# Patient Record
Sex: Female | Born: 1985 | Race: White | Hispanic: No | Marital: Married | Smoking: Former smoker
Health system: Southern US, Community
[De-identification: ages and names within clinical notes are randomized; demographics above are authoritative.]

## PROBLEM LIST (undated history)

## (undated) DIAGNOSIS — M2391 Unspecified internal derangement of right knee: Secondary | ICD-10-CM

## (undated) DIAGNOSIS — R112 Nausea with vomiting, unspecified: Secondary | ICD-10-CM

## (undated) DIAGNOSIS — F32A Depression, unspecified: Secondary | ICD-10-CM

## (undated) DIAGNOSIS — Z8661 Personal history of infections of the central nervous system: Secondary | ICD-10-CM

## (undated) DIAGNOSIS — Z8619 Personal history of other infectious and parasitic diseases: Secondary | ICD-10-CM

## (undated) DIAGNOSIS — H933X9 Disorders of unspecified acoustic nerve: Secondary | ICD-10-CM

## (undated) DIAGNOSIS — H903 Sensorineural hearing loss, bilateral: Secondary | ICD-10-CM

## (undated) DIAGNOSIS — J302 Other seasonal allergic rhinitis: Secondary | ICD-10-CM

## (undated) DIAGNOSIS — Z9889 Other specified postprocedural states: Secondary | ICD-10-CM

## (undated) DIAGNOSIS — H905 Unspecified sensorineural hearing loss: Secondary | ICD-10-CM

## (undated) DIAGNOSIS — O139 Gestational [pregnancy-induced] hypertension without significant proteinuria, unspecified trimester: Secondary | ICD-10-CM

## (undated) DIAGNOSIS — F329 Major depressive disorder, single episode, unspecified: Secondary | ICD-10-CM

## (undated) DIAGNOSIS — T7840XA Allergy, unspecified, initial encounter: Secondary | ICD-10-CM

## (undated) DIAGNOSIS — Z9621 Cochlear implant status: Secondary | ICD-10-CM

## (undated) HISTORY — PX: COCHLEAR IMPLANT: SUR684

## (undated) HISTORY — DX: Major depressive disorder, single episode, unspecified: F32.9

## (undated) HISTORY — DX: Depression, unspecified: F32.A

## (undated) HISTORY — DX: Allergy, unspecified, initial encounter: T78.40XA

## (undated) HISTORY — PX: WISDOM TOOTH EXTRACTION: SHX21

## (undated) HISTORY — DX: Personal history of infections of the central nervous system: Z86.61

## (undated) HISTORY — DX: Gestational (pregnancy-induced) hypertension without significant proteinuria, unspecified trimester: O13.9

## (undated) HISTORY — PX: OTHER SURGICAL HISTORY: SHX169

---
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
## 2007-02-03 ENCOUNTER — Ambulatory Visit: Payer: Self-pay | Admitting: Gastroenterology

## 2007-02-04 ENCOUNTER — Ambulatory Visit: Payer: Self-pay | Admitting: Gastroenterology

## 2007-02-04 ENCOUNTER — Ambulatory Visit (HOSPITAL_COMMUNITY): Admission: RE | Admit: 2007-02-04 | Discharge: 2007-02-04 | Payer: Self-pay | Admitting: Gastroenterology

## 2007-02-04 ENCOUNTER — Encounter (INDEPENDENT_AMBULATORY_CARE_PROVIDER_SITE_OTHER): Payer: Self-pay | Admitting: Specialist

## 2007-02-17 ENCOUNTER — Encounter (HOSPITAL_COMMUNITY): Admission: RE | Admit: 2007-02-17 | Discharge: 2007-03-19 | Payer: Self-pay | Admitting: Gastroenterology

## 2011-04-13 NOTE — Op Note (Signed)
Patricia Morse            ACCOUNT NO.:  [id number]   MEDICAL RECORD NO.:  [id number]          PATIENT TYPE:  AMB   LOCATION:  SDS                          FACILITY:  [facility]   PHYSICIAN:  Carolan Shiver, M.D.    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  She underwent middle  ear explorations and sealing of her oval and round windows bilaterally  during early childhood.  This seemed to stop the progression of the loss.  She originally wore hearing aids but failed and as she became older, it  became more clear that she actually was suffering from auditory neuropathy.  CT scanning of her temporal bones documented well-pneumatized temporal bones  bilaterally, and she was recommended for a multichannel cochlear implant.  The parents were uncertain about this, took a long time to decide and  eventually decided to proceed with a left cochlear implant.  Vestibular  function was tested and was found to be symmetric, approximately 30 degrees  maximum slow wave velocity bilaterally.  EBRT documented dyssynchronous wave  form patterns consistent with auditory neuropathy.  She underwent extensive  counseling preoperatively and Pneumovax vaccine on November 14, 2005, in  preparation for the procedure.   Risks and complications of multichannel cochlear implantation were explained  to [facility] and her parents, questions were invited and answered, and  informed consent was signed and witnessed.   JUSTIFICATION FOR OUTPATIENT SETTING:  This patient's' age and need for  general endotracheal anesthesia.   JUSTIFICATION FOR OVERNIGHT STAY:  1.  Twenty-three hours of observation of facial function status post      cochlear implant via a facial recess tympanomastoidectomy.  2.  IV hydration and pain control.   PREOPERATIVE DIAGNOSIS:  Severe bilateral sensorineural hearing loss  secondary to auditory neuropathy.   POSTOPERATIVE DIAGNOSIS:  Severe bilateral sensorineural hearing loss  secondary to auditory neuropathy.   OPERATION:  Clarion high-resolution 90K multichannel cochlear implant, left  ear (1-J electrode).   SURGEON:  Carolan Shiver, M.D.   ANESTHESIA:  General endotracheal, [place], M.D.   COMPLICATIONS:  None.   SUMMARY OF PROCEDURE:  After the patient was taken to the operating room,  she was placed in the supine position and a time-out was performed.  She was  then masked to sleep by general anesthesia without difficulty.  An IV was  begun and she was orally intubated, eyelids were taped shut, and she was  properly positioned and monitored.  Elbows and ankles were padded with foam  rubber.  A Foley catheter was inserted.  A pillow was placed beneath her  knees.  PAS stockings were applied, and her heels were elevated off the bed.  Hair in the left temporoparietal scalp area was then clipped and her hair  was taped and a stockinette cap was applied.  Her left external ear and  hemiface were then prepped with alcohol.  An inverted J cochlear implant  incision was then marked and infiltrated with 9 mL of 1% Xylocaine with  1:100,000 epinephrine.  Using a __________, the place for the internal  receiver was marked.  Several wire needle electrodes were then inserted into  the left orbicularis oris and oculi muscles, suprasternal notch and left  shoulder, and connected to a NIMS facial nerve monitor preamplifier.  The  patient's left ear and hemiface were then prepped with Betadine and draped  in the standard fashion for a  multichannel cochlear implant.   The inverted J incision was then created to phase this using a 15 blade.  A  scalp flap was elevated.  An anteriorly-based Palva flap was then elevated  and the mastoid cortex and posterior edge of the bony canal wall were  exposed.  Self-retaining retractors were placed.  The left mastoid was then  opened using cutting burs using continuous suction irrigation.  A complete  mastoid procedure was then performed, the antrum was opened, and the incus  was identified in the fossa incudis, along with the dome of the lateral  semicircular canal.  Digastric periosteum was identified in the digastric  tip.   The facial recess was then opened with a 2 mm cutting bur and a 1 mm diamond  bur.  The recess was found to be well-pneumatized.  Upon entering the  recess, however, I found that the tympanic membrane was actually medial to  the facial recess and, in fact, was at the level of the facial nerve.  The  tympanic membrane was then elevated and the middle ear was entered without  difficulty.  Scar tissue and adhesions around the incus and stapes were  lysed with a 5910 Beaver blade.  The patient was found to have almost no  identifiable round window niche.  There was a small dimple in the area where  the round window should have been.  A 1.5 mm diamond bur was then used to  create a cochleostomy.  The cochlea was entered without difficulty.  I could  see around to the first turn.  The cochleostomy was opened to the size of a  normal sizer for a 1-J electrode.  The cochleostomy was then packed with a  quarter cottonoid.   Attention was then turned to the skull, where a bony well was drilled  posterior to the mastoid cavity.  The well was drilled 3 mm deep and then a  trough was drilled between the well and the posterior mastoid cavity.  The  mastoid cavity lip was then undercut with cutting burrs.  All the bone was then smoothed.  The area was then copiously  irrigated with bacitracin-  containing saline.   A dummy internal receiver was then placed and two sets of holes were then  drilled on either side of the bony well for later suturing.  The dummy  internal receiver was then removed.  All bleeding was then controlled with  unipolar cautery.  The area was copiously irrigated with bacitracin-  containing saline.   A 5-0 Ethilon suture was then placed through the two sets of holes.   A Clarion high-resolution 90K multichannel cochlear implant, serial number  [id number], was then opened under saline and inspected.  The ICS appeared to be  intact.  It was then placed in the bony well and sutured to the bone loosely  with a 3-0 Ethilon suture.  The insertion tube was then placed on the  insertion tool with the slide at 2 o'clock.  The cottonoids were then  removed from the middle ear.  The cochlea was gently suctioned.  The  insertion tube was then placed in the basilar turn of the cochlea to the  first turn, and the implant was gently inserted without resistance into the  cochlea.  A full-length insertion was accomplished on the first effort.  Small squares of fascia were then packed around the cochleostomy to seal the  cochleostomy.   The patient's left ear canal was then cleaned of cerumen and debris and four-  quadrant ear canal blocks were performed with 1 mL of 1% Xylocaine with  1:100,000 epinephrine.  An atticotomy flap was then elevated.  Temporalis  fascia graft which had been previously harvested, pressed and dried, was  then trimmed and placed medial to the perforation, which was caused by the  TM being medial to the facial recess.  The ear canal was then packed with  Gelfoam just soaked in saline.   Attention was then turned again to the facial recess.  The fascia graft was  in good position.  The facial recess was then packed with fascia and muscle,  and this was also used to support the fascia graft medially.  The facial  recess  was then further packed with Gelfoam discs soaked in saline.  Three  large rectangles of Gelfoam soaked in saline were then used to stabilize the  electrode array in the mastoid cavity.  The Palva flap was then sutured to  cover the electrode array.  It was sutured into place using interrupted 3-0  chromic sutures.   A separate stab incision was then made in the hair line and a #7 Al Pimple drain was placed and connected to grenade suction.  The postauricular  incision was then closed in two layers using interrupted, inverted 3-0  chromics for a subcutaneous layer, and skin was closed with skin staples and  interrupted 4-0 Ethilons.  Prior to placing the staples, a C-arm  intraoperative x-ray was taken and showed the electrode array to be in good  position within the cochlea.  Skin staples were then placed and the incision  was painted with bacitracin ointment.  The area was padded with Adaptic, Telfa and cotton and a standard adult Glasscock mastoid dressing was applied  loosely in the standard fashion.  The patient's Foley catheter was removed.  She was awakened, extubated and transferred to her hospital bed.  She  appeared to tolerate both the general endotracheal anesthesia and the  procedures well and left the operating room in stable condition.   TOTAL FLUIDS:  2 L.   ESTIMATED BLOOD LOSS:  30 mL.   TOTAL URINE OUTPUT:  300 mL.   Sponge, needle and cotton ball counts were correct at the termination of the  procedure.  No specimens were sent to pathology.  The patient received  Clindamycin 600 mg IV at the beginning of the procedure, Zofran 4 mg IV at  the beginning and end of the procedure, and Decadron 10 mg IV.   Patricia Morse will be admitted to [facility] for overnight observation of  facial function and recovery after the cochlear implant.  If stable  overnight, she will be discharged with her parents on the morning of  November 16, 2005, and they will be instructed  to return her to my office on  November 22, 2005, at 4:30 p.m.  Discharge medications will include  Zithromax 500 mg p.o. daily x10 days, Vicodin #30 one to two p.o. q.6h.  p.r.n. pain, Phenergan suppositories 25 mg #2, one p.r. q.6h. p.r.n. nausea,  Cipro HC drops three drops left ear t.i.d. x7 days, and Valium 5 mg #20 one  to two p.o. t.i.d. p.r.n. vertigo.  She is to follow a regular diet, keep  her head elevated, avoid aspirin or aspirin products, avoid water exposure  to the left ear for the next two weeks.  Her mother will be given  instructions on how to wash her hair beauty parlor-style and avoid getting  the cochlear implant incision wet.   In approximately three weeks if she heals well without infection, she will  be connected to the external hardware and cochlear implant mapping will be  performed in the office.   SUMMARY:  Uncomplicated implantation of a high-resolution 90K device with a  1-J electrode, serial number [id number].  Full-length insertion was  accomplished.  Her tympanic membrane was found to be medial to the facial  recess and attached to the level of the facial nerve.  This necessitated a  type 1 tympanoplasty with a fascia graft to repair the perforation.  This  was done without difficulty, facial nerve stimulated at 0.5 mA during the  procedure.  The chorda tympani nerve was sacrificed during the procedure.           ______________________________  Carolan Shiver, M.D.     EMK/MEDQ  D:  11/15/2005  T:  11/17/2005  Job:  [id number]

## 2011-04-13 NOTE — Discharge Summary (Signed)
Patricia Morse, Patricia Morse NO.:  [id number]   MEDICAL RECORD NO.:  [id number]          PATIENT TYPE:  INP   LOCATION:  6126                         FACILITY:  [facility]   PHYSICIAN:  Carolan Shiver, M.D.    DATE OF BIRTH:  [birth year]   DATE OF ADMISSION:  11/15/2005  DATE OF DISCHARGE:  11/16/2005                                 DISCHARGE SUMMARY   ADMISSION DIAGNOSIS:  Profound bilateral sensory neural hearing loss  secondary to auditory neuropathy.   POSTOPERATIVE DIAGNOSIS:  Profound bilateral sensory neural hearing loss  secondary to auditory neuropathy.   OPERATION:  Clarion High-Res 90K multi-channel cochlear implant, left ear  (1J electrode).   SURGEON:  Carolan Shiver, MD.   ANESTHESIA:  General endotracheal.   COMPLICATIONS:  None.   DISCHARGE STATUS:  Stable.   SUMMARY OF HOSPITALIZATION:  Patricia Morse is a 25 year old white female,  who has developed progressive sensory neural hearing during early childhood.  This was thought to be secondary to an early viral encephalitis.  She  eventually developed auditory neuropathy, failed hearing aids, and was  recommended for a multi-channel cochlear implants.  The parents waited until  now for the device to be implanted prior to her beginning college.  Risks  and complications of multi-channel cochlear implantation, left ear, were  explained to the parents, questions were invited and answered, and informed  consent was signed and witnessed.  The parents had been in consultation with  Dr. Marilynne Halsted, a noted audiologist, and one of the first people to  describe auditory neuropathy.  Parents were counseled that there were no  guarantees that the patient would do well with the cochlear implant and that  it would take significant brain training for 12 to 24 months prior to  knowing whether she would be successful with a cochlear implant.  No  guarantees were made.   On November 15, 2005, the patient was  taken to the [facility] Main OR, Room #3,  and underwent an implantation of a Clarion High-Res 90K multi-channel  cochlear implant with a 1J electrode into her left ear.  The patient was  found to have abnormal temporal bone anatomy.  Her facial recess was a  normal recess, but her tympanic membrane was inserting  more medial than  normal.  The tympanic membrane was inserted, and the annulus was at the  level of her facial nerve rather than lateral to the facial nerve.  This  resulted in the facial recess being opened into the ear canal, dissecting  her tympanic membrane from the facial nerve, and then performing the  cochlear implant.  Later, a type 1 tympanoplasty was performed via the  transcanal approach to seal the opening.   A Clarion High-Res 90K multi-channel cochlear implant, serial [id number], was  inserted into the patient's left cochlea through a 2-mm cochleostomy, the  cochleostomy was sealed with fascia.  A full length insertion was  accomplished.  Intraoperative C-arm x-ray documented the proper placement of  the electrode array within the cochlea.   The patient had an uncomplicated, afebrile, postoperative course.  She had  some expected unsteadiness in the evening after the implantation and with  some nausea, however, by the first postoperative day she was ambulating,  awake, alert, and stable.  By the evening of the first postoperative day,  she wanted to go home.  She was ambulating, eating.  She was complaining of  some numbness and change in taste from the chordae tympani nerve being  sacrificed on the left side.  She was reassured that this was within normal  limits for the procedure.  Her implant incision was stable and dry without  signs of infection.  She had slight oozing from her left ear canal, which  was a combination of drops and Gelfoam deteriorating.  She had no nystagmus,  facial function was intact, she had stable vital signs.  She was recommended  for discharge  in the evening of November 16, 2005, with her parents.  She  will be instructed to return to my office on November 22, 2005, for followup  and removal of staples and sutures.  She is to follow a regular diet, quiet  into her activity, keep her head elevated, and avoid aspirin or aspirin  products, she is to avoid water exposure, A.S., x3 weeks.   DISCHARGE MEDICATIONS:  1.  Zithromax 500 mg p.o. daily x10 days.  2.  Vicodin 1 p.o. q.6h p.r.n. pain, #30.  3.  Phenergan suppositories 25 mg one PR q.6h p.r.n. nausea.  4.  Cipro-HC drops, 2 drops, A.S., t.i.d. x10 days.  5.  Valium 5 mg 1 p.o. t.i.d. p.r.n. vertigo.   INSTRUCTIONS:  She is to follow quiet indoor activity, and her mother is to  call [phone] for any postoperative problems related to her ear procedure.  If the patient heals well, approximately three weeks later she will have her  external processor connected and first cochlear implant now created.   ADMISSION LABORATORY DATA:  Hemoglobin 12.8, hematocrit 37, white blood cell  count 7100, platelet count 251,000.  PT was 14.2, PTT 31, INR 1.1.  Sodium  was 138, potassium 3.6, chloride 103, CO2 26, glucose 88, BUN 13, creatinine  0.8.  Liver function tests were within normal limits.  Urinalysis was  unremarkable.  The patient had a skull film taken intraoperatively and was  read as electronic device and wires described by Dr. Winferd Humphrey.  The film  does show proper placement of the electrode array within the cochlea.  The  patient did receive Pneumovax prior to the operation as a precaution against  Streptococcal pneumoniae meningitis.   TIME OF HOSPITALIZATION:  The patient was in [facility] OR, Room #3, the  PACU, and [facility], Room 6126.           ______________________________  Carolan Shiver, M.D.     EMK/MEDQ  D:  11/16/2005  T:  11/19/2005  Job:  [id number]

## 2011-04-13 NOTE — Op Note (Signed)
NAMEZAILEE, VALLELY            ACCOUNT NO.:  [id number]   MEDICAL RECORD NO.:  [id number]          PATIENT TYPE:  AMB   LOCATION:  DAY                           FACILITY:  [facility]   PHYSICIAN:  Kassie Mends, M.D.      DATE OF BIRTH:  [birth year]   DATE OF PROCEDURE:  02/04/2007  DATE OF DISCHARGE:  02/04/2007                               OPERATIVE REPORT   PROCEDURE:  Esophagogastroduodenoscopy with cold forceps biopsies.   INDICATIONS FOR PROCEDURE:  Ms. Annalee Genta is a 25 year old female with  persistent nausea and intermittent diarrhea for approximately one year.   FINDINGS:  1. Patchy erythema in the antrum.  Biopsies obtained to rule out      Helicobacter pylori infection.  2. Normal duodenal bulb and second portion of the duodenum.  Biopsies      obtained to evaluate for celiac sprue.  3. Normal esophagus, without evidence of Barrett's.   RECOMMENDATIONS:  1. No aspirin or NSAIDs three days.  2. Will call Ms. Shoemaker with biopsy results. If no celiac sprue,      the a GET will be ordered.  3. Continue the Zantac.   MEDICATIONS:  1. Demerol 100 mg IV.  2. Versed 9 mg IV.   DESCRIPTION OF PROCEDURE TECHNIQUE:  A physical examination was  performed.  An informed consent was obtained from the patient after  explaining the benefits, risks and alternatives to the procedure.  The  patient was connected to the monitor and placed in the left lateral  position.  Continuous oxygen was provided by nasal cannula and IV  medicines administered through the indwelling cannula.  After  administration of sedation, the patient's esophagus was intubated.  The  scope was advanced under direct visualization to the second portion of  the duodenum.  The scope was subsequently removed slowly by carefully  examining the color, texture, anatomy and integrity of the mucosa on the  way out.  The patient was recovered in the endoscopy suite and  discharged home in satisfactory  condition.      Kassie Mends, M.D.  Electronically Signed     SM/MEDQ  D:  02/06/2007  T:  02/08/2007  Job:  [id number]

## 2011-04-13 NOTE — H&P (Signed)
[name], PREISS NO.:  [id number]   MEDICAL RECORD NO.:  [id number]          PATIENT TYPE:  INP   LOCATION:  6126                         FACILITY:  [facility]   PHYSICIAN:  Carolan Shiver, M.D.    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 She underwent middle ear exploration and  sealing of the oval and round window fistulas bilaterally during early  childhood.  This seemed to stop the progression of her hearing loss.  However, she continued to lose hearing over the years.  She originally wore  hearing aids but failed and as she became older, it became clear that she  was actually suffering from progressive auditory neuropathy, again thought  to be due to the early viral encephalitis.  CT scanning of her temporal  bones documented well-pneumatized temporal bones bilaterally.  She was  recommended for an [employer] HiRes 90K cochlear implant.  The  parents were uncertain about cochlear implantation for [facility] and they  delayed until now.  She is post lingual.  Vestibular function was tested and  she was found to be symmetric with approximately 30 degrees of maximum slow  wave velocity bilaterally.  Auditory brain stem response test documented  dyssynchronous  wave form patterns consistent with auditory neuropathy.  Audiometric testing showed discrimination scores in the low 40% in both  ears.  She underwent extensive counseling preoperatively and Pneumovax  vaccine on November 14, 2005 in preparation for the procedure.   The parents have been in contact with Dr. Marilynne Halsted, a Ph.D.  audiologist, in [place] which is an expert in auditory neuropathy and he  agreed that [facility] was a candidate for multi-channel cochlear implantation.   Risks and complications of multi-channel cochlear implantation were  explained to [facility] and to her parents in greater detail.  Questions were  invited and answered and informed consent was signed and witnessed.  No  guarantees were made.  The cochlear implantation would be able to restore  any useful clinical hearing for her.  She elected to have her left ear  implanted.   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   PAST SURGICAL HISTORY:  None reported.   ALLERGIES:  PENICILLIN.   SOCIAL HISTORY:  She is in high school and lives with her father.  She does  not use alcohol or tobacco products.   REVIEW OF SYSTEMS:  Positive for some decrease in her vision and feeling by  Dr. Ellison Carwin at one time that she might have Friedreich's ataxia.  However, this work-up  eventually was negative.   PHYSICAL EXAMINATION:  VITAL SIGNS:  Temperature 97.3, pulse 78 and regular,  respiratory rate 16, blood pressure 120/67.  Height 64-1/4 inches.  Weight 54.9 kg.  GENERAL:  She was a well-developed, thin white female in no acute distress.  She had no recognizable syndromes or patterns of malformation.  She was post  lingual and was a good speech reader.  HEENT:  Facial function was intact.  There was no nystagmus.  Pupils equal,  round and reactive to light and accommodation.  External ears and canals  were stable.  Tympanic membranes were clear and mobile bilaterally.  Nose  negative.  Oral cavity, lips, tongue, palate normal.  NECK:  Supple.  No cervical lymphadenopathy or thyromegaly.  CHEST:  Clear.  HEART:  Normal  sinus rhythm without murmurs, rubs or gallops.  BREASTS:  Not performed.  ABDOMEN:  Negative.  GENITALIA/RECTAL:  Not performed.  EXTREMITIES:  Negative.  NEUROLOGIC:  Positive for bilateral profound sensorineural hearing loss  secondary to auditory neuropathy. She also had some gait disturbance  previously noted by Dr. Billie Ruddy.   STUDIES:  Audiometric testing showed speech reception thresholds in the  moderate to severe range with discrimination scores below 40% in both ears.  Auditory brain stem response testing documented synchronous wave form  patterns consistent with auditory neuropathy.  CT scan of the temporal bones  showed well-pneumatized temporal bones bilaterally.  Vestibular testing also  showed symmetric caloric function in both ears of approximately 30 degrees  maximum slow wave velocity AU.   IMPRESSION:  1.  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  2.  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  3.  History of decreased vision and some gait disturbance.   RECOMMENDATIONS:  Ryeleigh was recommended for an  Education officer, museum 90K multi-channel cochlear implant, left ear, under general  endotracheal anesthesia.  Three hours [facility] main OR.  Again risks and  complications were explained to Benkelman and her parents.  Questions were  invited and answered.  Informed consent was signed and witnessed.  She did  receive preoperative Pneumovax vaccine for meningitis prophylaxis.  No  guarantees were made to [facility] or to her parents that she would respond to  electronic stimulation of her left cochlea and obtain any clinical hearing.           ______________________________  Carolan Shiver, M.D.     EMK/MEDQ  D:  12/31/2005  T:  12/31/2005  Job:  [id number]

## 2011-04-13 NOTE — H&P (Signed)
Patricia Morse         ACCOUNT NO.:  [id number]   MEDICAL RECORD NO.:  [id number]          PATIENT TYPE:  AMB   LOCATION:  DAY                           FACILITY:  [facility]   PHYSICIAN:  Kassie Mends, M.D.      DATE OF BIRTH:  03/11/1986   DATE OF ADMISSION:  DATE OF DISCHARGE:  LH                              HISTORY & PHYSICAL   REASON FOR VISIT:  Nausea.   HISTORY OF PRESENT ILLNESS:  [name] is a 25 year old female with a  significant past medical history of profound bilateral sensory neural  hearing loss, secondary to auditory neuropathy.  She had a multi-channel  cochlear implant placed in the left ear in December of 2006.  She  reports that postoperatively she had a significant amount of nausea and  vomiting for two weeks after her surgery.  She states she could not eat  anything because food would just make her nauseated.  She states the  nausea actually lasted until January and February of 2007.  She reports  getting back to normal.  She states that in June or July she began to  become nauseated again.  She thought that perhaps it was related to her  birth control.  She does not remember having any problems with a flu-  like or viral illness.  She has a few bowel movements per week.  Over  the last two to three weeks, she has been nauseated every day.  Her  nausea is relieved with Zantac 150 mg daily.  She saw her primary  physician which is an Urgent Care Facility, and an H. Pylori serology  was obtained.  She was placed on antibiotic therapy four times a day,  and after completing a 10-day course felt better.  Three days after her  antibiotics were stopped, her symptoms returned.  Her course of  antibiotic therapy was complicated by thrush and a yeast infection.  She  does not tolerate lactose products very well.  She did, however, take  yogurt because of the thrush and the yeast.  She tried Prilosec for her  symptoms, but that does not help.  She reports that  if she eats hot  dogs, sandwiches, and certain foods, her symptoms get a whole lot worse.  When she is in closed spaces and she develops this nausea, it causes her  to have panic attacks.  Her heart rate increases, and she  hyperventilates.  She hates being sick.  She does not throw up, but  she does have the urge to have a bowel movement.  Her Urgent Care  physician did check her thyroid, and it is fine.  She has lost weight  during this period of time, but she attributed that to her depression.  She has had a significant amount of stress over the last 6 to 9 months.  Her nausea does get worse when she is in a moving vehicle, especially if  she is not driving.  She states that the bus rides to [facility]  really make her nauseated, and if she is in a car and not driving, she  is  extremely nauseated.  She has intermittent constipation and diarrhea.  Some of her constipation she attributes to having to use a public  bathroom in the dorm.  Taking Zantac over the last two weeks has made  her symptoms better.  Her nausea is no longer keeping her up at  nighttime, and her stomach is no longer churning.  Pepto Bismol also  helps.  She also complains of burping and hiccups a few times a week.  She is unable to quantify exactly how many times a week she has  diarrhea.   PAST MEDICAL HISTORY:  Per the HPI.   PAST SURGICAL HISTORY:  Wisdom teeth extraction, otherwise per the HPI.   ALLERGIES:  PENICILLIN.   MEDICATIONS:  1. Zantac 75 mg as needed.  2. Pepto Bismol as needed.  3. Celexa daily.   FAMILY HISTORY:  Her grandfather had prostate cancer.  Her mother had  melanoma.  She denies any family history of breast, uterine or ovarian  cancer.   SOCIAL HISTORY:  She is not married, and she has no children.  She  recently broke up with her boyfriend who is in high school.  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  This semester is a little  more challenging than last  semester.  She does not smoke.  She rarely  drinks alcohol.   REVIEW OF SYSTEMS:  As per the HPI, otherwise all systems are negative.   PHYSICAL EXAMINATION:  VITAL SIGNS:  Weight 112 pounds, height 5 foot, 5  inches, BMI 18.6 (slightly underweight).  Temperature 98.1, blood  pressure 100/68, pulse 68.GENERAL:  She is in no apparent distress,  alert and oriented x4.  HEENT:  Atraumatic, normocephalic.  Pupils equal  and reactive to light.  Mouth:  No oral lesions.  Posterior pharynx  without erythema or exudate.  NECK:  Full range of motion, no lymphadenopathy.LUNGS:  Clear to  auscultation bilaterally.  CARDIOVASCULAR:  Regular rhythm, no murmur,  normal S1 and S2.ABDOMEN:  Bowel sounds are present, soft, nontender,  nondistended, no rebound or guarding, unable to appreciate  hepatosplenomegaly, abdominal bruits or pulsatile masses.  EXTREMITIES:  Without cyanosis, clubbing or edema.  NEURO:  She has no new focal  neurologic deficits.   ASSESSMENT:  [name] is a 25 year old female who has persistent  nausea since June of 2007.  Her nausea is relieved with Zantac and  exacerbated by food, as well as motion.  The differential diagnosis  includes gastritis caused by eosinophilic infiltration.  The nausea  associated with diarrhea may be due to celiac sprue.  The persistent  nausea may be caused by post-anesthesia gastroparesis.  She has a low  likelihood of her nausea due to non-gastrointestinal cause such as  middle ear process.   PLAN:  1. I recommend beginning with an EGD with biopsies of the stomach and      the duodenum on tomorrow at 12:30.  I will call her at [phone]      with the results of the biopsies.  2. If the biopsies are negative, then I would suggest that she follow      up with her neurologist to assess whether or not a middle ear      process could be causing her nausea, especially since it is     sometimes associated with motion.  3. She may follow up  with me as needed.  She should continue to use      the Zantac, since  it provides symptomatic relief.      Kassie Mends, M.D.  Electronically Signed     SM/MEDQ  D:  02/03/2007  T:  02/03/2007  Job:  [id number]

## 2011-11-15 ENCOUNTER — Ambulatory Visit (INDEPENDENT_AMBULATORY_CARE_PROVIDER_SITE_OTHER): Payer: 59

## 2011-11-15 DIAGNOSIS — M79609 Pain in unspecified limb: Secondary | ICD-10-CM

## 2012-01-23 ENCOUNTER — Ambulatory Visit (INDEPENDENT_AMBULATORY_CARE_PROVIDER_SITE_OTHER): Payer: 59 | Admitting: Family Medicine

## 2012-01-23 VITALS — BP 115/76 | HR 86 | Temp 97.7°F | Resp 16 | Ht 66.0 in | Wt 146.0 lb

## 2012-01-23 DIAGNOSIS — H6980 Other specified disorders of Eustachian tube, unspecified ear: Secondary | ICD-10-CM

## 2012-01-23 DIAGNOSIS — J0100 Acute maxillary sinusitis, unspecified: Secondary | ICD-10-CM

## 2012-01-23 DIAGNOSIS — J329 Chronic sinusitis, unspecified: Secondary | ICD-10-CM

## 2012-01-23 MED ORDER — FLUTICASONE PROPIONATE 50 MCG/ACT NA SUSP: 2.0000 | Freq: Every day | NASAL | Status: DC

## 2012-01-23 MED ORDER — AZITHROMYCIN 250 MG PO TABS: ORAL_TABLET | ORAL | Status: AC

## 2012-01-23 NOTE — Progress Notes (Signed)
  Subjective:    Patient ID: Patricia Morse, female    DOB: 09-17-1986, 26 y.o.   MRN: [id number]  Sinusitis This is a new problem. The current episode started in the past 7 days. The problem has been gradually worsening since onset. There has been no fever. The pain is mild. Associated symptoms include congestion, coughing, ear pain and sinus pressure. Past treatments include oral decongestants. The treatment provided no relief.  Cough Associated symptoms include ear pain.  Otalgia  Associated symptoms include coughing.    Using afrin, Dayquil cold and sinus, Nyquil, delsym Nanny for child who has been sick  PMH/ Cochlear implant (L) ear; progressive hearing loss after encephalitis as a child   Review of Systems  HENT: Positive for ear pain, congestion and sinus pressure.   Respiratory: Positive for cough.        Objective:   Physical Exam  Constitutional: She appears well-developed and well-nourished.  HENT:  Head: Normocephalic and atraumatic.  Right Ear: Tympanic membrane is retracted.  Nose: Mucosal edema and rhinorrhea present.  Mouth/Throat: Posterior oropharyngeal erythema present. Posterior oropharyngeal edema: and yellow pnd.  Neck: Neck supple. No thyromegaly present.  Cardiovascular: Normal rate, regular rhythm and normal heart sounds.   Pulmonary/Chest: Effort normal and breath sounds normal.  Neurological: She is alert.  Skin: Skin is warm.          Assessment & Plan:   1. Sinusitis  azithromycin (ZITHROMAX) 250 MG tablet  2. ETD (eustachian tube dysfunction)  fluticasone (FLONASE) 50 MCG/ACT nasal spray  3. Hearing loss     Anticipatory guidance. Follow up if no better or worsening of symptoms

## 2012-01-26 ENCOUNTER — Telehealth: Payer: Self-pay

## 2012-01-26 NOTE — Telephone Encounter (Signed)
PT STATES SHE HAD TAKEN ALL HER ANTIBOTICS  NO BETTER WOULD LIKE SOMETHING ELSE   [facility]  [phone]

## 2012-01-27 NOTE — Telephone Encounter (Signed)
Mucinex D and give it more time-antibiotics still in system.

## 2012-01-27 NOTE — Telephone Encounter (Signed)
Pt is having sinus pressure, HA, and congestion.  She was told to call back if no better.  She finished antibiotic and no better.  Can we recommend anything for her

## 2012-01-29 NOTE — Telephone Encounter (Signed)
LMOM WITH NOTES

## 2012-03-07 ENCOUNTER — Encounter (HOSPITAL_BASED_OUTPATIENT_CLINIC_OR_DEPARTMENT_OTHER): Payer: Self-pay | Admitting: *Deleted

## 2012-03-10 ENCOUNTER — Encounter (HOSPITAL_BASED_OUTPATIENT_CLINIC_OR_DEPARTMENT_OTHER): Payer: Self-pay | Admitting: *Deleted

## 2012-03-10 NOTE — Progress Notes (Signed)
NPO AFTER MN. ARRIVES AT 1015. NEEDS HG AND URINE PREG. WILL TAKE ALLEGRA AM OF SURG. W/ SIP OF WATER.

## 2012-03-13 ENCOUNTER — Ambulatory Visit (HOSPITAL_BASED_OUTPATIENT_CLINIC_OR_DEPARTMENT_OTHER): Payer: 59 | Admitting: Anesthesiology

## 2012-03-13 ENCOUNTER — Encounter (HOSPITAL_BASED_OUTPATIENT_CLINIC_OR_DEPARTMENT_OTHER): Payer: Self-pay | Admitting: Anesthesiology

## 2012-03-13 ENCOUNTER — Encounter (HOSPITAL_BASED_OUTPATIENT_CLINIC_OR_DEPARTMENT_OTHER)
Admission: RE | Disposition: A | Discharge status: Home / Self Care | Payer: Self-pay | Source: Ambulatory Visit | Attending: Orthopedic Surgery

## 2012-03-13 ENCOUNTER — Ambulatory Visit (HOSPITAL_BASED_OUTPATIENT_CLINIC_OR_DEPARTMENT_OTHER)
Admission: RE | Admit: 2012-03-13 | Discharge: 2012-03-13 | Disposition: A | Discharge status: Home / Self Care | Payer: 59 | Source: Ambulatory Visit | Attending: Orthopedic Surgery | Admitting: Orthopedic Surgery

## 2012-03-13 ENCOUNTER — Encounter (HOSPITAL_BASED_OUTPATIENT_CLINIC_OR_DEPARTMENT_OTHER): Payer: Self-pay | Admitting: *Deleted

## 2012-03-13 DIAGNOSIS — Z79899 Other long term (current) drug therapy: Secondary | ICD-10-CM | POA: Insufficient documentation

## 2012-03-13 DIAGNOSIS — H903 Sensorineural hearing loss, bilateral: Secondary | ICD-10-CM | POA: Insufficient documentation

## 2012-03-13 DIAGNOSIS — M2240 Chondromalacia patellae, unspecified knee: Secondary | ICD-10-CM | POA: Insufficient documentation

## 2012-03-13 DIAGNOSIS — M6750 Plica syndrome, unspecified knee: Secondary | ICD-10-CM | POA: Insufficient documentation

## 2012-03-13 HISTORY — DX: Unspecified internal derangement of right knee: M23.91

## 2012-03-13 HISTORY — DX: Personal history of other infectious and parasitic diseases: Z86.19

## 2012-03-13 HISTORY — DX: Cochlear implant status: Z96.21

## 2012-03-13 HISTORY — PX: CHONDROPLASTY: SHX5177

## 2012-03-13 HISTORY — DX: Sensorineural hearing loss, bilateral: H90.3

## 2012-03-13 HISTORY — DX: Unspecified sensorineural hearing loss: H90.5

## 2012-03-13 HISTORY — DX: Other specified postprocedural states: Z98.890

## 2012-03-13 HISTORY — PX: KNEE ARTHROSCOPY: SHX127

## 2012-03-13 HISTORY — DX: Disorders of unspecified acoustic nerve: H93.3X9

## 2012-03-13 HISTORY — DX: Nausea with vomiting, unspecified: R11.2

## 2012-03-13 HISTORY — DX: Other seasonal allergic rhinitis: J30.2

## 2012-03-13 HISTORY — DX: Personal history of infections of the central nervous system: Z86.61

## 2012-03-13 LAB — POCT HEMOGLOBIN-HEMACUE: Hemoglobin: 13.6 g/dL (ref 12.0–15.0)

## 2012-03-13 SURGERY — ARTHROSCOPY, KNEE
Anesthesia: Monitor Anesthesia Care | Site: Knee | Laterality: Right | Wound class: Clean

## 2012-03-13 MED ORDER — HYDROCODONE-ACETAMINOPHEN 5-325 MG PO TABS: 1.0000 | ORAL_TABLET | ORAL | Status: AC | PRN

## 2012-03-13 MED ORDER — MIDAZOLAM HCL 2 MG/2ML IJ SOLN
2.0000 mg | Freq: Once | INTRAMUSCULAR | Status: AC
  Administered 2012-03-13: 2 mg via INTRAVENOUS

## 2012-03-13 MED ORDER — ONDANSETRON HCL 4 MG/2ML IJ SOLN
INTRAMUSCULAR | Status: DC | PRN
  Administered 2012-03-13: 4 mg via INTRAVENOUS

## 2012-03-13 MED ORDER — FENTANYL CITRATE 0.05 MG/ML IJ SOLN
INTRAMUSCULAR | Status: DC | PRN
  Administered 2012-03-13 (×2): 25 ug via INTRAVENOUS
  Administered 2012-03-13: 50 ug via INTRAVENOUS

## 2012-03-13 MED ORDER — FENTANYL CITRATE 0.05 MG/ML IJ SOLN: 25.0000 ug | INTRAMUSCULAR | Status: DC | PRN

## 2012-03-13 MED ORDER — ASPIRIN EC 325 MG PO TBEC: 325.0000 mg | DELAYED_RELEASE_TABLET | Freq: Two times a day (BID) | ORAL | Status: AC

## 2012-03-13 MED ORDER — MIDAZOLAM HCL 5 MG/5ML IJ SOLN
INTRAMUSCULAR | Status: DC | PRN
  Administered 2012-03-13 (×2): 1 mg via INTRAVENOUS

## 2012-03-13 MED ORDER — PROPOFOL 10 MG/ML IV EMUL
INTRAVENOUS | Status: DC | PRN
  Administered 2012-03-13: 75 ug/kg/min via INTRAVENOUS

## 2012-03-13 MED ORDER — BUPIVACAINE-EPINEPHRINE 0.25% -1:200000 IJ SOLN
INTRAMUSCULAR | Status: DC | PRN
  Administered 2012-03-13: 20 mL

## 2012-03-13 MED ORDER — DEXAMETHASONE SODIUM PHOSPHATE 4 MG/ML IJ SOLN
INTRAMUSCULAR | Status: DC | PRN
  Administered 2012-03-13: 10 mg via INTRAVENOUS

## 2012-03-13 MED ORDER — FENTANYL CITRATE 0.05 MG/ML IJ SOLN
100.0000 ug | Freq: Once | INTRAMUSCULAR | Status: AC
  Administered 2012-03-13: 100 ug via INTRAVENOUS

## 2012-03-13 MED ORDER — CLINDAMYCIN PHOSPHATE 600 MG/50ML IV SOLN
600.0000 mg | INTRAVENOUS | Status: AC
  Administered 2012-03-13: 600 mg via INTRAVENOUS

## 2012-03-13 MED ORDER — CHLORHEXIDINE GLUCONATE 4 % EX LIQD: 60.0000 mL | Freq: Once | CUTANEOUS | Status: DC

## 2012-03-13 MED ORDER — HYDROCODONE-ACETAMINOPHEN 5-325 MG PO TABS: 1.0000 | ORAL_TABLET | ORAL | Status: DC | PRN

## 2012-03-13 MED ORDER — LIDOCAINE HCL (CARDIAC) 20 MG/ML IV SOLN
INTRAVENOUS | Status: DC | PRN
  Administered 2012-03-13: 30 mg via INTRAVENOUS

## 2012-03-13 MED ORDER — LACTATED RINGERS IV SOLN
INTRAVENOUS | Status: DC
  Administered 2012-03-13 (×3): via INTRAVENOUS

## 2012-03-13 MED ORDER — PROMETHAZINE HCL 25 MG/ML IJ SOLN: 6.2500 mg | INTRAMUSCULAR | Status: DC | PRN

## 2012-03-13 MED ORDER — SODIUM CHLORIDE 0.9 % IR SOLN
Status: DC | PRN
  Administered 2012-03-13: 4

## 2012-03-13 MED ORDER — BUPIVACAINE HCL (PF) 0.25 % IJ SOLN
INTRAMUSCULAR | Status: DC | PRN
  Administered 2012-03-13: 30 mg

## 2012-03-13 MED ORDER — METHOCARBAMOL 500 MG PO TABS: 500.0000 mg | ORAL_TABLET | Freq: Four times a day (QID) | ORAL | Status: AC | PRN

## 2012-03-13 MED ORDER — MELOXICAM 7.5 MG PO TABS: 7.5000 mg | ORAL_TABLET | Freq: Two times a day (BID) | ORAL | Status: AC

## 2012-03-13 SURGICAL SUPPLY — 29 items
BANDAGE ELASTIC 6 VELCRO ST LF (GAUZE/BANDAGES/DRESSINGS) ×2 IMPLANT
BLADE CUDA SHAVER 3.5 (BLADE) ×2 IMPLANT
CANISTER SUCT LVC 12 LTR MEDI- (MISCELLANEOUS) ×2 IMPLANT
CANISTER SUCTION 2500CC (MISCELLANEOUS) ×2 IMPLANT
CLOTH BEACON ORANGE TIMEOUT ST (SAFETY) ×2 IMPLANT
DRAPE ARTHROSCOPY W/POUCH 114 (DRAPES) ×2 IMPLANT
DRSG EMULSION OIL 3X3 NADH (GAUZE/BANDAGES/DRESSINGS) ×2 IMPLANT
DURAPREP 26ML APPLICATOR (WOUND CARE) ×2 IMPLANT
GLOVE BIO SURGEON STRL SZ7.5 (GLOVE) ×2 IMPLANT
GLOVE BIOGEL M 7.0 STRL (GLOVE) ×1 IMPLANT
GLOVE BIOGEL PI IND STRL 6.5 (GLOVE) IMPLANT
GLOVE BIOGEL PI INDICATOR 6.5 (GLOVE) ×1
GLOVE ECLIPSE 6.0 STRL STRAW (GLOVE) ×1 IMPLANT
GLOVE INDICATOR 7.0 STRL GRN (GLOVE) ×1 IMPLANT
GOWN PREVENTION PLUS LG XLONG (DISPOSABLE) ×4 IMPLANT
IV NS IRRIG 3000ML ARTHROMATIC (IV SOLUTION) ×4 IMPLANT
KNEE WRAP E Z 3 GEL PACK (MISCELLANEOUS) ×2 IMPLANT
PACK ARTHROSCOPY DSU (CUSTOM PROCEDURE TRAY) ×2 IMPLANT
PACK BASIN DAY SURGERY FS (CUSTOM PROCEDURE TRAY) ×2 IMPLANT
PADDING CAST ABS 6INX4YD NS (CAST SUPPLIES) ×1
PADDING CAST ABS COTTON 6X4 NS (CAST SUPPLIES) IMPLANT
SET ARTHROSCOPY TUBING (MISCELLANEOUS) ×2
SET ARTHROSCOPY TUBING LN (MISCELLANEOUS) ×1 IMPLANT
SPONGE GAUZE 4X4 12PLY (GAUZE/BANDAGES/DRESSINGS) ×2 IMPLANT
SUT ETHILON 4 0 PS 2 18 (SUTURE) ×2 IMPLANT
SYR CONTROL 10ML LL (SYRINGE) ×2 IMPLANT
TOWEL OR 17X24 6PK STRL BLUE (TOWEL DISPOSABLE) ×2 IMPLANT
WAND 90 DEG TURBOVAC W/CORD (SURGICAL WAND) ×1 IMPLANT
WATER STERILE IRR 500ML POUR (IV SOLUTION) ×2 IMPLANT

## 2012-03-13 NOTE — Discharge Instructions (Signed)
  Post Anesthesia Home Care Instructions  Activity: Get plenty of rest for the remainder of the day. A responsible adult should stay with you for 24 hours following the procedure.  For the next 24 hours, DO NOT: -Drive a car -Operate machinery -Drink alcoholic beverages -Take any medication unless instructed by your physician -Make any legal decisions or sign important papers.  Meals: Start with liquid foods such as gelatin or soup. Progress to regular foods as tolerated. Avoid greasy, spicy, heavy foods. If nausea and/or vomiting occur, drink only clear liquids until the nausea and/or vomiting subsides. Call your physician if vomiting continues.  Special Instructions/Symptoms: Your throat may feel dry or sore from the anesthesia or the breathing tube placed in your throat during surgery. If this causes discomfort, gargle with warm salt water. The discomfort should disappear within 24 hours.  Discharge Instructions After Orthopedic Procedures:  *You may feel tired and weak following your procedure. It is recommended that you limit physical activity for the next 24 hours and rest at home for the remainder of today and tomorrow. *No strenuous activity should be started without your doctor's permission.  Elevate the extremity that you had surgery on to a level above your heart. This should continue for 48 hours or as instructed by your doctor.  If you had hand, arm or shoulder surgery you should move your fingers frequently unless otherwise instructed by your doctor.  If you had foot, knee or leg surgery you should wiggle your toes frequently unless otherwise instructed by your doctor.  Follow your doctor's exact instructions for activity at home. Use your home equipment as instructed. (Crutches, hard shoes, slings etc.)  Limit your activity as instructed by your doctor.  Report to your doctor should any of the following occur: 1. Extreme swelling of your fingers or toes. 2. Inability  to wiggle your fingers or toes. 3. Coldness, pale or bluish color in your fingers or toes. 4. Loss of sensation, numbness or tingling of your fingers or toes. 5. Unusual smell or odor from under your dressing or cast. 6. Excessive bleeding or drainage from the surgical site. 7. Pain not relieved by medication your doctor has prescribed for you. 8. Cast or dressing too tight (do not get your dressing or cast wet or put anything under          your dressing or cast.)  *Do not change your dressing unless instructed by your doctor or discharge nurse. Then follow exact instructions.  *Follow labeled instructions for any medications that your doctor may have prescribed for you. *Should any questions or complications develop following your procedure, PLEASE CONTACT YOUR DOCTOR.      

## 2012-03-13 NOTE — Anesthesia Preprocedure Evaluation (Addendum)
Anesthesia Evaluation  Patient identified by MRN, date of birth, ID band Patient awake    Reviewed: Allergy & Precautions, H&P , NPO status , Patient's Chart, lab work & pertinent test results  History of Anesthesia Complications (+) PONV  Airway Mallampati: II TM Distance: >3 FB Neck ROM: Full    Dental No notable dental hx.    Pulmonary former smoker breath sounds clear to auscultation  Pulmonary exam normal       Cardiovascular negative cardio ROS  Rhythm:Regular Rate:Normal     Neuro/Psych H/O viral encephalitis with bilateral hearing loss. negative psych ROS   GI/Hepatic negative GI ROS, Neg liver ROS,   Endo/Other  negative endocrine ROS  Renal/GU negative Renal ROS  negative genitourinary   Musculoskeletal negative musculoskeletal ROS (+)   Abdominal   Peds negative pediatric ROS (+)  Hematology negative hematology ROS (+)   Anesthesia Other Findings   Reproductive/Obstetrics negative OB ROS                           Anesthesia Physical Anesthesia Plan  ASA: I  Anesthesia Plan: MAC   Post-op Pain Management:    Induction:   Airway Management Planned: Simple Face Mask  Additional Equipment:   Intra-op Plan:   Post-operative Plan:   Informed Consent:   Plan Discussed with:   Anesthesia Plan Comments: (Discussed r/b general versus MAC/knee block. She prefers MAC/knee block.)        Anesthesia Quick Evaluation

## 2012-03-13 NOTE — H&P (Signed)
CC- Patricia Morse is a 26 y.o. female who presents with right knee pain.    HPI- . We have spent a lot of time working this pain up.  She is unable to get a MRI due to cochlear implants.  We have tried and subsequently been unsuccessful at managing symptoms with therapy, medications, even cortisone injections all with recurrence and or persistence of her symptoms  Knee Pain: Patient presents for follow up on a knee problem involving the  right knee. Onset of the symptoms was several months ago. Inciting event: associated with running program. Current symptoms include giving out and pain located medially. Pain is aggravated by going up and down stairs, lateral movements, pivoting, running and walking.  Patient has had no prior knee problems. Evaluation to date: plain films: normal. Treatment to date: avoidance of offending activity, corticosteroid injection which was somewhat effective, prescription NSAIDS which are not very effective and PT which was not very effective.  Past Medical History  Diagnosis Date  . Bilateral sensorineural hearing loss     SECONDARY AUDITORY NEUROPATHY  . Auditory neuropathy BILATERAL  . 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 Internal derangement of right knee   . PONV (postoperative nausea and vomiting) POSTOP COCHLEAR  IMPLANT 2006  . Seasonal allergies     Past Surgical History  Procedure Date  . Cochlear implant 11-15-2005  (AGE 37)    LEFT EAR  (CLARION HIGH-RESOLUTION 90K MULTICHANNEL,  1-J ELECTRODE)  . Bilateral middle ear exploration/ sealing of oval and round window fistulas 1999 (AGE 17)    PERILYMPH FISTULAS  . Wisdom tooth extraction AGE 56    DENTAL OFFICE    Prior to Admission medications   Medication Sig Start Date End Date Taking? Authorizing Provider  acetaminophen (TYLENOL) 500 MG tablet Take 500 mg by mouth every 6 (six) hours as needed.    Yes Historical Provider, MD  escitalopram (LEXAPRO) 10 MG tablet Take 10 mg by mouth daily.   Yes Historical Provider, MD  fexofenadine (ALLEGRA) 180 MG tablet Take 180 mg by mouth every morning.   Yes Historical Provider, MD  Multiple Vitamin (MULTIVITAMIN) tablet Take 1 tablet by mouth daily.   Yes Historical Provider, MD  norethindrone-ethinyl estradiol (JUNEL FE,GILDESS FE,LOESTRIN FE) 1-20 MG-MCG tablet Take 1 tablet by mouth daily.   Yes Historical Provider, MD  Probiotic Product (PROBIOTIC FORMULA PO) Take by mouth daily.   Yes Historical Provider, MD    normal exam, no swelling, tenderness, instability; ligaments intact, FROM, normal contralateral knee exam, only significant findings were persistent medial tenderness  Physical Examination: General appearance - alert, well appearing, and in no distress Mental status - alert, oriented to person, place, and time, normal mood, behavior, speech, dress, motor activity, and thought processes Ears - bilateral TM's and external ear canals normal, history of bilateral cochlear implants Chest - clear to auscultation, no wheezes, rales or rhonchi, symmetric air entry Heart - normal rate, regular rhythm, normal S1, S2, no murmurs, rubs, clicks or gallops, normal rate and regular rhythm Musculoskeletal - abnormal exam of right with medial sided tenderness, no erthyrema, no large effusion Extremities - peripheral pulses normal, no pedal edema, no clubbing or cyanosis   Asessment/Plan--- Left knee medial meniscal tear versus hypertrophic plicae- -   Plan left knee arthroscopy for diagnostic and operative purposes and debridement. Procedure risks and  potential complications were discussed with patient including the fact that there may be no pathology found and she may have persistent symptoms, she elects to proceed. Goals are decreased pain and increased function with a high likelihood of achieving both

## 2012-03-13 NOTE — Progress Notes (Signed)
Assessed pt after arrival to PACU 2

## 2012-03-13 NOTE — Brief Op Note (Signed)
03/13/2012  10:42 AM  PATIENT:  Patricia Morse  26 y.o. female  PRE-OPERATIVE DIAGNOSIS:  RIGHT KNEE INTERNAL DERANGEMENT  POST-OPERATIVE DIAGNOSIS:  1. Right knee plicae, 2. 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  PROCEDURE:  Procedure(s) (LRB): ARTHROSCOPY KNEE (Right) KNEE ARTHROSCOPY WITH LATERAL RELEASE (Right) CHONDROPLASTY (Right) of the patella KNEE ARTHROSCOPY WITH EXCISION PLICA (Right)  SURGEON:  Surgeon(s) and Role:    * Shelda Pal, MD - Primary  PHYSICIAN ASSISTANT: No  ANESTHESIA:   local and IV sedation  EBL:  Total I/O In: 400 [I.V.:400] Out: -   BLOOD ADMINISTERED:none  DRAINS: none   LOCAL MEDICATIONS USED:  MARCAINE with epinepherine , 20 cc  SPECIMEN:  No Specimen  DISPOSITION OF SPECIMEN:  N/A  COUNTS:  YES  TOURNIQUET:  * No tourniquets in log *  DICTATION: .Other Dictation: Dictation Number [id number]  PLAN OF CARE: Discharge to home after PACU  PATIENT DISPOSITION:  PACU - hemodynamically stable.   Delay start of Pharmacological VTE agent (>24hrs) due to surgical blood loss or risk of bleeding: yes

## 2012-03-13 NOTE — Anesthesia Postprocedure Evaluation (Signed)
  Anesthesia Post-op Note  Patient: Patricia Morse  Procedure(s) Performed: Procedure(s) (LRB): ARTHROSCOPY KNEE (Right) KNEE ARTHROSCOPY WITH LATERAL RELEASE (Right) CHONDROPLASTY (Right) KNEE ARTHROSCOPY WITH EXCISION PLICA (Right)  Patient Location: PACU  Anesthesia Type: General  Level of Consciousness: awake and alert   Airway and Oxygen Therapy: Patient Spontanous Breathing  Post-op Pain: mild  Post-op Assessment: Post-op Vital signs reviewed, Patient's Cardiovascular Status Stable, Respiratory Function Stable, Patent Airway and No signs of Nausea or vomiting  Post-op Vital Signs: stable  Complications: No apparent anesthesia complications

## 2012-03-13 NOTE — Transfer of Care (Signed)
Immediate Anesthesia Transfer of Care Note  Patient: Patricia Morse  Procedure(s) Performed: Procedure(s) (LRB): ARTHROSCOPY KNEE (Right) KNEE ARTHROSCOPY WITH LATERAL RELEASE (Right) CHONDROPLASTY (Right) KNEE ARTHROSCOPY WITH EXCISION PLICA (Right)  Patient Location: Patient transported to PACU with oxygen via face mask at 4 Liters / Min  Anesthesia Type: MAC  Level of Consciousness: awake and alert   Airway & Oxygen Therapy: Patient Spontanous Breathing and Patient connected to face mask oxygen  Post-op Assessment: Report given to PACU RN and Post -op Vital signs reviewed and stable  Post vital signs: Reviewed and stable  Complications: No apparent anesthesia complications

## 2012-03-13 NOTE — Anesthesia Procedure Notes (Addendum)
Anesthesia Regional Block:    Pre-Anesthetic Checklist: ,, timeout performed, Correct Patient, Correct Site, Correct Laterality, Correct Procedure, Correct Position, site marked, Risks and benefits discussed, Surgical consent,  Pre-op evaluation,  At surgeon's request  Laterality: Right and Lower  Prep: chloraprep       Needles:  Injection technique: Single-shot      Additional Needles:  Narrative:  Start time: 03/13/2012 9:15 AM  Performed by: Personally  Anesthesiologist: denenny  Additional Notes: Knee intrarticular injection and trocar ports injected. Patient tolerated well.   Procedure Name: MAC Date/Time: 03/13/2012 10:00 AM Performed by: Fran Lowes Pre-anesthesia Checklist: Patient identified, Timeout performed, Emergency Drugs available, Suction available and Patient being monitored Patient Re-evaluated:Patient Re-evaluated prior to inductionOxygen Delivery Method: Simple face mask

## 2012-03-14 ENCOUNTER — Encounter (HOSPITAL_BASED_OUTPATIENT_CLINIC_OR_DEPARTMENT_OTHER): Payer: Self-pay | Admitting: Orthopedic Surgery

## 2012-03-14 NOTE — Op Note (Signed)
NAMETANISHIA, LEMASTER           ACCOUNT NO.:  [id number]  MEDICAL RECORD NO.:  [id number]  LOCATION:  PERIOP                       FACILITY:  [facility]  PHYSICIAN:  Madlyn Frankel. Charlann Boxer, M.D.  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  POSTOPERATIVE DIAGNOSES/FINDINGS: 1. Small medial plica noted. 2. 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  PROCEDURE: 1. Right knee diagnostic and operative arthroscopy with plaque     excision medially. 2. Lateral release. 3. Patellar apex chondroplasty debridement of unstable loose fragments     of cartilage.  SURGEON:  Madlyn Frankel. Charlann Boxer, M.D.  ASSISTANT:  Surgical team.  ANESTHESIA:  Local plus IV sedation.  FINDINGS:  As above.  SPECIMENS:  None.  COMPLICATIONS:  None.  DRAINS:  None.  INDICATIONS FOR PROCEDURE:  Tyresha is a 26 year old female, who I have been following for several months after aggravation of her right knee. She failed conservative measures of medications, cortisone injections, and physical therapy.  Given the fact that she was unable to perform a MRI to evaluate for internal derangement, she at this point wished to proceed with surgical intervention.  Risks of persistent discomfort postop based on intraoperative findings as well as risk of infection, DVT, were all reviewed.  Consent was obtained for benefit of pain relief.  PROCEDURE IN DETAIL:  The patient was brought to operative theater. Once adequate anesthesia, preoperative antibiotics, Ancef administered, she was positioned supine with the right leg in a leg holder.  Right lower extremity was then prepped and draped in sterile fashion.  A time- out was performed  identifying the patient, planned procedure, and extremity.  A standard inferomedial, superomedial, inferolateral portals were utilized.  Diagnostic evaluation of the knee revealed the above findings.  In the medial aspect of the knee with a 3.5 Cuda shaver, removed the synovium or plica along this medial side, opening up the medial aspect of the knee.  In the medial side of the knee, I also used a probe and examined the meniscus and found it was completely intact without evidence of any complicating features.  The ACL was noted to be intact.  There was some longitudinal splits in it, but it was otherwise intact and stable.  Laterally, the joint was intact as well.  Anteriorly, as I further performed synovectomy along the medial and anterior aspect of knee to further define the patellofemoral chondromalacia, particularly on the patellar apex and lateral facet with some stable flaps of cartilage noted.  The 3.5 Cuda shaver was utilized to debride this back to a stable level.  No evidence of associated trochlear damage.  In the extended position, the patella was noted to be laterally tilting. For that reason, we opened up an ArthroCare wand and performed the lateral release.  Care was taken to avoid any vascular structures.  Following the lateral release, I reexamined the knee to make certain that there was no other evidence of damage within  the knee and no chondral fragments.  Once I was satisfied, the knee was now free of any pathology.  Instrumentation was removed.  Portal sites reapproximated with 4-0 nylon.  The knee was then injected at the end of the case with 20 cc of 0.25% Marcaine with epinephrine.  Her knee was then cleaned and wrapped into a sterile bulky dressing. She was brought to the recovery room in stable condition, tolerating the procedure well.  We will see her back in the office in routine followup in about 10 days. She is to keep her wounds dry until sutures  are out.  We will get her into physical therapy to work on quadriceps, particularly vastus medialis strengthening, probably some inserts for shoe wear.     Madlyn Frankel Charlann Boxer, M.D.     MDO/MEDQ  D:  03/13/2012  T:  03/14/2012  Job:  [id number]

## 2012-03-30 ENCOUNTER — Ambulatory Visit (INDEPENDENT_AMBULATORY_CARE_PROVIDER_SITE_OTHER): Payer: 59 | Admitting: Emergency Medicine

## 2012-03-30 VITALS — BP 113/72 | HR 98 | Temp 98.0°F | Resp 16 | Ht 65.0 in | Wt 146.0 lb

## 2012-03-30 DIAGNOSIS — R232 Flushing: Secondary | ICD-10-CM

## 2012-03-30 DIAGNOSIS — M25569 Pain in unspecified knee: Secondary | ICD-10-CM

## 2012-03-30 DIAGNOSIS — F329 Major depressive disorder, single episode, unspecified: Secondary | ICD-10-CM

## 2012-03-30 DIAGNOSIS — F339 Major depressive disorder, recurrent, unspecified: Secondary | ICD-10-CM

## 2012-03-30 DIAGNOSIS — F32A Depression, unspecified: Secondary | ICD-10-CM

## 2012-03-30 MED ORDER — ESCITALOPRAM OXALATE 20 MG PO TABS: 20.0000 mg | ORAL_TABLET | Freq: Every day | ORAL | Status: DC

## 2012-03-30 NOTE — Progress Notes (Signed)
  Subjective:    Patient ID: Patricia Morse, female    DOB: [birth year], 26 y.o.   MRN: [id number]  HPI patient enters for followup of her depression she is doing very well. 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 She is going out. She has no sexual side effects related to the Lexapro. She recently had right knee surgery she still has some slippage of the kneecap but overall is doing well. She also had a question regarding flushing of the face after drinking alcohol I reassured her about this    Review of Systems     Objective:   Physical Exam  HENT:  Head: Normocephalic.  Neck: No tracheal deviation present. No thyromegaly present.  Cardiovascular: Normal rate and regular rhythm.   Psychiatric: She has a normal mood and affect. Her behavior is normal. Judgment and thought content normal.          Assessment & Plan:    Patient would like to try an increased dose of Lexapro in that she feels like recently her depression has somewhat worsened. She is currently living at home and feels that if she was able to move out of the house she would improve.

## 2012-08-01 ENCOUNTER — Ambulatory Visit (INDEPENDENT_AMBULATORY_CARE_PROVIDER_SITE_OTHER): Payer: 59 | Admitting: Physician Assistant

## 2012-08-01 VITALS — BP 110/64 | HR 81 | Temp 97.9°F | Resp 16 | Ht 65.0 in | Wt 149.0 lb

## 2012-08-01 DIAGNOSIS — B359 Dermatophytosis, unspecified: Secondary | ICD-10-CM

## 2012-08-01 MED ORDER — KETOCONAZOLE 2 % EX CREA: TOPICAL_CREAM | Freq: Every day | CUTANEOUS | Status: DC

## 2012-08-01 NOTE — Progress Notes (Signed)
  Subjective:    Patient ID: Patricia Morse, female    DOB: 15-Apr-1986, 26 y.o.   MRN: [id number]  HPI Rash R volar wrist X 1 week.  No new soaps/detergents. She has been using an athlete's foot cream on it which has helped some.  She has 2 cats that are indoor/outdoor. +pruritic  Review of Systems  All other systems reviewed and are negative.       Objective:   Physical Exam  Nursing note and vitals reviewed. Constitutional: She is oriented to person, place, and time. She appears well-developed and well-nourished.  HENT:  Head: Normocephalic and atraumatic.  Cardiovascular: Normal rate, regular rhythm and normal heart sounds.   Pulmonary/Chest: Effort normal and breath sounds normal.  Neurological: She is alert and oriented to person, place, and time.  Skin:       2X3cm well-circumscribed erythematous lesion with scale, distal R forearm, volar surface        Assessment & Plan:  Ringworm- see Rx.

## 2012-08-20 ENCOUNTER — Telehealth: Payer: Self-pay

## 2012-08-20 NOTE — Telephone Encounter (Signed)
ERROR-- PT LOST SERVICE

## 2012-08-23 ENCOUNTER — Ambulatory Visit (INDEPENDENT_AMBULATORY_CARE_PROVIDER_SITE_OTHER): Payer: 59 | Admitting: Family Medicine

## 2012-08-23 VITALS — BP 126/88 | HR 74 | Temp 98.3°F | Resp 16 | Ht 65.0 in | Wt 148.6 lb

## 2012-08-23 DIAGNOSIS — L259 Unspecified contact dermatitis, unspecified cause: Secondary | ICD-10-CM

## 2012-08-23 DIAGNOSIS — L309 Dermatitis, unspecified: Secondary | ICD-10-CM

## 2012-08-23 MED ORDER — CLOBETASOL PROPIONATE 0.05 % EX OINT: TOPICAL_OINTMENT | Freq: Two times a day (BID) | CUTANEOUS | Status: DC

## 2012-08-23 NOTE — Patient Instructions (Signed)
Use cream twice daily. Return if not improving.

## 2012-08-23 NOTE — Progress Notes (Signed)
S: Small spot of dry skin on right wrist that itches.  Past 4-5 weeks.  Was a little scaly at first.  Never an active border.  Tried antifungal cream without help.  Scratches it and has some little excoriations on it.  No jewlery.    O; Eczematoid rash right writs  A:  Eczema  P; Clobetasol.  If not better 2-3 weeks suggest a punch biopsy.

## 2012-10-09 ENCOUNTER — Telehealth: Payer: Self-pay

## 2012-10-09 NOTE — Telephone Encounter (Signed)
Patient says she is gaining weight on lexapro and it is not helping her depression. She would like to know if there is anything else she can and if she should stop taking the lexapro.  Best [phone]

## 2012-10-09 NOTE — Telephone Encounter (Signed)
Left message for patient to advise of need for office visit. Kathrin Folden

## 2012-10-09 NOTE — Telephone Encounter (Signed)
I think it would be best if she came in for an office visit to discuss this - her last OV to talk about her depression was in May.

## 2012-10-10 ENCOUNTER — Ambulatory Visit (INDEPENDENT_AMBULATORY_CARE_PROVIDER_SITE_OTHER): Payer: 59 | Admitting: Emergency Medicine

## 2012-10-10 VITALS — BP 126/76 | HR 91 | Temp 98.2°F | Resp 16 | Ht 65.0 in | Wt 151.0 lb

## 2012-10-10 DIAGNOSIS — F329 Major depressive disorder, single episode, unspecified: Secondary | ICD-10-CM

## 2012-10-10 DIAGNOSIS — F32A Depression, unspecified: Secondary | ICD-10-CM | POA: Insufficient documentation

## 2012-10-10 DIAGNOSIS — R635 Abnormal weight gain: Secondary | ICD-10-CM

## 2012-10-10 DIAGNOSIS — E669 Obesity, unspecified: Secondary | ICD-10-CM

## 2012-10-10 DIAGNOSIS — H9190 Unspecified hearing loss, unspecified ear: Secondary | ICD-10-CM | POA: Insufficient documentation

## 2012-10-10 MED ORDER — DULOXETINE HCL 30 MG PO CPEP: 30.0000 mg | ORAL_CAPSULE | Freq: Every day | ORAL | Status: DC

## 2012-10-10 NOTE — Progress Notes (Signed)
  Subjective:    Patient ID: Patricia Morse, female    DOB: 1986/01/18, 26 y.o.   MRN: [id number]  HPI patient enters for consideration of changing her antidepressant. She has been on Lexapro for years. 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 She was on Effexor for a while but developed a headache and so this was stopped. She has minimal sexual drive while on Lexapro but her main complaint is of her weight gain .    Review of Systems     Objective:   Physical Exam  Patient's weight is 151. Thyroid is not enlarged her chest is clear heart regular rate no murmurs.      Assessment & Plan:  Patient experiencing 1 and weight gain on Lexapro. We'll taper her off of Lexapro over the next 3 weeks. He a week she is to start Cymbalta 3o one a day. She is to see me in 6 weeks after starting the Cymbalta.Marland Kitchen

## 2012-10-10 NOTE — Patient Instructions (Addendum)
Please take your Lexapro 20 mg one half tablet a day for one week. The second week take one half tablet every other day. The third week take one half tablet every third day. After 3 weeks start Cymbalta one a day see me after you've been on Cymbalta for 6 week

## 2012-11-20 ENCOUNTER — Encounter: Payer: 59 | Attending: Emergency Medicine | Admitting: *Deleted

## 2012-11-20 ENCOUNTER — Encounter: Payer: Self-pay | Admitting: *Deleted

## 2012-11-20 DIAGNOSIS — Z713 Dietary counseling and surveillance: Secondary | ICD-10-CM | POA: Insufficient documentation

## 2012-11-20 DIAGNOSIS — E669 Obesity, unspecified: Secondary | ICD-10-CM | POA: Insufficient documentation

## 2012-11-20 NOTE — Patient Instructions (Signed)
Plan: Aim for 3 Carb Choices (45 grams) per meal +/- 1 either way Aim for 0-1 carb per snack if hungry Consider reading food labels for total carbohydrate of foods Consider using APPS on your phone for nutrition and exercise resources Continue with cardio at the gym 3-5 days a week or dancing at home if you wish.

## 2012-11-20 NOTE — Progress Notes (Signed)
  Medical Nutrition Therapy:  Appt start time: 1145 end time:  1245.  Assessment:  Primary concerns today: patient here for assistance with weight management. 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 History of depression. Hard of hearing,  Lives with Mom for now, eat a lot of take out foods. They usually eat their own foods, they do eat together. Has been to gym historically, 150 minutes of cardio per week with ecliptical and bike, bicycle outside, walking at the [employer]. Had knee surgery in April, then started going to gym after that. Gets frustrated with slow results and stops.  MEDICATIONS: see list   DIETARY INTAKE:  Usual eating pattern includes 3 meals and 0-3 snacks per day.  Everyday foods include good variety of all food groups.  Avoided foods include none stated.    24-hr recall:  B ( AM): Nutrigrain bar OR whole grain cereal with skim milk OR low calorie muffin from [employer], Star[employer]'s latte  Snk ( AM): none or [place] yogurt L ( PM): fast food OR sandwich without fries sometimes OR sub sandwich OR frozen dinner OR soup with diet soda (Coke Zero) Snk ( PM): same as AM or mixed nuts D ( PM): take out - [place] or [place], pasta like spaghetti, sweet tea, Coke Zero or wine Snk ( PM): none Beverages: sweet tea, Coke Zero, wine, water  Usual physical activity: gym with cardio 30-45 minutes 3-4 times a week.  Estimated energy needs: 1500 calories 170 g carbohydrates 112 g protein 42 g fat  Progress Towards Goal(s):  In progress.   Nutritional Diagnosis:  NI-1.5 Excessive energy intake As related to activity level.  As evidenced by BMI of 25.6    Intervention:  Nutrition counseling for weight loss using Carb Counting initiated. Discussed Carb Counting and reading food labels, and benefits of continuing with increased activity. Demonstrated value of Calorie Brooke [employer]are APP to provide more nutrition information from restaurants and workout APPS  that are also available  Plan: Aim for 3 Carb Choices (45 grams) per meal +/- 1 either way Aim for 0-1 carb per snack if hungry Consider reading food labels for total carbohydrate of foods Consider using APPS on your phone for nutrition and exercise resources Continue with cardio at the gym 3-5 days a week or dancing at home if you wish.  Handouts given during visit include: Carb Counting and Food Label handouts Meal Plan Card  Monitoring/Evaluation:  Dietary intake, exercise, reading food labels, and body weight prn. She will be changing her insurance after first of the year, needs to check on coverage before making the next appointment.

## 2012-12-15 ENCOUNTER — Ambulatory Visit (INDEPENDENT_AMBULATORY_CARE_PROVIDER_SITE_OTHER): Payer: 59 | Admitting: Family Medicine

## 2012-12-15 ENCOUNTER — Telehealth: Payer: Self-pay

## 2012-12-15 VITALS — BP 119/75 | HR 87 | Temp 98.3°F | Resp 18 | Ht 65.0 in | Wt 152.0 lb

## 2012-12-15 DIAGNOSIS — Z9229 Personal history of other drug therapy: Secondary | ICD-10-CM

## 2012-12-15 DIAGNOSIS — Z0389 Encounter for observation for other suspected diseases and conditions ruled out: Secondary | ICD-10-CM

## 2012-12-15 NOTE — Progress Notes (Signed)
.   Tuberculosis Risk Questionnaire  1. Were you born outside the [place] in one of the following parts of the world:    [place], [place], [place], [place] or [place]?  No  2. Have you traveled outside the [place] and lived for more than one month in one of the following parts of the world:  [place], [place], [place], [place] or [place]?  No  3. Do you have a compromised immune system such as from any of the following conditions:  HIV/AIDS, organ or bone marrow transplantation, diabetes, immunosuppressive   medicines (e.g. Prednisone, Remicaide), leukemia, lymphoma, cancer of the   head or neck, gastrectomy or jejunal bypass, end-stage renal disease (on   dialysis), or silicosis?  No    4. Have you ever done one of the following:    Used crack cocaine, injected illegal drugs, worked or resided in jail or prison,   worked or resided at a homeless shelter, or worked as a Research scientist (physical sciences) in   direct contact with patients?  Yes--worked as Agricultural consultant at [employer]  5. Have you ever been exposed to anyone with infectious tuberculosis?  No   Tuberculosis Symptom Questionnaire  Do you currently have any of the following symptoms?  1. Unexplained cough lasting more than 3 weeks? No  Unexplained fever lasting more than 3 weeks. No   3. Night Sweats (sweating that leaves the bedclothes and sheets wet)   No  4. Shortness of Breath No  5. Chest Pain No  6. Unintentional weight loss  No  7. Unexplained fatigue (very tired for no reason) No

## 2012-12-15 NOTE — Telephone Encounter (Signed)
It is OK for her to have a note to this effect

## 2012-12-15 NOTE — Telephone Encounter (Signed)
PT WAS SEEN TODAY, BUT SHE NEEDS A NOTE FROM DR. DAUB FOR [employer]CHO[employer] STATING THAT SHE CAN NOT GET A FLU SHOT DUE TO THE FACT THAT SHE HAD ENCEPHALITIS AS A CHILD.  SHE NEEDS THIS BY Wednesday EVENING.  PLEASE CALL WHEN READY FOR PICK UP [phone]

## 2012-12-15 NOTE — Telephone Encounter (Signed)
Can we provide a note to this effect? I can write it if you approve. Amy

## 2012-12-15 NOTE — Telephone Encounter (Signed)
Is okay for her to have a note to this effect

## 2012-12-15 NOTE — Telephone Encounter (Signed)
Printed, will have you sign and then call her.

## 2012-12-19 NOTE — Telephone Encounter (Signed)
Note at front desk for pick up patient advised.

## 2013-03-03 ENCOUNTER — Ambulatory Visit (INDEPENDENT_AMBULATORY_CARE_PROVIDER_SITE_OTHER): Payer: 59 | Admitting: Internal Medicine

## 2013-03-03 ENCOUNTER — Ambulatory Visit: Payer: 59

## 2013-03-03 VITALS — BP 122/76 | HR 94 | Temp 98.8°F | Resp 16 | Ht 65.0 in | Wt 150.0 lb

## 2013-03-03 DIAGNOSIS — R519 Headache, unspecified: Secondary | ICD-10-CM

## 2013-03-03 DIAGNOSIS — R51 Headache: Secondary | ICD-10-CM

## 2013-03-03 LAB — POCT CBC
Hemoglobin: 14.5 g/dL (ref 12.2–16.2)
MCH, POC: 28.1 pg (ref 27–31.2)
MCV: 85.2 fL (ref 80–97)
RBC: 5.16 M/uL (ref 4.04–5.48)
WBC: 6.7 10*3/uL (ref 4.6–10.2)

## 2013-03-03 MED ORDER — PREDNISONE 50 MG PO TABS: 50.0000 mg | ORAL_TABLET | Freq: Every day | ORAL | Status: DC

## 2013-03-03 NOTE — Patient Instructions (Signed)
Take the steroids as directed.  Tylenol or Advil for pain relief.  Continue Zyrtec for allergy relief.  Plenty of fluids (water is best!)  Come back in Friday afternoon so we can recheck you and assure that you're improving.  If any symptoms acutely worsen before then or if you develop new symptoms please come back in sooner.

## 2013-03-03 NOTE — Progress Notes (Signed)
Subjective:    Patient ID: Patricia Morse, female    DOB: 1986/10/10, 27 y.o.   MRN: [id number]  HPI   [name] is a very pleasant 27 yr old female here with concern for sinus infection.  States she has an extreme headache that woke her from sleep at 5am this morning.  Took some otc migraine medicine that barely touched the pain.  The pain is localized above the right eye.  Feels lots of pressure behind right eye.  Lots of pressure when she sneezes.  Pain worse when she bends over or suddenly moves the head.  Endorses some ear pressure, but no other URI symptoms.  Has had sinus infections before, but always preceded by cold symptoms.  She denies nausea, vomiting, photophobia, or phonophobia.  Denies migraine hx but states she sometimes gets bad headaches.  Does endorse that she was "kind of seeing double" last evening before bed, attributed this to fatigue.  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      Review of Systems  Constitutional: Negative for fever and chills.  HENT: Positive for ear pain (right). Negative for congestion, sore throat and rhinorrhea.   Eyes: Positive for visual disturbance (doube vision last night). Negative for photophobia.  Respiratory: Negative.   Cardiovascular: Negative.   Gastrointestinal: Negative.   Musculoskeletal: Negative.   Skin: Negative.   Neurological: Positive for dizziness and headaches. Negative for syncope.       Objective:   Physical Exam  Vitals reviewed. Constitutional: She is oriented to person, place, and time. She appears well-developed and well-nourished. No distress.  HENT:  Head: Normocephalic and atraumatic.  Right Ear: Tympanic membrane and ear canal normal.  Nose: Right sinus exhibits no maxillary sinus tenderness and no frontal sinus tenderness. Left sinus exhibits no maxillary sinus  tenderness and no frontal sinus tenderness.  Left ear with cochlear implant  Eyes: Conjunctivae and EOM are normal. Pupils are equal, round, and reactive to light.  Neck: Neck supple.  Cardiovascular: Normal rate, regular rhythm and normal heart sounds.  Exam reveals no gallop and no friction rub.   No murmur heard. Pulmonary/Chest: Effort normal and breath sounds normal. She has no wheezes. She has no rales.  Lymphadenopathy:    She has no cervical adenopathy.  Neurological: She is alert and oriented to person, place, and time. No cranial nerve deficit.  Skin: Skin is warm and dry.  Psychiatric: She has a normal mood and affect. Her behavior is normal.     Filed Vitals:   03/03/13 1339  BP: 122/76  Pulse: 94  Temp: 98.8 F (37.1 C)  Resp: 16     [facility] reading (PRIMARY) by  Dr. Merla Riches - no air fluid level      Assessment & Plan:  Frontal headache - Plan: POCT CBC, DG Sinuses Complete, predniSONE (DELTASONE) 50 MG tablet   [name] is a very pleasant 27 yr old female here with right sided frontal headache.  Pt describes significant pressure sensation, but denies other URI symptoms.  CBC is normal.  Sinus plain films do not reveal an air fluid level.  There are no focal neurologic deficits on exam, which is reassuring.  Discussed with pt that I think this may represent a sinusitis.  I think it's reasonable to try an antibiotic and steroid to reduce inflammation.  Pt would  like to hold off on the antibiotic as she often develops yeast infections with their use.  She would like to try steroids alone first.  Will do 3 days of prednisone and see her back on 03/06/13 to reassess.  Encouraged pt to continue daily Zyrtec to help with any allergic component.  Push fluids.  Tylenol/Advil for pain relief.  If any symptoms are acutely worsening or if new symptoms developed, pt will RTC or proceed to the ED.    I have reviewed and agree with documentation. Robert P. Merla Riches, M.D.

## 2013-06-08 ENCOUNTER — Ambulatory Visit (INDEPENDENT_AMBULATORY_CARE_PROVIDER_SITE_OTHER): Payer: 59 | Admitting: Emergency Medicine

## 2013-06-08 VITALS — BP 110/72 | HR 86 | Temp 98.0°F | Resp 17 | Ht 65.5 in | Wt 162.0 lb

## 2013-06-08 DIAGNOSIS — R635 Abnormal weight gain: Secondary | ICD-10-CM

## 2013-06-08 DIAGNOSIS — F329 Major depressive disorder, single episode, unspecified: Secondary | ICD-10-CM

## 2013-06-08 DIAGNOSIS — E669 Obesity, unspecified: Secondary | ICD-10-CM | POA: Insufficient documentation

## 2013-06-08 DIAGNOSIS — F32A Depression, unspecified: Secondary | ICD-10-CM

## 2013-06-08 LAB — POCT CBC
Granulocyte percent: 56.7 %G (ref 37–80)
MCV: 86.5 fL (ref 80–97)
MID (cbc): 0.5 (ref 0–0.9)
POC Granulocyte: 3.7 (ref 2–6.9)
POC LYMPH PERCENT: 36.4 %L (ref 10–50)
Platelet Count, POC: 271 10*3/uL (ref 142–424)
RDW, POC: 15 %

## 2013-06-08 LAB — GLUCOSE, POCT (MANUAL RESULT ENTRY): POC Glucose: 70 mg/dl (ref 70–99)

## 2013-06-08 MED ORDER — DULOXETINE HCL 20 MG PO CPEP: ORAL_CAPSULE | ORAL | Status: DC

## 2013-06-08 MED ORDER — DULOXETINE HCL 30 MG PO CPEP: ORAL_CAPSULE | ORAL | Status: DC

## 2013-06-08 NOTE — Patient Instructions (Addendum)
Your taper schedule for your cymbalta  is as follows  Week one take  Mon. 30 mg Tues 30 mg Wed 20mg  Thurs 30mg  Fri 30mg  sat 20 mg Sunday 30 mg total week dose 190 Week Two  Mon. 30mg  Tues 20 mg Wed 30 mg Thurs 20mg  Friday 30 mg Friday 20 mg Saturday 30 mg total week dose 180 Week Three Mon 30mg  Tues 20mg  Wed 30mg  Thurs 20 mg Friday 20 mgSat 30mg  sun 20mg  total week dose 170 Week Four Mon 20mg  Tues 30mg  Wed 20 mg thurs 20mg  Friday 20mg  sat 30 mg Sunday 20 total week dose 160 Week Five Mon 20 mg Tues 20 mg Wed 20 mg Thurs 20 mg Friday 20 mg Sat 30 mg Sun 20 mg total weekly dose 150 Week Six Mon 20mg  Tues 20 mg Wed 20 mg Thurs 20 mg Friday 20 mg Sat 20 mg Sunday 20 mg weekly dose 140mg  Week Seven Monday 20 mg Tues 20mg  Wed 20 mg Thurs 20mg  Fri 20mg  Sat none Sunday 20mg  Weekly dose 120 mg Week Eight Mon 20mg  tues 20 mg Wed none Thurs 20mg  Friday None Sat 20mg  Sunday 20mg  weekly dose 100 mg Week Nine Mon 20 mg Tues none Wed 20mg  Thurs none Friday 20mg  Sat none Sun 20mg  weekly dose 80 mg Week Ten Monday none Tues 20mg  Wed none Thurs 20mg  Friday none Saturday none Sun 20mg  total weekly dose 60mg  Week Eleven Mon none Tues 20mg  Wed none Thurs none Friday 20mg  Sat none Sunday none total weekly dose 40 mg Week Twelve take 20mg  midweek, then discontinue your cymbalta

## 2013-06-08 NOTE — Progress Notes (Signed)
  Subjective:    Patient ID: Patricia Morse, female    DOB: 11-Jun-1986, 27 y.o.   MRN: [id number]  HPI Patient is here because she want to get off of Cymbalta she has gain 13 lbs since November 2013  She havent been able to have an organism since she been on medicine she's in a health new relationship since April and is happy haven't had any depression since she's been with him When she dont take the cymbalta she gets nausous and dizzy In [employer]     Review of Systems     Objective:   Physical Exam patient is alert and cooperative but extremely overweight. Thyroid not enlarged to palpation Results for orders placed in visit on 06/08/13  POCT CBC      Result Value Range   WBC 6.6  4.6 - 10.2 K/uL   Lymph, poc 2.4  0.6 - 3.4   POC LYMPH PERCENT 36.4  10 - 50 %L   MID (cbc) 0.5  0 - 0.9   POC MID % 6.9  0 - 12 %M   POC Granulocyte 3.7  2 - 6.9   Granulocyte percent 56.7  37 - 80 %G   RBC 4.95  4.04 - 5.48 M/uL   Hemoglobin 13.7  12.2 - 16.2 g/dL   HCT, POC 78.2  95.6 - 47.9 %   MCV 86.5  80 - 97 fL   MCH, POC 27.7  27 - 31.2 pg   MCHC 32.0  31.8 - 35.4 g/dL   RDW, POC 21.3     Platelet Count, POC 271  142 - 424 K/uL   MPV 11.0  0 - 99.8 fL  GLUCOSE, POCT (MANUAL RESULT ENTRY)      Result Value Range   POC Glucose 70  70 - 99 mg/dl         Assessment & Plan:  It is certainly possible her weight gain is secondary to the Cymbalta . She was advised to taper her Cymbalta slowly over the next 12 weeks. I did check her thyroid also appear

## 2013-06-09 LAB — T4, FREE: Free T4: 1.02 ng/dL (ref 0.80–1.80)

## 2013-06-09 LAB — TSH: TSH: 1.539 u[IU]/mL (ref 0.350–4.500)

## 2013-07-23 ENCOUNTER — Telehealth: Payer: Self-pay

## 2013-07-23 DIAGNOSIS — R110 Nausea: Secondary | ICD-10-CM

## 2013-07-23 MED ORDER — ONDANSETRON HCL 4 MG PO TABS: 4.0000 mg | ORAL_TABLET | Freq: Three times a day (TID) | ORAL | Status: DC | PRN

## 2013-07-23 NOTE — Telephone Encounter (Signed)
Spoke with pt advised Rx sent to pharmacy. Advised to RTC if she is not better.

## 2013-07-23 NOTE — Telephone Encounter (Signed)
I have sent her in some Zofran but I am concerned that this is not what is causing her nausea.

## 2013-07-23 NOTE — Telephone Encounter (Signed)
Spoke with pt, she is tapering off her Cymbalta and she is very nauseated. Can we call in something for her? Please advise.

## 2013-08-13 ENCOUNTER — Telehealth: Payer: Self-pay | Admitting: Radiology

## 2013-08-13 NOTE — Telephone Encounter (Signed)
Patient states she is feeling depressed. She is trying to wean off the Cymbalta. She has been taking 80 mg this week, she took Evern Bio and will take today. She states she is not feeling like herself. The Cymbalta did help her depression, and it didn't have the side effects the other antidepressants caused. She did c/o decreased libido with this medication. Please advise.

## 2013-08-13 NOTE — Telephone Encounter (Signed)
To Dr Cleta Alberts

## 2013-08-13 NOTE — Telephone Encounter (Signed)
Patricia Morse and ask her to come in to see me either tomorrow or Wednesday next week and we can discuss what she wants to do regarding her depression symptoms.

## 2013-08-14 ENCOUNTER — Ambulatory Visit (INDEPENDENT_AMBULATORY_CARE_PROVIDER_SITE_OTHER): Payer: 59 | Admitting: Emergency Medicine

## 2013-08-14 VITALS — BP 110/70 | HR 98 | Temp 99.3°F | Resp 18 | Ht 66.0 in | Wt 162.0 lb

## 2013-08-14 DIAGNOSIS — F411 Generalized anxiety disorder: Secondary | ICD-10-CM

## 2013-08-14 DIAGNOSIS — F329 Major depressive disorder, single episode, unspecified: Secondary | ICD-10-CM

## 2013-08-14 DIAGNOSIS — F32A Depression, unspecified: Secondary | ICD-10-CM

## 2013-08-14 MED ORDER — DULOXETINE HCL 20 MG PO CPEP: 20.0000 mg | ORAL_CAPSULE | Freq: Every day | ORAL | Status: DC

## 2013-08-14 NOTE — Telephone Encounter (Signed)
Thanks. Called to advise. Patient could not hear me.

## 2013-08-14 NOTE — Progress Notes (Signed)
  Subjective:    Patient ID: Patricia Morse, female    DOB: 1986/03/10, 27 y.o.   MRN: [id number]  HPI    Review of Systems     Objective:   Physical Exam        Assessment & Plan:

## 2013-08-14 NOTE — Progress Notes (Signed)
  Subjective:    Patient ID: Patricia Morse, female    DOB: 1986/07/20, 27 y.o.   MRN: [id number]  HPI 27 YO female patient returns to the clinic to follow up with Dr. Cleta Alberts. She has been weening herself off her Cymbalta. She has a 12 week plan, she is in week 10. She states that she is starting to feel depressed. She denies any increase in stress in her life. She is satisfied with her relationship but has noticed an increase in arguments recently. She feels more sad, unmotivated, restless on the days she is scheduled to skip her dose of Cymbalta. She does not feel like herself. She has been crying a lot.   Experiencing withdrawal symptoms on the days she skips Cymbalta- Nausea and headaches   She states she has been dealing with this since high school. She is starting to feel like that again. She does not want to see a councilor or psychiatrist.   She denies any suicidal thoughts or attempts.   Review of Systems  Gastrointestinal: Positive for nausea.  Neurological: Positive for headaches.  Psychiatric/Behavioral: Negative for suicidal ideas, hallucinations, behavioral problems, confusion, self-injury and agitation. The patient is not nervous/anxious and is not hyperactive.        Objective:   Physical Exam patient is alert conversant. She denies any suicidal thoughts.        Assessment & Plan:  I think the safest thing would be to continue her on Cymbalta 20 and take it every day. She is not a candidate to take Wellbutrin because of the seizure side effect. If she continues to be unable to achieve orgasm and wants to try a different antidepressant I will refer her to psychiatry for some other drug considerations.

## 2013-08-18 NOTE — Telephone Encounter (Signed)
Called again. She did come in to be seen.

## 2013-10-01 ENCOUNTER — Other Ambulatory Visit: Payer: Self-pay

## 2013-11-04 ENCOUNTER — Ambulatory Visit (INDEPENDENT_AMBULATORY_CARE_PROVIDER_SITE_OTHER): Payer: 59 | Admitting: Family Medicine

## 2013-11-04 VITALS — BP 126/76 | HR 110 | Temp 98.6°F | Resp 17 | Ht 65.0 in | Wt 153.0 lb

## 2013-11-04 DIAGNOSIS — H6500 Acute serous otitis media, unspecified ear: Secondary | ICD-10-CM

## 2013-11-04 DIAGNOSIS — H9319 Tinnitus, unspecified ear: Secondary | ICD-10-CM

## 2013-11-04 DIAGNOSIS — H9311 Tinnitus, right ear: Secondary | ICD-10-CM

## 2013-11-04 LAB — BASIC METABOLIC PANEL
BUN: 7 mg/dL (ref 6–23)
Chloride: 101 mEq/L (ref 96–112)
Creat: 0.78 mg/dL (ref 0.50–1.10)
Glucose, Bld: 76 mg/dL (ref 70–99)

## 2013-11-04 MED ORDER — PSEUDOEPHEDRINE HCL ER 120 MG PO TB12: 120.0000 mg | ORAL_TABLET | Freq: Two times a day (BID) | ORAL | Status: DC

## 2013-11-04 MED ORDER — FLUTICASONE PROPIONATE 50 MCG/ACT NA SUSP: 2.0000 | Freq: Every day | NASAL | Status: DC

## 2013-11-04 MED ORDER — DIAZEPAM 5 MG PO TABS: 5.0000 mg | ORAL_TABLET | Freq: Every evening | ORAL | Status: DC | PRN

## 2013-11-04 NOTE — Patient Instructions (Addendum)
Tinnitus Sounds you hear in your ears and coming from within the ear is called tinnitus. This can be a symptom of many ear disorders. It is often associated with hearing loss.  Tinnitus can be seen with:  Infections.  Ear blockages such as wax buildup.  Meniere's disease.  Ear damage.  Inherited.  Occupational causes. While irritating, it is not usually a threat to health. When the cause of the tinnitus is wax, infection in the middle ear, or foreign body it is easily treated. Hearing loss will usually be reversible.  TREATMENT  When treating the underlying cause does not get rid of tinnitus, it may be necessary to get rid of the unwanted sound by covering it up with more pleasant background noises. This may include music, the radio etc. There are tinnitus maskers which can be worn which produce background noise to cover up the tinnitus. Avoid all medications which tend to make tinnitus worse such as alcohol, caffeine, aspirin, and nicotine. There are many soothing background tapes such as rain, ocean, thunderstorms, etc. These soothing sounds help with sleeping or resting. Keep all follow-up appointments and referrals. This is important to identify the cause of the problem. It also helps avoid complications, impaired hearing, disability, or chronic pain. Document Released: 11/12/2005 Document Revised: 02/04/2012 Document Reviewed: 06/30/2008 [facility] Patient Information 2014 [place], [place]. Serous Otitis Media  Serous otitis media is fluid in the middle ear space. This space contains the bones for hearing and air. Air in the middle ear space helps to transmit sound.  The air gets there through the eustachian tube. This tube goes from the back of the nose (nasopharynx) to the middle ear space. It keeps the pressure in the middle ear the same as the outside world. It also helps to drain fluid from the middle ear space. CAUSES  Serous otitis media occurs when the eustachian tube gets  blocked. Blockage can come from:  Ear infections.  Colds and other upper respiratory infections.  Allergies.  Irritants such as cigarette smoke.  Sudden changes in air pressure (such as descending in an airplane).  Enlarged adenoids.  A mass in the nasopharynx. During colds and upper respiratory infections, the middle ear space can become temporarily filled with fluid. This can happen after an ear infection also. Once the infection clears, the fluid will generally drain out of the ear through the eustachian tube. If it does not, then serous otitis media occurs. SIGNS AND SYMPTOMS   Hearing loss.  A feeling of fullness in the ear, without pain.  Young children may not show any symptoms but may show slight behavioral changes, such as agitation, ear pulling, or crying. DIAGNOSIS  Serous otitis media is diagnosed by an ear exam. Tests may be done to check on the movement of the eardrum. Hearing exams may also be done. TREATMENT  The fluid most often goes away without treatment. If allergy is the cause, allergy treatment may be helpful. Fluid that persists for several months may require minor surgery. A small tube is placed in the eardrum to:  Drain the fluid.  Restore the air in the middle ear space. In certain situations, antibiotics are used to avoid surgery. Surgery may be done to remove enlarged adenoids (if this is the cause). HOME CARE INSTRUCTIONS   Keep children away from tobacco smoke.  Be sure to keep any follow-up appointments. SEEK MEDICAL CARE IF:   Your hearing is not better in 3 months.  Your hearing is worse.  You have  ear pain.  You have drainage from the ear.  You have dizziness.  You have serous otitis media only in one ear or have any bleeding from your nose (epistaxis).  You notice a lump on your neck. MAKE SURE YOU:  Understand these instructions.   Will watch your condition.   Will get help right away if you are not doing well or get  worse.  Document Released: 02/02/2004 Document Revised: 07/15/2013 Document Reviewed: 06/09/2013 [facility] Patient Information 2014 [place], [place].

## 2013-11-04 NOTE — Progress Notes (Signed)
Subjective:    Patient ID: Patricia Morse, female    DOB: [birth year], 27 y.o.   MRN: [id number] Chief Complaint  Patient presents with  . Tinnitus  . Dizziness   This chart was scribed for Norberto Sorenson, MD by Clydene Laming, ED Scribe. This patient was seen in room 5 and the patient's care was started at . HPI   HPI Comments: Patricia Morse is a 27 y.o. female who presents to the [facility] complaining of right ear tinnitus onset four days ago with associated with mild imbalance - slightly increased from baseline. 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 Dorma Russell.  Pt states her right ear suddenly began a constant ringing when she woke up on Saturday. Pt denies any problems w/ her left ear. Has had problems w/ tinnitus in her left ear prior but never in her right. Pt did recently restart taking qhs trazodone for sleep which she has taken before but began this several days before the tinnitus started. Pt states her sleep is being interrupted by the tinnitus, that it is very difficult to fall asleep, and that it is waking her up frequently. She does feel that she is more "off-balance." No pain associated. Pt denies any cold or URI symptoms such as congestion. Pt does have some seasonal allergies and takes zyrtec regularly. Pt states she is stressed during final exams and is also going through a breakup. She is also on a low-carb diet to lose weight and has had keystones in her urine.  She has not had any other med or supplement changes other than the trazodone.  Tried one of her mother's xanax which did help her fall asleep but the tinnitus still woke her up about 3-4 a.m. Pt has called Dr. Dorma Russell' office twice and left messages about her sxs but has not yet heard back.  Patient Active Problem List   Diagnosis Date Noted  . Obesity, unspecified 06/08/2013  . Depression 10/10/2012  . Hearing loss 10/10/2012    Past Medical History  Diagnosis Date  . Bilateral sensorineural hearing loss     SECONDARY AUDITORY NEUROPATHY  . Auditory neuropathy BILATERAL  . 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 Internal derangement of right knee   . PONV (postoperative nausea and vomiting) POSTOP COCHLEAR  IMPLANT 2006  . Seasonal allergies   . Depression    Past Surgical History  Procedure Laterality Date  . Cochlear implant  11-15-2005  (AGE 42)    LEFT EAR  (CLARION HIGH-RESOLUTION 90K MULTICHANNEL,  1-J ELECTRODE)  . 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 Wisdom tooth extraction  AGE 31    DENTAL OFFICE  . Knee arthroscopy  03/13/2012    Procedure: ARTHROSCOPY KNEE;  Surgeon: Shelda Pal, MD;  Location: [facility];  Service: Orthopedics;  Laterality: Right;  RIGHT KNEE DIAGNOSTIC AND OPERATIVE SCOPE  . Chondroplasty  03/13/2012    Procedure: CHONDROPLASTY;  Surgeon: Shelda Pal, MD;  Location: [facility];  Service: Orthopedics;  Laterality: Right;   Allergies  Allergen Reactions  . Amoxicillin Hives  . Tdap [Diphth-Acell Pertussis-Tetanus]   . Hydrocodeine [Dihydrocodeine] Rash  .  Penicillins Rash   Prior to Admission medications   Medication Sig Start Date End Date Taking? Authorizing Provider  benzoyl peroxide 10 % gel Apply topically daily.   Yes Historical Provider, MD  BIOTIN FORTE PO Take 1 tablet by mouth daily.   Yes Historical Provider, MD  cetirizine (ZYRTEC) 10 MG tablet Take 10 mg by mouth daily.   Yes Historical Provider, MD  DULoxetine (CYMBALTA) 20 MG capsule Take 1 capsule (20 mg total) by mouth daily. 08/14/13  Yes Collene Gobble, MD  Multiple Vitamin (MULTIVITAMIN) tablet Take 1 tablet by mouth daily.   Yes Historical Provider, MD  norethindrone-ethinyl estradiol (JUNEL  FE,GILDESS FE,LOESTRIN FE) 1-20 MG-MCG tablet Take 1 tablet by mouth daily.   Yes Historical Provider, MD  ondansetron (ZOFRAN) 4 MG tablet Take 1 tablet (4 mg total) by mouth every 8 (eight) hours as needed for nausea. 07/23/13  Yes Morrell Riddle, PA-C  Probiotic Product (PROBIOTIC FORMULA PO) Take by mouth daily.   Yes Historical Provider, MD  Pyridoxine HCl (VITAMIN B-6 PO) Take by mouth.   Yes Historical Provider, MD  tazarotene (TAZORAC) 0.05 % cream Apply topically at bedtime.   Yes Historical Provider, MD   History   Social History  . Marital Status: Single    Spouse Name: N/A    Number of Children: N/A  . Years of Education: N/A   Occupational History  . Claims Professional for [employer]    Social History Main Topics  . Smoking status: Former Smoker    Types: Cigarettes    Quit date: 03/10/2008  . Smokeless tobacco: Never Used     Comment: SOCIAL SMOKER IN COLLEGE FOR 102YRS -- NO SMOKING SINCE  . Alcohol Use: Yes     Comment: OCCASIONAL   . Drug Use: No  . Sexual Activity: Yes    Birth Control/ Protection: Pill   Other Topics Concern  . Not on file   Social History Narrative  . No narrative on file     Review of Systems  Constitutional: Negative for fever and chills.  HENT: Positive for hearing loss and tinnitus. Negative for congestion, ear discharge, ear pain, postnasal drip, rhinorrhea, sinus pressure, sneezing, sore throat, trouble swallowing and voice change.   Respiratory: Negative for cough.   Genitourinary: Negative for dysuria, urgency, decreased urine volume and difficulty urinating.  Neurological: Positive for light-headedness. Negative for dizziness, syncope, weakness and headaches.  Psychiatric/Behavioral: Positive for sleep disturbance and dysphoric mood.      BP 126/76  Pulse 110  Temp(Src) 98.6 F (37 C) (Oral)  Resp 17  Ht 5\' 5"  (1.651 m)  Wt 153 lb (69.4 kg)  BMI 25.46 kg/m2  SpO2 98% Objective:   Physical Exam  Nursing note and vitals  reviewed. Constitutional: She is oriented to person, place, and time. She appears well-developed and well-nourished. No distress.  HENT:  Head: Normocephalic and atraumatic.  Right Ear: External ear and ear canal normal. Tympanic membrane is retracted. A middle ear effusion is present.  Left Ear: External ear and ear canal normal. Tympanic membrane is erythematous. Decreased hearing is noted.  Nose: Mucosal edema present. No rhinorrhea.  Mouth/Throat: Uvula is midline, oropharynx is clear and moist and mucous membranes are normal. No oropharyngeal exudate.  Tonsils 2+  Eyes: Conjunctivae and EOM are normal. Pupils are equal, round, and reactive to light. Right eye exhibits no discharge. Left eye exhibits no discharge. No scleral icterus.  Neck: Normal range of motion. Neck supple.  Cardiovascular:  Normal rate, regular rhythm, normal heart sounds and intact distal pulses.   Pulmonary/Chest: Effort normal and breath sounds normal.  Musculoskeletal: Normal range of motion.  Lymphadenopathy:    She has no cervical adenopathy.  Neurological: She is alert and oriented to person, place, and time. She has normal reflexes. She displays a negative Romberg sign. Coordination and gait normal.  Mild ataxia on heel-to-toe gait Neg pronator drift  Skin: Skin is warm and dry. She is not diaphoretic. No erythema.  Psychiatric: She has a normal mood and affect. Her behavior is normal.      Assessment & Plan:  Tinnitus of right ear - Plan: Basic metabolic panel, TSH - pt will cont to contact Dr. Dorma Russell' office for f/u, esp if sxs cont - may be due to some ETD - certainly has an effusion - so try sudafed and nasal steroid.  Check labs.  Will try some temporary valium to help sleep at night.    Acute serous otitis media, right  Meds ordered this encounter  Medications  . fluticasone (FLONASE) 50 MCG/ACT nasal spray    Sig: Place 2 sprays into both nostrils at bedtime.    Dispense:  16 g    Refill:  2  .  pseudoephedrine (SUDAFED 12 HOUR) 120 MG 12 hr tablet    Sig: Take 1 tablet (120 mg total) by mouth 2 (two) times daily.    Dispense:  30 tablet    Refill:  1  . diazepam (VALIUM) 5 MG tablet    Sig: Take 1 tablet (5 mg total) by mouth at bedtime as needed and may repeat dose one time if needed for sedation.    Dispense:  30 tablet    Refill:  0    I personally performed the services described in this documentation, which was scribed in my presence. The recorded information has been reviewed and considered, and addended by me as needed.  Norberto Sorenson, MD MPH

## 2014-01-20 ENCOUNTER — Ambulatory Visit (INDEPENDENT_AMBULATORY_CARE_PROVIDER_SITE_OTHER): Payer: 59

## 2014-01-20 ENCOUNTER — Telehealth: Payer: Self-pay

## 2014-01-20 NOTE — Telephone Encounter (Signed)
Patient left not seen today and wanted to be seen to get a referral to an audiologist Dr. Jac CanavanKrauss. She says Dr. Cleta Albertsaub is familiar with her on going issues. Can someone put in order/ referral for this patient? Thank you!  Best: [phone]

## 2014-01-21 ENCOUNTER — Other Ambulatory Visit: Payer: Self-pay | Admitting: Emergency Medicine

## 2014-01-21 DIAGNOSIS — H9190 Unspecified hearing loss, unspecified ear: Secondary | ICD-10-CM

## 2014-01-21 NOTE — Telephone Encounter (Signed)
Call patient and tell her I put referral in for patient to see Dr.Kraus.

## 2014-01-24 NOTE — Telephone Encounter (Signed)
lmom that we put in referral

## 2014-01-27 ENCOUNTER — Ambulatory Visit (INDEPENDENT_AMBULATORY_CARE_PROVIDER_SITE_OTHER): Payer: 59 | Admitting: Emergency Medicine

## 2014-01-27 VITALS — BP 122/78 | HR 117 | Temp 98.4°F | Resp 17 | Ht 66.0 in | Wt 138.0 lb

## 2014-01-27 DIAGNOSIS — F32A Depression, unspecified: Secondary | ICD-10-CM

## 2014-01-27 DIAGNOSIS — F329 Major depressive disorder, single episode, unspecified: Secondary | ICD-10-CM

## 2014-01-27 DIAGNOSIS — F3289 Other specified depressive episodes: Secondary | ICD-10-CM

## 2014-01-27 MED ORDER — DULOXETINE HCL 30 MG PO CPEP
30.0000 mg | ORAL_CAPSULE | Freq: Every day | ORAL | Status: DC
Start: 2014-01-27 — End: 2014-08-14

## 2014-01-27 NOTE — Progress Notes (Signed)
   Subjective:    Patient ID: Patricia Morse, female    DOB: 06-28-1986, 28 y.o.   MRN: [id number]  HPI Pt presents today with medication refill. She took cymbalta 20mg  and had cymbalta 30mg  left over. She states that 30mg  works much better for her. She said insurance is charging too much for cymbalta and wants to know if she can apply for the Tier 3 drugs. She has tried lexapro, zoloft, efexxor, and colexa. She cannot take welbutrin. Pt likes cymbalta the best. She still takes BCP's     Review of Systems     Objective:   Physical Exam patient is alert and cooperative. She has difficulty with hearing and has a cochlear implant on the left. Chest was clear heart regular rate no murmurs she does have a pigmented lesion right breast and another pigmented lesion left flank.        Assessment & Plan:  Patient requesting a letter on her behalf to be on Cymbalta 30 mg. She has failed treatment with Lexapro Zoloft Effexor and Celexa.   I advised her to see her dermatologist for skin check. Because. of her previous history of meningitis she is not a candidate to take Wellbutrin.. Ahead and order Cymbalta 30. Referral made to Dr. Edd FabianPlosky for evaluation of her depression. Will ask Patricia MccreedyBarbara to write a letter in her behalf to see if we can get Cymbalta covered .

## 2014-01-29 ENCOUNTER — Telehealth: Payer: Self-pay

## 2014-01-29 NOTE — Telephone Encounter (Signed)
Dr Cleta Albertsaub asked that I do a Tier exception to lower cost of cymbalta 30 mg for pt. She has prev tried/failed Lexapro, zoloft, effexor and celexa. Pt is not a candidate for wellbutrin d/t h/o meningitis. Called [facility] and completed tier exception questions over phone. Decision gone for review, 7-15 business days. Will receive a fax w/decision.

## 2014-02-04 NOTE — Telephone Encounter (Signed)
Received denial of tier reduction for cymbalta d/t Tier reduction override is not available per pt's plan benefit. D/W pt and advised to go ahead and see the psychiatrist Dr Cleta Albertsaub referred her to in order to see if there is any other medications that can be tried. Pt agreed. Dr Cleta Albertsaub, Lorain ChildesFYI.

## 2014-02-17 ENCOUNTER — Encounter: Payer: Self-pay | Admitting: Emergency Medicine

## 2014-02-17 ENCOUNTER — Other Ambulatory Visit: Payer: Self-pay | Admitting: Emergency Medicine

## 2014-02-17 MED ORDER — VALACYCLOVIR HCL 1 G PO TABS: ORAL_TABLET | ORAL | Status: DC

## 2014-03-31 ENCOUNTER — Encounter: Payer: Self-pay | Admitting: Emergency Medicine

## 2014-04-26 ENCOUNTER — Other Ambulatory Visit: Payer: Self-pay | Admitting: Emergency Medicine

## 2014-05-04 ENCOUNTER — Ambulatory Visit (INDEPENDENT_AMBULATORY_CARE_PROVIDER_SITE_OTHER): Payer: 59 | Admitting: Family Medicine

## 2014-05-04 VITALS — BP 125/70 | HR 108 | Temp 98.2°F | Resp 18 | Ht 65.0 in | Wt 148.4 lb

## 2014-05-04 DIAGNOSIS — Z0000 Encounter for general adult medical examination without abnormal findings: Secondary | ICD-10-CM

## 2014-05-04 NOTE — Progress Notes (Signed)
° °  Subjective:  This chart was scribed for Elvina Sidle, MD  by Ashley Jacobs, [facility] Scribe. The patient was seen in room and the patient's care was started at 11:38 AM.  Patient ID: Patricia Morse, female    DOB: [birth year], 28 y.o.   MRN: [id number]   Chief Complaint  Patient presents with   Annual Exam    SCHOOL PHYSICAL   HPI HPI Comments: Patricia Morse is a 28 y.o. female who arrives to the [facility] for a school physical. Pt complains of sore throat and post nasal drips that presented this morning after waking up. She has a hx of allergies. Denies any other complications. Pt had a left knee arthroscopy. Her mother has a hx of arthritis of her knees. She has a TB test.    She has a left cochlear implant in 2006 after gradually losing her hearing as a complication of encephalitis as a child.   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 Her mother is a Engineer, civil (consulting) and works at the [employer]	[place].    Review of Systems  HENT: Positive for sore throat.            Physical Exam  Nursing note and vitals reviewed. Constitutional: She is oriented to person, place, and time. She appears well-developed and well-nourished.  HENT:  Head: Normocephalic.  Eyes: Pupils are equal, round, and reactive to light.  Neck: Normal range of motion.  Cardiovascular: Normal rate, regular rhythm and normal heart sounds.  Exam reveals no gallop and no friction rub.   No murmur heard. Pulmonary/Chest: Effort normal. No respiratory distress. She has no wheezes. She has no rales.  Abdominal: There is no tenderness. There is no guarding.  Musculoskeletal: Normal range of motion.  Neurological: She is alert and oriented to person, place, and time.  Skin: Skin is warm and dry. She is not diaphoretic.  Psychiatric: She has a normal mood and affect. Her behavior is normal.   Filed Vitals:   05/04/14 1049  BP: 125/70  Pulse: 108  Temp: 98.2 F  (36.8 C)  Resp: 18   COORDINATION OF CARE:  11:42 AM Discussed course of care with pt. Normal physical exam reported.  Pt understands and agrees.   Normal physical with exception of the decreased hearing bilaterally and cochlear implant on the left.  Speech is excellent  Forms completed for nursing school      Assessment & Plan:   Annual physical exam  Signed, Elvina Sidle, MD

## 2014-08-02 ENCOUNTER — Ambulatory Visit (INDEPENDENT_AMBULATORY_CARE_PROVIDER_SITE_OTHER): Payer: 59 | Admitting: Emergency Medicine

## 2014-08-02 VITALS — BP 120/80 | HR 97 | Temp 98.6°F | Resp 16 | Ht 65.0 in | Wt 149.0 lb

## 2014-08-02 DIAGNOSIS — J018 Other acute sinusitis: Secondary | ICD-10-CM

## 2014-08-02 MED ORDER — PSEUDOEPHEDRINE-GUAIFENESIN ER 60-600 MG PO TB12: 1.0000 | ORAL_TABLET | Freq: Two times a day (BID) | ORAL | Status: DC

## 2014-08-02 MED ORDER — LEVOFLOXACIN 500 MG PO TABS: 500.0000 mg | ORAL_TABLET | Freq: Every day | ORAL | Status: AC

## 2014-08-02 NOTE — Patient Instructions (Signed)

## 2014-08-02 NOTE — Progress Notes (Signed)
[facility] [address] [place] [address] [phone]  Date:  08/02/2014   Name:  Patricia Morse   DOB:  17-Aug-1986   MRN:  [id number]  PCP:  Lucilla Edin, MD    Chief Complaint: Cough, Nasal Congestion and Jaw Pain   History of Present Illness:  Patricia Morse is a 28 y.o. very pleasant female patient who presents with the following:  Ill for nearly two weeks with nasal congestion and mucopurulent drainage and post nasal drip.  Has pain in right ear and jaw.  Pain in maxillary region. No fever or chills. Has mostly non productive cough.  No wheezing or shortness of breath. Has increased difficulty hearing.   No improvement with over the counter medications or other home remedies.  Denies other complaint or health concern today.   Patient Active Problem List   Diagnosis Date Noted  . Obesity, unspecified 06/08/2013  . Depression 10/10/2012  . Hearing loss 10/10/2012    Past Medical History  Diagnosis Date  . Bilateral sensorineural hearing loss     SECONDARY AUDITORY NEUROPATHY  . Auditory neuropathy BILATERAL  . 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 Internal derangement of right knee   . PONV (postoperative nausea and vomiting) POSTOP COCHLEAR  IMPLANT 2006  . Seasonal allergies   . Depression   . Allergy     Past Surgical History  Procedure Laterality Date  . Cochlear implant  11-15-2005  (AGE 53)    LEFT EAR  (CLARION HIGH-RESOLUTION 90K MULTICHANNEL,  1-J ELECTRODE)  . 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 Wisdom tooth extraction  AGE 44    DENTAL OFFICE  . Knee arthroscopy  03/13/2012    Procedure: ARTHROSCOPY KNEE;  Surgeon: Shelda Pal, MD;  Location: [facility];  Service: Orthopedics;  Laterality: Right;  RIGHT KNEE DIAGNOSTIC AND  OPERATIVE SCOPE  . Chondroplasty  03/13/2012    Procedure: CHONDROPLASTY;  Surgeon: Shelda Pal, MD;  Location: [facility];  Service: Orthopedics;  Laterality: Right;    History  Substance Use Topics  . Smoking status: Former Smoker    Types: Cigarettes    Quit date: 03/10/2008  . Smokeless tobacco: Never Used     Comment: SOCIAL SMOKER IN COLLEGE FOR 86YRS -- NO SMOKING SINCE  . Alcohol Use: Yes     Comment: OCCASIONAL     Family History  Problem Relation Age of Onset  . Depression Mother   . Hypertension Mother   . Mental illness Mother     Allergies  Allergen Reactions  . Amoxicillin Hives  . Tdap [Diphth-Acell Pertussis-Tetanus]   . Hydrocodeine [Dihydrocodeine] Rash  . Penicillins Rash    Medication list has been reviewed and updated.  Current Outpatient Prescriptions on File Prior to Visit  Medication Sig Dispense Refill  . desogestrel-ethinyl estradiol (APRI,EMOQUETTE,SOLIA) 0.15-30 MG-MCG tablet Take 1 tablet by mouth daily.      . DULoxetine (CYMBALTA) 30 MG capsule Take 1 capsule (30 mg total) by mouth daily.  30 capsule  11  . Multiple Vitamin (MULTIVITAMIN) tablet Take 1 tablet by mouth daily.      . Probiotic Product (PROBIOTIC FORMULA PO) Take by mouth daily.      . valACYclovir (  VALTREX) 1000 MG tablet Take 2 tablets twice a day for 2 doses for  Cold sores  30 tablet  3  . benzoyl peroxide 10 % gel Apply topically daily.      . cetirizine (ZYRTEC) 10 MG tablet Take 10 mg by mouth daily.      . norethindrone-ethinyl estradiol (JUNEL FE,GILDESS FE,LOESTRIN FE) 1-20 MG-MCG tablet Take 1 tablet by mouth daily.      . Pyridoxine HCl (VITAMIN B-6 PO) Take by mouth.      . tazarotene (TAZORAC) 0.05 % cream Apply topically at bedtime.       No current facility-administered medications on file prior to visit.    Review of Systems:  As per HPI, otherwise negative.    Physical Examination: Filed Vitals:   08/02/14 1329  BP: 120/80  Pulse:  97  Temp: 98.6 F (37 C)  Resp: 16   Filed Vitals:   08/02/14 1329  Height:  (1.651 m)  Weight: 149 lb (67.586 kg)   Body mass index is 24.79 kg/(m^2). Ideal Body Weight: Weight in (lb) to have BMI = 25: 149.9  GEN: WDWN, NAD, Non-toxic, A & O x 3 HEENT: Atraumatic, Normocephalic. Neck supple. No masses, No LAD. Ears and Nose: No external deformity.  Purulent nasal and post nasal drip CV: RRR, No M/G/R. No JVD. No thrill. No extra heart sounds. PULM: CTA B, no wheezes, crackles, rhonchi. No retractions. No resp. distress. No accessory muscle use. ABD: S, NT, ND, +BS. No rebound. No HSM. EXTR: No c/c/e NEURO Normal gait.  PSYCH: Normally interactive. Conversant. Not depressed or anxious appearing.  Calm demeanor.    Assessment and Plan: Sinusitis augmentin mucinex   Signed,  Phillips Odor, MD

## 2014-08-12 ENCOUNTER — Encounter: Payer: Self-pay | Admitting: Emergency Medicine

## 2014-08-14 ENCOUNTER — Other Ambulatory Visit: Payer: Self-pay | Admitting: Emergency Medicine

## 2014-08-14 MED ORDER — DULOXETINE HCL 20 MG PO CPEP: 40.0000 mg | ORAL_CAPSULE | Freq: Every day | ORAL | Status: DC

## 2014-09-06 ENCOUNTER — Encounter: Payer: Self-pay | Admitting: Emergency Medicine

## 2014-09-07 ENCOUNTER — Other Ambulatory Visit: Payer: Self-pay | Admitting: Emergency Medicine

## 2014-09-07 DIAGNOSIS — H9190 Unspecified hearing loss, unspecified ear: Secondary | ICD-10-CM

## 2014-09-10 ENCOUNTER — Other Ambulatory Visit: Payer: Self-pay

## 2014-09-15 ENCOUNTER — Encounter: Payer: Self-pay | Admitting: Emergency Medicine

## 2014-09-16 ENCOUNTER — Telehealth: Payer: Self-pay

## 2014-09-16 NOTE — Telephone Encounter (Signed)
Form put in Dr. Ellis Parentsaub's box.

## 2014-09-16 NOTE — Telephone Encounter (Signed)
Pt came in to drop off form for Dr Cleta Albertsaub to sign for exempting her from the flu shot. She states she needs it done as soon as possible for school. She wants to pick the form up when it is completed.  She can be reached @ [phone]. Thank you

## 2014-09-17 NOTE — Telephone Encounter (Signed)
Dr. Cleta Albertsaub signed, up front for p/u, copy sent to scan, [facility] letting her know

## 2014-11-16 ENCOUNTER — Ambulatory Visit (INDEPENDENT_AMBULATORY_CARE_PROVIDER_SITE_OTHER): Payer: 59 | Admitting: Physician Assistant

## 2014-11-16 VITALS — BP 100/68 | HR 107 | Temp 98.9°F | Resp 18 | Ht 65.5 in | Wt 151.0 lb

## 2014-11-16 DIAGNOSIS — L850 Acquired ichthyosis: Secondary | ICD-10-CM

## 2014-11-16 DIAGNOSIS — L853 Xerosis cutis: Secondary | ICD-10-CM

## 2014-11-16 MED ORDER — METRONIDAZOLE 0.75 % EX CREA: TOPICAL_CREAM | Freq: Two times a day (BID) | CUTANEOUS | Status: DC

## 2014-11-16 NOTE — Progress Notes (Signed)
   Subjective:    Patient ID: Patricia Morse, female    DOB: 04/02/1986, 28 y.o.   MRN: [id number]  PCP: Lucilla EdinAUB, STEVE A, MD  Chief Complaint  Patient presents with  . Rash    x1 month; around nose   Patient Active Problem List   Diagnosis Date Noted  . Obesity, unspecified 06/08/2013  . Depression 10/10/2012  . Hearing loss 10/10/2012   Prior to Admission medications   Medication Sig Start Date End Date Taking? Authorizing Provider  benzoyl peroxide 10 % gel Apply topically daily.   Yes Historical Provider, MD  cetirizine (ZYRTEC) 10 MG tablet Take 10 mg by mouth daily.   Yes Historical Provider, MD  desogestrel-ethinyl estradiol (APRI,EMOQUETTE,SOLIA) 0.15-30 MG-MCG tablet Take 1 tablet by mouth daily.   Yes Historical Provider, MD  DULoxetine (CYMBALTA) 20 MG capsule Take 2 capsules (40 mg total) by mouth daily. Patient taking differently: Take 30 mg by mouth daily.  08/14/14  Yes Collene GobbleSteven A Daub, MD  valACYclovir (VALTREX) 1000 MG tablet Take 2 tablets twice a day for 2 doses for  Cold sores 02/17/14  Yes Collene GobbleSteven A Daub, MD  metroNIDAZOLE (METROCREAM) 0.75 % cream Apply topically 2 (two) times daily. 11/16/14   Raelyn Ensignodd Joley Utecht, PA   Medications, allergies, past medical history, surgical history, family history, social history and problem list reviewed and updated.  HPI  [age] yof with pmh well controlled acne presents with irritation and redness around nostrils for the past month.   She was sick around Thanksgiving and noticed the irritation around then, it has not resolved. She has tried bactroban topical along with different creams and moisturizers, and dandruff shampoo, all with no relief. She thinks bactroban may have made it worse. Denies uri sx at this time.   HR mildly elevated in clinic today. Pt states she had coffee prior to appt. She runs high looking back through prior appts.   Review of Systems No fever, chills.     Objective:   Physical Exam  Constitutional: She is  oriented to person, place, and time. She appears well-developed and well-nourished.  Non-toxic appearance. She does not have a sickly appearance. She does not appear ill. No distress.  BP 100/68 mmHg  Pulse 107  Temp(Src) 98.9 F (37.2 C) (Oral)  Resp 18  Ht 5' 5.5" (1.664 m)  Wt 151 lb (68.493 kg)  BMI 24.74 kg/m2  SpO2 99%  LMP 11/05/2014   Neurological: She is alert and oriented to person, place, and time.  Skin: There is erythema.  Erythema around both nostril edges. Slight dry skin over erythema on left side. No purulence. No crusting.        irritatice skin condition, metronidaxoe 1-2 x for few wks, if not better than contac tus and derm      Assessment & Plan:   [age] yof with pmh well controlled acne presents with irritation and redness around nostrils for the past month.   Dry skin dermatitis - Plan: metroNIDAZOLE (METROCREAM) 0.75 % cream --Irritated/dry skin under nostrils --No improvement with abx topical or moisturizers --metronidazole topical bid --If no improvement in 2-3 weeks, contact office, derm referral  Donnajean Lopesodd M. Patti Shorb, PA-C Physician Assistant-Certified [facility]  11/16/2014 11:41 AM

## 2014-11-16 NOTE — Patient Instructions (Signed)
Please try to apply the metronidazole cream twice daily to the irritated area for the new few weeks.  It should slowly start to feel better.  If you're not improving in 1-2 weeks, please let us know and we will have you see a dermatologist.

## 2014-11-24 ENCOUNTER — Encounter (HOSPITAL_BASED_OUTPATIENT_CLINIC_OR_DEPARTMENT_OTHER): Admission: RE | Payer: Self-pay | Source: Ambulatory Visit

## 2014-11-24 ENCOUNTER — Ambulatory Visit (HOSPITAL_BASED_OUTPATIENT_CLINIC_OR_DEPARTMENT_OTHER): Admission: RE | Admit: 2014-11-24 | Payer: 59 | Source: Ambulatory Visit | Admitting: Otolaryngology

## 2014-11-24 SURGERY — INSERTION, IMPLANT, COCHLEAR
Anesthesia: General | Site: Ear | Laterality: Right

## 2014-11-25 ENCOUNTER — Encounter: Payer: Self-pay | Admitting: Emergency Medicine

## 2014-11-26 ENCOUNTER — Other Ambulatory Visit: Payer: Self-pay | Admitting: Emergency Medicine

## 2014-11-26 DIAGNOSIS — R21 Rash and other nonspecific skin eruption: Secondary | ICD-10-CM

## 2015-01-18 ENCOUNTER — Ambulatory Visit (INDEPENDENT_AMBULATORY_CARE_PROVIDER_SITE_OTHER): Payer: 59 | Admitting: Physician Assistant

## 2015-01-18 VITALS — BP 122/72 | HR 113 | Temp 99.1°F | Resp 18 | Ht 65.0 in | Wt 153.0 lb

## 2015-01-18 DIAGNOSIS — M25512 Pain in left shoulder: Secondary | ICD-10-CM

## 2015-01-18 MED ORDER — MELOXICAM 15 MG PO TABS: 15.0000 mg | ORAL_TABLET | Freq: Every day | ORAL | Status: DC

## 2015-01-18 MED ORDER — CYCLOBENZAPRINE HCL 10 MG PO TABS: 10.0000 mg | ORAL_TABLET | Freq: Three times a day (TID) | ORAL | Status: DC | PRN

## 2015-01-18 NOTE — Patient Instructions (Signed)
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
## 2015-01-24 ENCOUNTER — Encounter: Payer: Self-pay | Admitting: Emergency Medicine

## 2015-01-29 ENCOUNTER — Other Ambulatory Visit: Payer: Self-pay | Admitting: Emergency Medicine

## 2015-01-30 ENCOUNTER — Telehealth: Payer: Self-pay | Admitting: Physician Assistant

## 2015-01-30 NOTE — Telephone Encounter (Signed)
Due to interaction of duloxetine and flexeril, called to confirm that patient was no longer taking flexeril and she states that she discontinued use after day 2 and she feels much better now.

## 2015-02-08 ENCOUNTER — Encounter: Payer: Self-pay | Admitting: Emergency Medicine

## 2015-02-11 ENCOUNTER — Ambulatory Visit (INDEPENDENT_AMBULATORY_CARE_PROVIDER_SITE_OTHER): Payer: 59

## 2015-02-11 ENCOUNTER — Ambulatory Visit (INDEPENDENT_AMBULATORY_CARE_PROVIDER_SITE_OTHER): Payer: 59 | Admitting: Emergency Medicine

## 2015-02-11 VITALS — BP 110/72 | HR 86 | Temp 97.9°F | Resp 16 | Ht 65.0 in | Wt 156.4 lb

## 2015-02-11 DIAGNOSIS — Z3200 Encounter for pregnancy test, result unknown: Secondary | ICD-10-CM | POA: Diagnosis not present

## 2015-02-11 DIAGNOSIS — M25562 Pain in left knee: Secondary | ICD-10-CM

## 2015-02-11 LAB — POCT URINE PREGNANCY: PREG TEST UR: NEGATIVE

## 2015-02-11 NOTE — Progress Notes (Signed)
   Subjective:    Patient ID: Patricia Morse, female    DOB: August 01, 1986, 29 y.o.   MRN: [id number] This chart was scribed for Lesle ChrisSteven Tamora Huneke, MD by Jolene Provostobert Halas, Medical Scribe. This patient was seen in Room 4 and the patient's care was started at 5:37 PM.  Chief Complaint  Patient presents with  . Knee Pain    left knee x 2 weeks    HPI HPI Comments: Patricia Morse is a 29 y.o. female with a past hx of arthroscopy on the right knee [facility] of left knee pain for the last two weeks, that is worse with running or climbing stairs. Pt states her knee sometimes pops. Pt states sometimes when she is standing her knee occasionally gives out.    Review of Systems  Constitutional: Negative for fever and chills.  Musculoskeletal: Positive for arthralgias. Negative for joint swelling and gait problem.  Neurological: Negative for weakness and numbness.       Objective:   Physical Exam  Constitutional: She is oriented to person, place, and time. She appears well-developed and well-nourished. No distress.  HENT:  Head: Normocephalic and atraumatic.  Eyes: Conjunctivae are normal. Pupils are equal, round, and reactive to light.  Neck: Normal range of motion. Neck supple.  Cardiovascular: Normal rate.   Pulmonary/Chest: Effort normal. No respiratory distress.  Musculoskeletal: Normal range of motion.  Tender over medial joint space on left. No laxity or instability in the joint.  Neurological: She is alert and oriented to person, place, and time. Coordination normal.  Skin: Skin is warm and dry. She is not diaphoretic.  Psychiatric: She has a normal mood and affect. Her behavior is normal.  Nursing note and vitals reviewed. [facility] reading (PRIMARY) by  Dr. Cleta Albertsaub x-rays appear normal      Assessment & Plan:  Patient has a symptomatic plica of her knee. She will be on nonweightbearing exercises. Referral made to Dr. Charlann Boxerlin for his evaluation.I personally performed the  services described in this documentation, which was scribed in my presence. The recorded information has been reviewed and is accurate.I personally performed the services described in this documentation, which was scribed in my presence. The recorded information has been reviewed and is accurate.

## 2015-02-11 NOTE — Patient Instructions (Signed)

## 2015-03-02 ENCOUNTER — Other Ambulatory Visit: Payer: Self-pay | Admitting: Physician Assistant

## 2015-03-15 ENCOUNTER — Encounter: Payer: Self-pay | Admitting: Emergency Medicine

## 2015-03-21 ENCOUNTER — Encounter: Payer: Self-pay | Admitting: Emergency Medicine

## 2015-03-21 ENCOUNTER — Ambulatory Visit (INDEPENDENT_AMBULATORY_CARE_PROVIDER_SITE_OTHER): Payer: 59

## 2015-03-21 ENCOUNTER — Other Ambulatory Visit: Payer: Self-pay | Admitting: Emergency Medicine

## 2015-03-22 ENCOUNTER — Other Ambulatory Visit: Payer: Self-pay | Admitting: Emergency Medicine

## 2015-03-22 MED ORDER — DULOXETINE HCL 30 MG PO CPEP: ORAL_CAPSULE | ORAL | Status: DC

## 2015-04-07 ENCOUNTER — Other Ambulatory Visit: Payer: Self-pay | Admitting: Emergency Medicine

## 2015-07-11 ENCOUNTER — Ambulatory Visit (INDEPENDENT_AMBULATORY_CARE_PROVIDER_SITE_OTHER): Payer: 59 | Admitting: Emergency Medicine

## 2015-07-11 VITALS — BP 104/70 | HR 81 | Temp 98.4°F | Resp 18 | Ht 65.0 in | Wt 158.0 lb

## 2015-07-11 DIAGNOSIS — F32A Depression, unspecified: Secondary | ICD-10-CM

## 2015-07-11 DIAGNOSIS — F329 Major depressive disorder, single episode, unspecified: Secondary | ICD-10-CM | POA: Diagnosis not present

## 2015-07-11 MED ORDER — DULOXETINE HCL 30 MG PO CPEP: ORAL_CAPSULE | ORAL | Status: DC

## 2015-07-11 NOTE — Patient Instructions (Signed)

## 2015-07-11 NOTE — Progress Notes (Signed)
Patient ID: Patricia Morse, female   DOB: [birth year], 29 y.o.   MRN: [id number]    This chart was scribed for Earl Lites, MD by [facility], medical scribe at [facility].The patient was seen in exam room 08 and the patient's care was started at 12:22 PM.  Chief Complaint:  Chief Complaint  Patient presents with  . Follow-up    Antidepressant   HPI: Patricia Morse is a 29 y.o. female who reports to [facility] today for a follow up for antidepressants. She is currently taking Cymbalta and feels most of her depression is situational. Life is a shaky at the moment because she is currently unemployed, and has been looking for a job for the past three months. She has a degree in psychology. Living in a one bedroom apartment that her father is currently paying for. Recently started exercising at her apartment gym. Not currently in a relationship.   Past Medical History  Diagnosis Date  . Bilateral sensorineural hearing loss     SECONDARY AUDITORY NEUROPATHY  . Auditory neuropathy BILATERAL  . 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 Internal derangement of right knee   . PONV (postoperative nausea and vomiting) POSTOP COCHLEAR  IMPLANT 2006  . Seasonal allergies   . Depression   . Allergy    Past Surgical History  Procedure Laterality Date  . Cochlear implant  11-15-2005  (AGE 49)    LEFT EAR  (CLARION HIGH-RESOLUTION 90K MULTICHANNEL,  1-J ELECTRODE)  . 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 Wisdom tooth extraction  AGE 15    DENTAL OFFICE  . Knee arthroscopy  03/13/2012    Procedure: ARTHROSCOPY KNEE;  Surgeon: Shelda Pal, MD;  Location[facility];  Service: Orthopedics;  Laterality: Right;  RIGHT KNEE DIAGNOSTIC AND OPERATIVE SCOPE  . Chondroplasty  03/13/2012    Procedure:  CHONDROPLASTY;  Surgeon: Shelda Pal, MD;  Location: [facility];  Service: Orthopedics;  Laterality: Right;   Social History   Social History  . Marital Status: Single    Spouse Name: N/A  . Number of Children: N/A  . Years of Education: N/A   Occupational History  . Claims Professional for [employer]    Social History Main Topics  . Smoking status: Former Smoker    Types: Cigarettes    Quit date: 03/10/2008  . Smokeless tobacco: Never Used     Comment: SOCIAL SMOKER IN COLLEGE FOR 55YRS -- NO SMOKING SINCE  . Alcohol Use: Yes     Comment: OCCASIONAL   . Drug Use: No  . Sexual Activity: Yes    Birth Control/ Protection: Pill   Other Topics Concern  . None   Social History Narrative   Family History  Problem Relation Age of Onset  . Depression Mother   . Hypertension Mother   . Mental illness Mother    Allergies  Allergen Reactions  . Amoxicillin Hives  . Tdap [Diphth-Acell Pertussis-Tetanus]   . Hydrocodeine [Dihydrocodeine] Rash  . Penicillins Rash   Prior to Admission medications   Medication Sig Start Date End Date Taking? Authorizing Provider  benzoyl peroxide 10 % gel Apply topically daily.   Yes Historical Provider, MD  cetirizine (ZYRTEC) 10 MG tablet Take 10  mg by mouth daily.   Yes Historical Provider, MD  desogestrel-ethinyl estradiol (APRI,EMOQUETTE,SOLIA) 0.15-30 MG-MCG tablet Take 1 tablet by mouth daily.   Yes Historical Provider, MD  DULoxetine (CYMBALTA) 30 MG capsule Take 1 tablet daily 03/22/15  Yes Collene Gobble, MD  valACYclovir (VALTREX) 1000 MG tablet TAKE 2 TABLETS TWICE A DAY FOR 2 DOSES FOR COLD SORES 04/09/15  Yes Collene Gobble, MD  cyclobenzaprine (FLEXERIL) 10 MG tablet Take 1 tablet (10 mg total) by mouth 3 (three) times daily as needed for muscle spasms. Patient not taking: Reported on 02/11/2015 01/18/15   Tishira R Brewington, PA-C  meloxicam (MOBIC) 15 MG tablet Take 1 tablet (15 mg total) by mouth daily. Patient not  taking: Reported on 02/11/2015 01/18/15   Tishira R Brewington, PA-C  metroNIDAZOLE (METROCREAM) 0.75 % cream Apply topically 2 (two) times daily. Patient not taking: Reported on 01/18/2015 11/16/14   Raelyn Ensign, PA   ROS: The patient denies fevers, chills, night sweats, unintentional weight loss, chest pain, palpitations, wheezing, dyspnea on exertion, nausea, vomiting, abdominal pain, dysuria, hematuria, melena, numbness, weakness, or tingling.   All other systems have been reviewed and were otherwise negative with the exception of those mentioned in the HPI and as above.    PHYSICAL EXAM: Filed Vitals:   07/11/15 1043  BP: 104/70  Pulse: 81  Temp: 98.4 F (36.9 C)  Resp: 18   Body mass index is 26.29 kg/(m^2).  General: Alert, no acute distress HEENT:  Normocephalic, atraumatic, oropharynx patent. Cochlear implant on the left. Difficulty hearing Eye: [facility], [facility] Cardiovascular:  Regular rate and rhythm, no rubs murmurs or gallops.  No Carotid bruits, radial pulse intact. No pedal edema.  Respiratory: Clear to auscultation bilaterally.  No wheezes, rales, or rhonchi.  No cyanosis, no use of accessory musculature Abdominal: No organomegaly, abdomen is soft and non-tender, positive bowel sounds.  No masses. Musculoskeletal: Gait intact. No edema, tenderness Skin: No rashes. Neurologic: Facial musculature symmetric. Psychiatric: Patient acts appropriately throughout our interaction. Lymphatic: No cervical or submandibular lymphadenopathy  LABS: Results for orders placed or performed in visit on 02/11/15  POCT urine pregnancy  Result Value Ref Range   Preg Test, Ur Negative    EKG/XRAY:   Primary read interpreted by Dr. Cleta Alberts at [facility].  ASSESSMENT/PLAN: Patient agrees her primary issue with depression is situational. She feels that if she were able to get a job things would be better and I agree with her. I do feel like counseling would be helpful. She has seen Mrs. Gwenlyn Fudge in  the past for counseling. I encouraged her to consider this again. Limiting factor is money again. We will continue the Cymbalta for now.  Gross sideeffects, risk and benefits, and alternatives of medications d/w patient. Patient is aware that all medications have potential sideeffects and we are unable to predict every sideeffect or drug-drug interaction that may occur.   Lesle Chris MD 07/11/2015 12:02 PM

## 2015-08-05 ENCOUNTER — Ambulatory Visit (INDEPENDENT_AMBULATORY_CARE_PROVIDER_SITE_OTHER): Payer: 59 | Admitting: Emergency Medicine

## 2015-08-05 VITALS — BP 118/64 | HR 98 | Temp 98.6°F | Resp 18 | Ht 66.75 in | Wt 157.6 lb

## 2015-08-05 DIAGNOSIS — H18822 Corneal disorder due to contact lens, left eye: Secondary | ICD-10-CM

## 2015-08-05 NOTE — Patient Instructions (Signed)
please call your doctor so you can be seen on Monday. Use Lacri-Lube an over-the-counter preparation at night to keep your lubricated. She  Corneal Abrasion The cornea is the clear covering at the front and center of the eye. When looking at the colored portion of the eye (iris), you are looking through the cornea. This very thin tissue is made up of many layers. The surface layer is a single layer of cells (corneal epithelium) and is one of the most sensitive tissues in the body. If a scratch or injury causes the corneal epithelium to come off, it is called a corneal abrasion. If the injury extends to the tissues below the epithelium, the condition is called a corneal ulcer. CAUSES   Scratches.  Trauma.  Foreign body in the eye. Some people have recurrences of abrasions in the area of the original injury even after it has healed (recurrent erosion syndrome). Recurrent erosion syndrome generally improves and goes away with time. SYMPTOMS   Eye pain.  Difficulty or inability to keep the injured eye open.  The eye becomes very sensitive to light.  Recurrent erosions tend to happen suddenly, first thing in the morning, usually after waking up and opening the eye. DIAGNOSIS  Your health care provider can diagnose a corneal abrasion during an eye exam. Dye is usually placed in the eye using a drop or a small paper strip moistened by your tears. When the eye is examined with a special light, the abrasion shows up clearly because of the dye. TREATMENT   Small abrasions may be treated with antibiotic drops or ointment alone.  A pressure patch may be put over the eye. If this is done, follow your doctor's instructions for when to remove the patch. Do not drive or use machines while the eye patch is on. Judging distances is hard to do with a patch on. If the abrasion becomes infected and spreads to the deeper tissues of the cornea, a corneal ulcer can result. This is serious because it can cause  corneal scarring. Corneal scars interfere with light passing through the cornea and cause a loss of vision in the involved eye. HOME CARE INSTRUCTIONS  Use medicine or ointment as directed. Only take over-the-counter or prescription medicines for pain, discomfort, or fever as directed by your health care provider.  Do not drive or operate machinery if your eye is patched. Your ability to judge distances is impaired.  If your health care provider has given you a follow-up appointment, it is very important to keep that appointment. Not keeping the appointment could result in a severe eye infection or permanent loss of vision. If there is any problem keeping the appointment, let your health care provider know. SEEK MEDICAL CARE IF:   You have pain, light sensitivity, and a scratchy feeling in one eye or both eyes.  Your pressure patch keeps loosening up, and you can blink your eye under the patch after treatment.  Any kind of discharge develops from the eye after treatment or if the lids stick together in the morning.  You have the same symptoms in the morning as you did with the original abrasion days, weeks, or months after the abrasion healed. MAKE SURE YOU:   Understand these instructions.  Will watch your condition.  Will get help right away if you are not doing well or get worse. Document Released: 11/09/2000 Document Revised: 11/17/2013 Document Reviewed: 07/20/2013 [facility] Patient Information 2015 [place], [place]. This information is not intended to replace advice  given to you by your health care provider. Make sure you discuss any questions you have with your health care provider.  

## 2015-08-05 NOTE — Progress Notes (Signed)
Patient ID: Patricia Morse, female   DOB: 07/16/1986, 29 y.o.   MRN: [id number]    This chart was scribed for Collene Gobble, MD by Charline Bills, ED Scribe. The patient was seen in room 9. Patient's care was started at 12:46 PM.  Chief Complaint:  Chief Complaint  Patient presents with  . Eye Problem    Left eye infection, started yesterday around 430p. Sunlight hurts her eyes.    HPI: Patricia Morse is a 29 y.o. female who reports to [facility] today complaining of sudden onset of constant left eye pain since yesterday afternoon. Pt suspects that she may have scratched her eye when removing her contacts. She also wears prescription glasses. Pt reports that pain is exacerbated with palpation. She reports associated photophobia, redness, surrounding swelling, clear rhinorrhea. Pt denies fever or right eye pain. She has an upcoming appointment with her eye doctor on 08/15/15. She reports cloudy cornea last fall during annually eye exam that has resolved.   Past Medical History  Diagnosis Date  . Bilateral sensorineural hearing loss     SECONDARY AUDITORY NEUROPATHY  . Auditory neuropathy BILATERAL  . 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 Internal derangement of right knee   . PONV (postoperative nausea and vomiting) POSTOP COCHLEAR  IMPLANT 2006  . Seasonal allergies   . Depression   . Allergy    Past Surgical History  Procedure Laterality Date  . Cochlear implant  11-15-2005  (AGE 59)    LEFT EAR  (CLARION HIGH-RESOLUTION 90K MULTICHANNEL,  1-J ELECTRODE)  . 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 Wisdom tooth extraction  AGE 68    DENTAL OFFICE  . Knee arthroscopy  03/13/2012    Procedure: ARTHROSCOPY KNEE;  Surgeon: Shelda Pal, MD;  Location: [facility];  Service: Orthopedics;  Laterality: Right;   RIGHT KNEE DIAGNOSTIC AND OPERATIVE SCOPE  . Chondroplasty  03/13/2012    Procedure: CHONDROPLASTY;  Surgeon: Shelda Pal, MD;  Location: [facility];  Service: Orthopedics;  Laterality: Right;   Social History   Social History  . Marital Status: Single    Spouse Name: N/A  . Number of Children: N/A  . Years of Education: N/A   Occupational History  . Claims Professional for [employer]    Social History Main Topics  . Smoking status: Former Smoker    Types: Cigarettes    Quit date: 03/10/2008  . Smokeless tobacco: Never Used     Comment: SOCIAL SMOKER IN COLLEGE FOR 59YRS -- NO SMOKING SINCE  . Alcohol Use: Yes     Comment: OCCASIONAL   . Drug Use: No  . Sexual Activity: Yes    Birth Control/ Protection: Pill   Other Topics Concern  . None   Social History Narrative   Family History  Problem Relation Age of Onset  . Depression Mother   . Hypertension Mother   . Mental illness Mother    Allergies  Allergen Reactions  . Amoxicillin Hives  . Tdap [Diphth-Acell Pertussis-Tetanus]   . Hydrocodeine [Dihydrocodeine] Rash  . Penicillins Rash   Prior to Admission medications   Medication Sig Start Date End Date Taking? Authorizing Provider  cetirizine (ZYRTEC) 10 MG tablet Take 10 mg by mouth daily.   Yes  Historical Provider, MD  desogestrel-ethinyl estradiol (APRI,EMOQUETTE,SOLIA) 0.15-30 MG-MCG tablet Take 1 tablet by mouth daily.   Yes Historical Provider, MD  DULoxetine (CYMBALTA) 30 MG capsule Take 1 tablet daily 07/11/15  Yes Collene Gobble, MD  benzoyl peroxide 10 % gel Apply topically daily.    Historical Provider, MD  cyclobenzaprine (FLEXERIL) 10 MG tablet Take 1 tablet (10 mg total) by mouth 3 (three) times daily as needed for muscle spasms. Patient not taking: Reported on 02/11/2015 01/18/15   Tishira R Brewington, PA-C  meloxicam (MOBIC) 15 MG tablet Take 1 tablet (15 mg total) by mouth daily. Patient not taking: Reported on 02/11/2015 01/18/15    Tishira R Brewington, PA-C  metroNIDAZOLE (METROCREAM) 0.75 % cream Apply topically 2 (two) times daily. Patient not taking: Reported on 01/18/2015 11/16/14   Raelyn Ensign, PA  valACYclovir (VALTREX) 1000 MG tablet TAKE 2 TABLETS TWICE A DAY FOR 2 DOSES FOR COLD SORES Patient not taking: Reported on 08/05/2015 04/09/15   Collene Gobble, MD     ROS: The patient denies fevers, chills, night sweats, unintentional weight loss, chest pain, palpitations, wheezing, dyspnea on exertion, nausea, vomiting, abdominal pain, dysuria, hematuria, melena, numbness, weakness, or tingling.  + eye pain, + photophobia, + eye redness, + facial swelling (L eyelid), + rhinorrhea   All other systems have been reviewed and were otherwise negative with the exception of those mentioned in the HPI and as above.    PHYSICAL EXAM: Filed Vitals:   08/05/15 1227  BP: 118/64  Pulse: 98  Temp: 98.6 F (37 C)  Resp: 18   Body mass index is 24.88 kg/(m^2).   General: Alert, no acute distress HEENT:  Normocephalic, atraumatic, oropharynx patent. Eye: EOMI, PEERLDC, mild redness of the conjunctiva of the L eye. Disc margin sharp. Peripheral fields are normal. fluorescein staining reveals minimal uptake. No definite corneal abrasions.  Cardiovascular:  Regular rate and rhythm, no rubs murmurs or gallops.  No Carotid bruits, radial pulse intact. No pedal edema.  Respiratory: Clear to auscultation bilaterally.  No wheezes, rales, or rhonchi.  No cyanosis, no use of accessory musculature Abdominal: No organomegaly, abdomen is soft and non-tender, positive bowel sounds. No masses. Musculoskeletal: Gait intact. No edema, tenderness Skin: No rashes. Neurologic: Facial musculature symmetric. Psychiatric: Patient acts appropriately throughout our interaction. Lymphatic: No cervical or submandibular lymphadenopathy Genitourinary/Anorectal: No acute findings   LABS: Results for orders placed or performed in visit on 02/11/15    POCT urine pregnancy  Result Value Ref Range   Preg Test, Ur Negative     EKG/XRAY:   Primary read interpreted by Dr. Cleta Alberts at [facility].   ASSESSMENT/PLAN: I suspect she has a low-grade corneal abrasion related to her contact lenses. She will use Visine keep her contacts out and have Lacri-Lube at night.I personally performed the services described in this documentation, which was scribed in my presence. The recorded information has been reviewed and is accurate.  Gross sideeffects, risk and benefits, and alternatives of medications d/w patient. Patient is aware that all medications have potential sideeffects and we are unable to predict every sideeffect or drug-drug interaction that may occur.  Lesle Chris MD 08/05/2015 12:43 PM

## 2015-08-18 ENCOUNTER — Encounter: Payer: Self-pay | Admitting: Emergency Medicine

## 2015-08-22 ENCOUNTER — Other Ambulatory Visit: Payer: Self-pay | Admitting: Emergency Medicine

## 2015-08-30 ENCOUNTER — Encounter: Payer: Self-pay | Admitting: Emergency Medicine

## 2015-10-18 ENCOUNTER — Encounter: Payer: Self-pay | Admitting: Emergency Medicine

## 2015-10-19 ENCOUNTER — Telehealth: Payer: Self-pay

## 2015-10-19 NOTE — Telephone Encounter (Signed)
Pt states she is on 30 MG OF CYMBALTA and have some 20 MG at home, not sure how to take it, didn't know if she should take the 30 MG and have trouble getting off of it Please call [phone]

## 2015-10-21 NOTE — Telephone Encounter (Signed)
Left message for pt to call back  °

## 2015-10-22 NOTE — Telephone Encounter (Signed)
Left message for pt to call back  °

## 2015-11-02 ENCOUNTER — Telehealth: Payer: Self-pay | Admitting: Emergency Medicine

## 2015-11-02 NOTE — Telephone Encounter (Signed)
Call patient and let her know if she is still having trouble with her knee I would be happy to check her. I work Saturday.

## 2015-11-02 NOTE — Telephone Encounter (Signed)
Pt called to say her knee is ok and it wasn't that bad after all, so she won't be coming in to see him on Saturday But she said thanks

## 2015-11-22 ENCOUNTER — Other Ambulatory Visit: Payer: Self-pay

## 2015-11-22 MED ORDER — VALACYCLOVIR HCL 1 G PO TABS: ORAL_TABLET | ORAL | Status: DC

## 2015-12-15 ENCOUNTER — Ambulatory Visit (INDEPENDENT_AMBULATORY_CARE_PROVIDER_SITE_OTHER): Payer: BLUE CROSS/BLUE SHIELD | Admitting: Physician Assistant

## 2015-12-15 VITALS — BP 120/80 | HR 94 | Temp 98.3°F | Resp 20 | Ht 65.35 in | Wt 161.8 lb

## 2015-12-15 DIAGNOSIS — B9789 Other viral agents as the cause of diseases classified elsewhere: Principal | ICD-10-CM

## 2015-12-15 DIAGNOSIS — J069 Acute upper respiratory infection, unspecified: Secondary | ICD-10-CM | POA: Diagnosis not present

## 2015-12-15 MED ORDER — HYDROCODONE-HOMATROPINE 5-1.5 MG/5ML PO SYRP: 2.5000 mL | ORAL_SOLUTION | Freq: Every day | ORAL | Status: AC

## 2015-12-15 NOTE — Progress Notes (Signed)
  Medical screening examination/treatment/procedure(s) were performed by non-physician practitioner and as supervising physician I was immediately available for consultation/collaboration.     

## 2015-12-15 NOTE — Progress Notes (Signed)
12/15/2015 2:35 PM   DOB: 16-Sep-1986 / MRN: [id number]  SUBJECTIVE:  Patricia Morse is a 30 y.o. female presenting for for the evaluation of congestion that started 7  days ago.  Associated symptoms include cough, sore throat and headache today and she denies fever. Treatments tried thus far include OTC preparations with fair  relief. She reports sick contacts.  She is allergic to amoxicillin; tdap; hydrocodeine; and penicillins.   She  has a past medical history of Bilateral sensorineural hearing loss; Auditory neuropathy (BILATERAL); Cochlear implant in place (11-15-2005); History of viral encephalitis (AS BABY); Internal derangement of right knee; PONV (postoperative nausea and vomiting) (POSTOP COCHLEAR  IMPLANT 2006); Seasonal allergies; Depression; and Allergy.    She  reports that she quit smoking about 7 years ago. Her smoking use included Cigarettes. She has never used smokeless tobacco. She reports that she drinks alcohol. She reports that she does not use illicit drugs. She  reports that she currently engages in sexual activity. She reports using the following method of birth control/protection: Pill. The patient  has past surgical history that includes Cochlear implant (11-15-2005  (AGE 73)); BILATERAL MIDDLE EAR EXPLORATION/ SEALING OF OVAL AND ROUND WINDOW FISTULAS (1999 (AGE 20)); Wisdom tooth extraction (AGE 23); Knee arthroscopy (03/13/2012); and Chondroplasty (03/13/2012).  Her family history includes Depression in her mother; Hypertension in her mother; Mental illness in her mother.  Review of Systems  Constitutional: Positive for malaise/fatigue. Negative for fever, chills and diaphoresis.  HENT: Positive for congestion and sore throat.   Respiratory: Positive for cough. Negative for hemoptysis, shortness of breath and wheezing.   Cardiovascular: Negative for chest pain.  Gastrointestinal: Negative for nausea.  Skin: Negative for rash.  Neurological: Negative for dizziness  and weakness.  Endo/Heme/Allergies: Negative for polydipsia.    Problem list and medications reviewed and updated by myself where necessary, and exist elsewhere in the encounter.   OBJECTIVE:  BP 120/80 mmHg  Pulse 94  Temp(Src) 98.3 F (36.8 C) (Oral)  Resp 20  Ht 5' 5.35" (1.66 m)  Wt 161 lb 12.8 oz (73.392 kg)  BMI 26.63 kg/m2  SpO2 99%  LMP 11/29/2015  Physical Exam  Constitutional: She is oriented to person, place, and time.  HENT:  Right Ear: External ear normal.  Left Ear: External ear normal.  Nose: Mucosal edema present. Right sinus exhibits no maxillary sinus tenderness and no frontal sinus tenderness. Left sinus exhibits no maxillary sinus tenderness and no frontal sinus tenderness.  Mouth/Throat: Oropharynx is clear and moist. No oropharyngeal exudate.  Eyes: Conjunctivae are normal. Pupils are equal, round, and reactive to light.  Cardiovascular: Regular rhythm and normal heart sounds.   Pulmonary/Chest: Effort normal and breath sounds normal. She has no rales.  Neurological: She is alert and oriented to person, place, and time.  Skin: Skin is warm and dry. No rash noted. She is not diaphoretic. No erythema.  Psychiatric: Her behavior is normal.    No results found for this or any previous visit (from the past 48 hour(s)).  ASSESSMENT AND PLAN:  Quinn was seen today for sinus problem, sore throat, cough and headache.  Diagnoses and all orders for this visit:  Viral URI with cough: No alarm symptoms.  Normal exam and reassuring vitals.  Will treat symptomatically.  RTC as needed.   -     HYDROcodone-homatropine (HYCODAN) 5-1.5 MG/5ML syrup; Take 2.5-5 mLs by mouth at bedtime.  Other orders -     Cancel: cetirizine-pseudoephedrine (ZYRTEC-D ALLERGY &  CONGESTION) 5-120 MG tablet; Take 1 tablet by mouth 2 (two) times daily.    The patient was advised to call or return to clinic if she does not see an improvement in symptoms or to seek the care of the  closest emergency department if she worsens with the above plan.   Deliah Boston, MHS, PA-C [facility] 12/15/2015 2:35 PM

## 2015-12-15 NOTE — Patient Instructions (Signed)
-   You have a viral upper respiratory infection, (the common cold) and it is not uncommon for symptoms to last 10 days to 2 weeks, regardless of which medications you take. Unfortunately antibiotics will not help your symptoms as they target bacteria, and you are likely suffering from a virus. Additionally, 1 in 8 people who take antibiotics suffer mild to severe side effects.    - You would benefit from high dose ibuprofen. TAKE 600-800 mg of ibuprofen every 8 hours as needed to control low grade fever, fatigue, and pain.    - I also recommend that you take Zyrtec-D 5-120 every morning or Claritin-D 10-240 for the next five to 10 days. This medication will help you with nasal congestion, post nasal drip and sneezing. You will have to purchase this directly from the pharmacist   Viral URI Medications: Please consult the pharmacist if you have questions. 600-800 mg ibuprofen every 8 hours as needed for the next five to ten days 5-120 Zyrtec-D every morning for five to 10 days OR Claritin D 10-240 every morning for five days.  Please visit the CDC's website https://www.cdc.gov/getsmart/ to learn about appropriate and inappropriate uses of antibiotics.    

## 2015-12-15 NOTE — Addendum Note (Signed)
Addended by: Carmelina Dane on: 12/15/2015 02:54 PM   Modules accepted: Kipp Brood

## 2016-02-19 ENCOUNTER — Encounter: Payer: Self-pay | Admitting: Emergency Medicine

## 2016-02-21 ENCOUNTER — Other Ambulatory Visit: Payer: Self-pay | Admitting: Emergency Medicine

## 2016-02-21 DIAGNOSIS — B009 Herpesviral infection, unspecified: Secondary | ICD-10-CM

## 2016-02-21 MED ORDER — VALACYCLOVIR HCL 500 MG PO TABS: 500.0000 mg | ORAL_TABLET | Freq: Every day | ORAL | Status: DC

## 2016-03-06 ENCOUNTER — Ambulatory Visit (INDEPENDENT_AMBULATORY_CARE_PROVIDER_SITE_OTHER): Payer: BLUE CROSS/BLUE SHIELD | Admitting: Physician Assistant

## 2016-03-06 VITALS — BP 122/82 | HR 111 | Temp 99.0°F | Resp 16 | Ht 65.0 in | Wt 163.2 lb

## 2016-03-06 DIAGNOSIS — J101 Influenza due to other identified influenza virus with other respiratory manifestations: Secondary | ICD-10-CM | POA: Diagnosis not present

## 2016-03-06 DIAGNOSIS — R509 Fever, unspecified: Secondary | ICD-10-CM | POA: Diagnosis not present

## 2016-03-06 LAB — POCT CBC
GRANULOCYTE PERCENT: 86.2 % — AB (ref 37–80)
HCT, POC: 38 % (ref 37.7–47.9)
HEMOGLOBIN: 13.6 g/dL (ref 12.2–16.2)
Lymph, poc: 0.9 (ref 0.6–3.4)
MCH: 30.7 pg (ref 27–31.2)
MCHC: 35.8 g/dL — AB (ref 31.8–35.4)
MCV: 85.8 fL (ref 80–97)
MID (cbc): 0.6 (ref 0–0.9)
MPV: 8.3 fL (ref 0–99.8)
PLATELET COUNT, POC: 187 10*3/uL (ref 142–424)
POC Granulocyte: 9.1 — AB (ref 2–6.9)
POC LYMPH PERCENT: 8.2 %L — AB (ref 10–50)
POC MID %: 5.6 %M (ref 0–12)
RBC: 4.43 M/uL (ref 4.04–5.48)
RDW, POC: 13 %
WBC: 10.6 10*3/uL — AB (ref 4.6–10.2)

## 2016-03-06 LAB — POCT INFLUENZA A/B
INFLUENZA B, POC: POSITIVE — AB
Influenza A, POC: NEGATIVE

## 2016-03-06 MED ORDER — OSELTAMIVIR PHOSPHATE 75 MG PO CAPS
75.0000 mg | ORAL_CAPSULE | Freq: Two times a day (BID) | ORAL | Status: DC
Start: 2016-03-06 — End: 2016-04-25

## 2016-03-06 NOTE — Progress Notes (Signed)
[facility] [address] [place] [address] [phone]  Date:  03/06/2016   Name:  Patricia Morse   DOB:  March 08, 1986   MRN:  [id number]  PCP:  Lucilla Edin, MD    Chief Complaint: Fever; Generalized Body Aches; Anorexia; Headache; and Nasal Congestion   History of Present Illness:  This is a 30 y.o. female with PMH obesity, depression herpes labialis who is presenting with 2 days of nasal congestion, headache, decreased appetite, body aches, fever. She has been checking temp at home and up to 100.5. Was 100.5 a few hours ago. Took tylenol about an hour ago and now temp 99 here in office. Denies sob or wheezing.  Aggravating/alleviating factors: taking nyquil and dayquil and helping. States when it wears off she feels terrible. History of asthma: no History of env allergies: yes, thought her symptoms were due to allergies at first. 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  Review of Systems:  Review of Systems See HPI  Patient Active Problem List   Diagnosis Date Noted  . Obesity, unspecified 06/08/2013  . Depression 10/10/2012  . Hearing loss 10/10/2012    Prior to Admission medications   Medication Sig Start Date End Date Taking? Authorizing Provider  benzoyl peroxide 10 % gel Apply topically daily.   Yes Historical Provider, MD  cetirizine (ZYRTEC) 10 MG tablet Take 10 mg by mouth daily.   Yes Historical Provider, MD  desogestrel-ethinyl estradiol (APRI,EMOQUETTE,SOLIA) 0.15-30 MG-MCG tablet Take 1 tablet by mouth daily.   Yes Historical Provider, MD  DULoxetine (CYMBALTA) 30 MG capsule TAKE 1 CAPSULE BY MOUTH DAILY 08/23/15  Yes Collene Gobble, MD  valACYclovir (VALTREX) 1000 MG tablet TAKE 2 TABLETS TWICE A DAY FOR 2 DOSES FOR COLD SORES 11/22/15  Yes Collene Gobble, MD  valACYclovir (VALTREX) 500 MG tablet Take 1 tablet (500 mg total) by mouth daily. 02/21/16  Yes Collene Gobble, MD    Allergies  Allergen Reactions  .  Amoxicillin Hives  . Tdap [Diphth-Acell Pertussis-Tetanus]   . Hydrocodeine [Dihydrocodeine] Rash  . Penicillins Rash    Past Surgical History  Procedure Laterality Date  . Cochlear implant  11-15-2005  (AGE 16)    LEFT EAR  (CLARION HIGH-RESOLUTION 90K MULTICHANNEL,  1-J ELECTRODE)  . 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 Wisdom tooth extraction  AGE 55    DENTAL OFFICE  . Knee arthroscopy  03/13/2012    Procedure: ARTHROSCOPY KNEE;  Surgeon: Shelda Pal, MD;  Location: [facility];  Service: Orthopedics;  Laterality: Right;  RIGHT KNEE DIAGNOSTIC AND OPERATIVE SCOPE  . Chondroplasty  03/13/2012    Procedure: CHONDROPLASTY;  Surgeon: Shelda Pal, MD;  Location: [facility];  Service: Orthopedics;  Laterality: Right;    Social History  Substance Use Topics  . Smoking status: Former Smoker    Types: Cigarettes    Quit date: 03/10/2008  . Smokeless tobacco: Never Used     Comment: SOCIAL SMOKER IN COLLEGE FOR 90YRS -- NO SMOKING SINCE  . Alcohol Use: Yes     Comment: OCCASIONAL     Family History  Problem Relation Age of Onset  . Depression Mother   . Hypertension Mother   . Mental illness Mother     Medication list has been reviewed and updated.  Physical Examination:  Physical Exam  Constitutional: She is oriented to person, place, and time. She appears well-developed and well-nourished. No distress.  HENT:  Head: Normocephalic and atraumatic.  Right Ear: Tympanic membrane, external ear and ear canal normal.  Left Ear: Tympanic membrane, external ear and ear canal normal.  Nose: Nose normal.  Mouth/Throat: Uvula is midline, oropharynx is clear and moist and mucous membranes are normal.  Hearing difficulty Hearing aid present in left ear.  Eyes: Conjunctivae and lids are normal. Right eye exhibits no discharge. Left eye exhibits no discharge. No scleral  icterus.  Cardiovascular: Normal rate, regular rhythm, normal heart sounds and normal pulses.   No murmur heard. Pulmonary/Chest: Effort normal and breath sounds normal. No respiratory distress. She has no wheezes. She has no rhonchi. She has no rales.  Musculoskeletal: Normal range of motion.  Lymphadenopathy:       Head (right side): No submental, no submandibular and no tonsillar adenopathy present.       Head (left side): No submental, no submandibular and no tonsillar adenopathy present.    She has no cervical adenopathy.  Neurological: She is alert and oriented to person, place, and time.  Skin: Skin is warm, dry and intact. No lesion and no rash noted.  Psychiatric: She has a normal mood and affect. Her speech is normal and behavior is normal. Thought content normal.   BP 122/82 mmHg  Pulse 111  Temp(Src) 99 F (37.2 C) (Oral)  Resp 16  Ht 5\' 5"  (1.651 m)  Wt 163 lb 3.2 oz (74.027 kg)  BMI 27.16 kg/m2  SpO2 98%  Results for orders placed or performed in visit on 03/06/16  POCT Influenza A/B  Result Value Ref Range   Influenza A, POC Negative Negative   Influenza B, POC Positive (A) Negative  POCT CBC  Result Value Ref Range   WBC 10.6 (A) 4.6 - 10.2 K/uL   Lymph, poc 0.9 0.6 - 3.4   POC LYMPH PERCENT 8.2 (A) 10 - 50 %L   MID (cbc) 0.6 0 - 0.9   POC MID % 5.6 0 - 12 %M   POC Granulocyte 9.1 (A) 2 - 6.9   Granulocyte percent 86.2 (A) 37 - 80 %G   RBC 4.43 4.04 - 5.48 M/uL   Hemoglobin 13.6 12.2 - 16.2 g/dL   HCT, POC 16.138.0 09.637.7 - 47.9 %   MCV 85.8 80 - 97 fL   MCH, POC 30.7 27 - 31.2 pg   MCHC 35.8 (A) 31.8 - 35.4 g/dL   RDW, POC 04.513.0 %   Platelet Count, POC 187 142 - 424 K/uL   MPV 8.3 0 - 99.8 fL    Assessment and Plan:  1. Fever, unspecified 2. Influenza B Influenza B+. Treat with tamiflu bid x 5 days. Counseled on supportive care. Return in 1 week if symptoms do not improve or at any time if symptoms worsen.  - POCT Influenza A/B - POCT CBC -  oseltamivir (TAMIFLU) 75 MG capsule; Take 1 capsule (75 mg total) by mouth 2 (two) times daily.  Dispense: 10 capsule; Refill: 0   Roswell MinersNicole V. Dyke BrackettBush, PA-C, MHS [facility]  03/06/2016

## 2016-03-06 NOTE — Patient Instructions (Addendum)
Drink plenty of water (64 oz/day) and get plenty of rest. If you have been prescribed a cough syrup, do not drive or operate heavy machinery while using this medication. Take tamiflu twice a day for 5 days if you decide to take. Stay out of work for the week. Ibuprofen/tylenol for body aches and fever If your symptoms are not improving in 1 week, return to clinic.     IF you received an x-ray today, you will receive an invoice from [facility]. Please contact [facility] at [phone] with questions or concerns regarding your invoice.   IF you received labwork today, you will receive an invoice from [facility]. Please contact [facility] at [phone] with questions or concerns regarding your invoice.   Our billing staff will not be able to assist you with questions regarding bills from these companies.  You will be contacted with the lab results as soon as they are available. The fastest way to get your results is to activate your My Chart account. Instructions are located on the last page of this paperwork. If you have not heard from us regarding the results in 2 weeks, please contact this office.

## 2016-03-07 ENCOUNTER — Encounter: Payer: Self-pay | Admitting: Emergency Medicine

## 2016-04-25 ENCOUNTER — Ambulatory Visit (INDEPENDENT_AMBULATORY_CARE_PROVIDER_SITE_OTHER): Payer: BLUE CROSS/BLUE SHIELD | Admitting: Physician Assistant

## 2016-04-25 VITALS — BP 110/72 | HR 82 | Temp 97.9°F | Resp 18 | Ht 65.0 in | Wt 165.0 lb

## 2016-04-25 DIAGNOSIS — R42 Dizziness and giddiness: Secondary | ICD-10-CM | POA: Diagnosis not present

## 2016-04-25 DIAGNOSIS — F329 Major depressive disorder, single episode, unspecified: Secondary | ICD-10-CM

## 2016-04-25 DIAGNOSIS — H539 Unspecified visual disturbance: Secondary | ICD-10-CM | POA: Insufficient documentation

## 2016-04-25 DIAGNOSIS — Z13220 Encounter for screening for lipoid disorders: Secondary | ICD-10-CM | POA: Diagnosis not present

## 2016-04-25 DIAGNOSIS — H9190 Unspecified hearing loss, unspecified ear: Secondary | ICD-10-CM

## 2016-04-25 DIAGNOSIS — Z13228 Encounter for screening for other metabolic disorders: Secondary | ICD-10-CM | POA: Diagnosis not present

## 2016-04-25 DIAGNOSIS — Z0000 Encounter for general adult medical examination without abnormal findings: Secondary | ICD-10-CM | POA: Diagnosis not present

## 2016-04-25 DIAGNOSIS — F32A Depression, unspecified: Secondary | ICD-10-CM

## 2016-04-25 LAB — COMPREHENSIVE METABOLIC PANEL
ALBUMIN: 3.7 g/dL (ref 3.6–5.1)
ALK PHOS: 54 U/L (ref 33–115)
ALT: 10 U/L (ref 6–29)
AST: 15 U/L (ref 10–30)
BILIRUBIN TOTAL: 0.4 mg/dL (ref 0.2–1.2)
BUN: 11 mg/dL (ref 7–25)
CO2: 20 mmol/L (ref 20–31)
Calcium: 8.5 mg/dL — ABNORMAL LOW (ref 8.6–10.2)
Chloride: 105 mmol/L (ref 98–110)
Creat: 0.71 mg/dL (ref 0.50–1.10)
GLUCOSE: 84 mg/dL (ref 65–99)
POTASSIUM: 4.1 mmol/L (ref 3.5–5.3)
Sodium: 137 mmol/L (ref 135–146)
TOTAL PROTEIN: 6.7 g/dL (ref 6.1–8.1)

## 2016-04-25 LAB — LIPID PANEL
CHOL/HDL RATIO: 2.7 ratio (ref <=5.0)
CHOLESTEROL: 222 mg/dL — AB (ref 125–200)
HDL: 81 mg/dL (ref >=46)
LDL Cholesterol: 113 mg/dL (ref <130)
TRIGLYCERIDES: 141 mg/dL (ref <150)
VLDL: 28 mg/dL (ref <30)

## 2016-04-25 MED ORDER — DULOXETINE HCL 60 MG PO CPEP: 60.0000 mg | ORAL_CAPSULE | Freq: Every day | ORAL | Status: DC

## 2016-04-25 MED ORDER — DULOXETINE HCL 40 MG PO CPEP: 40.0000 mg | ORAL_CAPSULE | Freq: Every day | ORAL | Status: DC

## 2016-04-25 NOTE — Progress Notes (Signed)
Patient ID: Patricia Morse, female    DOB: Jan 23, 1986, 30 y.o.   MRN: [id number]  PCP: Lucilla Edin, MD  Chief Complaint  Patient presents with  . Annual Exam    balance issues    Subjective:   HPI: Patient is a 30 year old female who presents today for annual exam with complaints of "balance issues."   Admits that the balance problem is chronic, previously evaluated by several specialists, that has progressively worsened over time, and is constant with standing and ambulation and occasional dizzy spells while sitting and lying. States "I have difficultly standing straight, it feels like I am drunk all the time. 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   Patient admits to increase in anxiety attacks in last year because of increaed stress related to school, feels that current medical regimen does not adequately control her symptoms.   Patient states she does not require pelvic exam or STI testing at this visit, it is completed through her OB/GYN.  Denies illness in interval between visits, pain, change in bowel or bladder habits.   Cervical Cancer Screening: uncertain date, but done with GYN Breast Cancer Screening: CBE with GYN. Not yet a candidate for screening mammography Colorectal Cancer Screening: Not yet a candidate Bone Density Testing: Not yet a candidate HIV Screening: per patient, with GYN STI Screening: per patient, with GYN Seasonal Influenza Vaccination: uncertain. Recommend annually in the fall. Td/Tdap Vaccination: reportedly current. Will check [facility]. Pneumococcal Vaccination: Not yet a candidate Zoster Vaccination: Not yet a candidate    Patient Active Problem List   Diagnosis Date Noted  . Obesity, unspecified 06/08/2013  . Depression 10/10/2012  . Hearing  loss 10/10/2012    Past Medical History  Diagnosis Date  . Bilateral sensorineural hearing loss     SECONDARY AUDITORY NEUROPATHY  . Auditory neuropathy BILATERAL  . 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 Internal derangement of right knee   . PONV (postoperative nausea and vomiting) POSTOP COCHLEAR  IMPLANT 2006  . Seasonal allergies   . Depression   . Allergy      Prior to Admission medications   Medication Sig Start Date End Date Taking? Authorizing Provider  cetirizine (ZYRTEC) 10 MG tablet Take 10 mg by mouth daily.   Yes Historical Provider, MD  desogestrel-ethinyl estradiol (APRI,EMOQUETTE,SOLIA) 0.15-30 MG-MCG tablet Take 1 tablet by mouth daily.   Yes Historical Provider, MD  DULoxetine (CYMBALTA) 30 MG capsule TAKE 1 CAPSULE BY MOUTH DAILY 08/23/15  Yes Collene Gobble, MD  valACYclovir (VALTREX) 500 MG tablet Take 1 tablet (500 mg total) by mouth daily. 02/21/16  Yes Collene Gobble, MD  benzoyl peroxide 10 % gel Apply topically daily. Reported on 04/25/2016    Historical Provider, MD  oseltamivir (TAMIFLU) 75 MG capsule Take 1 capsule (75 mg total) by mouth 2 (two) times daily. Patient not taking: Reported on 04/25/2016 03/06/16   Dorna Leitz, PA-C  valACYclovir (VALTREX) 1000 MG tablet TAKE 2 TABLETS TWICE A DAY FOR 2 DOSES FOR COLD SORES Patient not taking: Reported on 04/25/2016 11/22/15   Collene Gobble, MD    Allergies  Allergen Reactions  . Amoxicillin Hives  . Tdap [  Diphth-Acell Pertussis-Tetanus]   . Hydrocodeine [Dihydrocodeine] Rash  . Penicillins Rash    Past Surgical History  Procedure Laterality Date  . Cochlear implant  11-15-2005  (AGE 24)    LEFT EAR  (CLARION HIGH-RESOLUTION 90K MULTICHANNEL,  1-J ELECTRODE)  . 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 Wisdom tooth extraction  AGE 24     DENTAL OFFICE  . Knee arthroscopy  03/13/2012    Procedure: ARTHROSCOPY KNEE;  Surgeon: Shelda PalMatthew D Olin, MD;  [facility] [place];  Service: Orthopedics;  Laterality: Right;  RIGHT KNEE DIAGNOSTIC AND OPERATIVE SCOPE  . Chondroplasty  03/13/2012    Procedure: CHONDROPLASTY;  Surgeon: Shelda PalMatthew D Olin, MD;  [facility] [place];  Service: Orthopedics;  Laterality: Right;    Family History  Problem Relation Age of Onset  . Depression Mother   . Hypertension Mother   . Mental illness Mother     Social History   Social History  . Marital Status: Single    Spouse Name: N/A  . Number of Children: N/A  . Years of Education: N/A   Occupational History  . Claims Professional for [employer]    Social History Main Topics  . Smoking status: Former Smoker    Types: Cigarettes    Quit date: 03/10/2008  . Smokeless tobacco: Never Used     Comment: SOCIAL SMOKER IN COLLEGE FOR 36YRS -- NO SMOKING SINCE  . Alcohol Use: Yes     Comment: OCCASIONAL   . Drug Use: No  . Sexual Activity: Yes    Birth Control/ Protection: Pill   Other Topics Concern  . None   Social History Narrative       Review of Systems Constitutional: Negative.  HENT: Positive for hearing loss.  Eyes: Positive for visual disturbance.  Respiratory: Negative.  Cardiovascular: Negative.  Neurological: Positive for dizziness and light-headedness.  Psychiatric/Behavioral: Positive for decreased concentration. Negative for hallucinations, behavioral problems, confusion and self-injury. The patient is nervous/anxious.     Objective:  Physical Exam  Constitutional: She is oriented to person, place, and time. Vital signs are normal. She appears well-developed and well-nourished. She is active and cooperative. No distress.  BP 110/72 mmHg  Pulse 82  Temp(Src) 97.9 F (36.6 C) (Oral)  Resp 18  Ht 5\' 5"  (1.651 m)  Wt 165 lb (74.844 kg)  BMI 27.46 kg/m2  SpO2 100%  LMP 04/03/2016     HENT:  Head: Normocephalic and atraumatic.  Right Ear: Decreased hearing is noted.  Left Ear: Decreased hearing is noted.  Nose: Nose normal.  Mouth/Throat: Uvula is midline, oropharynx is clear and moist and mucous membranes are normal. No oral lesions. Normal dentition. No dental abscesses or uvula swelling. No oropharyngeal exudate.  Eyes: Conjunctivae, EOM and lids are normal. Pupils are equal, round, and reactive to light. Right eye exhibits no discharge. Left eye exhibits no discharge. No scleral icterus.  Neck: Trachea normal, normal range of motion and full passive range of motion without pain. Neck supple. No spinous process tenderness and no muscular tenderness present. No thyroid mass and no thyromegaly present.  Cardiovascular: Normal rate, regular rhythm, normal heart sounds, intact distal pulses and normal pulses.   Pulmonary/Chest: Effort normal and breath sounds normal.  Musculoskeletal: She exhibits no edema or tenderness.       Cervical back: Normal.       Thoracic back: Normal.  Lumbar back: Normal.  Lymphadenopathy:       Head (right side): No tonsillar, no preauricular, no posterior auricular and no occipital adenopathy present.       Head (left side): No tonsillar, no preauricular, no posterior auricular and no occipital adenopathy present.    She has no cervical adenopathy.       Right: No supraclavicular adenopathy present.       Left: No supraclavicular adenopathy present.  Neurological: She is alert and oriented to person, place, and time. She has normal strength and normal reflexes. No cranial nerve deficit. She exhibits normal muscle tone. Coordination and gait normal.  Skin: Skin is warm, dry and intact. No rash noted. She is not diaphoretic. No cyanosis or erythema. Nails show no clubbing.  Psychiatric: Her speech is normal and behavior is normal. Judgment and thought content normal. Her mood appears anxious. Her affect is not angry, not blunt, not labile  and not inappropriate. She exhibits a depressed mood.           Assessment & Plan:  1. Annual physical exam Age appropriate anticipatory guidance provided.  2. Screening for hyperlipidemia - Lipid panel  3. Screening for metabolic disorder - Comprehensive metabolic panel  4. Dizziness 5. Hearing loss, unspecified laterality Advise her to follow-up with Dr. Haroldine Laws.   6. Visual disturbance Previously evaluated by eye specialist. Recommend another evaluation, possibly with an alternate specialist.  7. Depression Increase current medication to 40 mg daily. Can then increase to 60 mg, if needed. - DULoxetine 40 MG CPEP; Take 40 mg by mouth daily.  Dispense: 30 capsule; Refill: 12   Fernande Bras, PA-C Physician Assistant-Certified [facility]

## 2016-04-25 NOTE — Progress Notes (Signed)
Subjective:    Patient ID: Patricia Morse, female    DOB: 03-Aug-1986, 30 y.o.   MRN: [id number]  Chief Complaint  Patient presents with  . Annual Exam    balance issues   HPI   Patient is a 30 year old female who presents today for annual exam with complaints of "balance issues." Admits it is a chronic issue, that has progressively worsened over time, and is constant with standing and ambulation and occasional dizzy spells while sitting and lying.  States "I have difficultly standing straight, it feels like I am drunk all the time.  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    Patient admits to increase in anxiety attacks in last year because of increaed stress related to school, feels that current medical regimen does not adequately control her symptoms.    Patient states she does not require pelvic exam or STI testing at this visit, it is completed through her OB/GYN.  Denies illness in interval between visits, pain, change in bowel or bladder habits.   Review of Systems  Constitutional: Negative.   HENT: Positive for hearing loss.   Eyes: Positive for visual disturbance.  Respiratory: Negative.   Cardiovascular: Negative.   Neurological: Positive for dizziness and light-headedness.  Psychiatric/Behavioral: Positive for decreased concentration. Negative for hallucinations, behavioral problems, confusion and self-injury. The patient is nervous/anxious.     Patient Active Problem List   Diagnosis Date Noted  . Obesity, unspecified 06/08/2013  . Depression 10/10/2012  . Hearing loss 10/10/2012    Current Outpatient Prescriptions on File Prior to Visit  Medication Sig Dispense Refill  . cetirizine (ZYRTEC) 10 MG tablet Take 10 mg by mouth daily.    Marland Kitchen desogestrel-ethinyl estradiol  (APRI,EMOQUETTE,SOLIA) 0.15-30 MG-MCG tablet Take 1 tablet by mouth daily.    . DULoxetine (CYMBALTA) 30 MG capsule TAKE 1 CAPSULE BY MOUTH DAILY 30 capsule 10  . valACYclovir (VALTREX) 500 MG tablet Take 1 tablet (500 mg total) by mouth daily. 30 tablet 11  . benzoyl peroxide 10 % gel Apply topically daily. Reported on 04/25/2016    . oseltamivir (TAMIFLU) 75 MG capsule Take 1 capsule (75 mg total) by mouth 2 (two) times daily. (Patient not taking: Reported on 04/25/2016) 10 capsule 0  . valACYclovir (VALTREX) 1000 MG tablet TAKE 2 TABLETS TWICE A DAY FOR 2 DOSES FOR COLD SORES (Patient not taking: Reported on 04/25/2016) 30 tablet 7   No current facility-administered medications on file prior to visit.    Allergies  Allergen Reactions  . Amoxicillin Hives  . Tdap [Diphth-Acell Pertussis-Tetanus]   . Hydrocodeine [Dihydrocodeine] Rash  . Penicillins Rash    Social History   Social History  . Marital Status: Single    Spouse Name: N/A  . Number of Children: N/A  . Years of Education: N/A   Occupational History  . Claims Professional for [employer]    Social History Main Topics  . Smoking status: Former Smoker    Types: Cigarettes    Quit date: 03/10/2008  . Smokeless tobacco: Never Used     Comment: SOCIAL SMOKER IN COLLEGE FOR 66YRS -- NO SMOKING SINCE  . Alcohol Use: Yes     Comment: OCCASIONAL   . Drug Use: No  . Sexual Activity: Yes    Birth Control/ Protection: Pill   Other Topics Concern  .  Not on file   Social History Narrative    Family History  Problem Relation Age of Onset  . Depression Mother   . Hypertension Mother   . Mental illness Mother       Objective:   Physical Exam  Constitutional: She is oriented to person, place, and time. She appears well-developed and well-nourished.  HENT:  Head: Normocephalic.  Right Ear: Decreased hearing is noted.  Left Ear: Decreased hearing is noted.  Mouth/Throat: Oropharynx is clear and moist.  Eyes:  Conjunctivae and EOM are normal. Pupils are equal, round, and reactive to light.  Neck: Normal range of motion. Neck supple. No tracheal deviation present. No thyromegaly present.  Cardiovascular: Normal rate, regular rhythm and intact distal pulses.  Exam reveals friction rub. Exam reveals no gallop.   No murmur heard. Pulmonary/Chest: Effort normal and breath sounds normal.  Musculoskeletal: Normal range of motion. She exhibits no edema or tenderness.  Neurological: She is alert and oriented to person, place, and time. She displays abnormal reflex.  Skin: Skin is warm and dry.  Psychiatric: Her mood appears anxious. She exhibits a depressed mood.   Pelvic and breast exam not performed, completed by GYN, Debbora DusBeth Lane       Assessment & Plan:  1. Annual physical exam -Patient received education on keeping her healthy, including important screening test, immunizations, vitamins, and everyday activities such as diet and exercise and seat belt use.  -Pelvic exam, STI, and HIV testing deferred to GYN. -Patient informed to check the last date of Td/Tdap, if it has been 10 years, a booster is recommended.    2. Screening for hyperlipidemia - Lipid panel drawn, results pending.  3. Screening for metabolic disorder - Comprehensive metabolic panel drawn, results pending.  4. Dizziness -Chronic, ongoing condition.  Follow up with Dr. Jac CanavanKrauss to re-evaluate and determine if other management applicable.  5. 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  6. Visual disturbance - Previously evaluated by optomologist, patient informed if she would like a 2nd opinion, this office will provide information for those recommended.  7. Depression -Discussed management options with patient, and she decided to increase the dosage of her  current medication for better symptom control. - Increased dosage duloxetine from 30mg  to 40mg  daily. - DULoxetine 40 MG CPEP; Take 40 mg by mouth daily.  Dispense: 30 capsule; Refill: 12  Sharita Bienaime P. Ambyr Qadri, PA-S

## 2016-04-25 NOTE — Patient Instructions (Addendum)
IF you received an x-ray today, you will receive an invoice from [facility]. Please contact [facility] at [phone] with questions or concerns regarding your invoice.   IF you received labwork today, you will receive an invoice from [facility]. Please contact [facility] at [phone] with questions or concerns regarding your invoice.   Our billing staff will not be able to assist you with questions regarding bills from these companies.  You will be contacted with the lab results as soon as they are available. The fastest way to get your results is to activate your My Chart account. Instructions are located on the last page of this paperwork. If you have not heard from us regarding the results in 2 weeks, please contact this office.    Please check the date of your last Td/Tdap. If it has been 10 years, you need a booster (Td, if you already had the Tdap).  Please contact Dr. Jac CanavanKrauss to schedule a follow up on the dizziness.  Keeping You Healthy  Get These Tests 1. Blood Pressure- Have your blood pressure checked once a year by your health care provider.  Normal blood pressure is 120/80. 2. Weight- Have your body mass index (BMI) calculated to screen for obesity.  BMI is measure of body fat based on height and weight.  You can also calculate your own BMI at https://www.west-esparza.com/www.nhlbisupport.com/bmi/. 3. Cholesterol- Have your cholesterol checked every 5 years starting at age 28 then yearly starting at age 28. 4. Chlamydia, HIV, and other sexually transmitted diseases- Get screened every year until age 28, then within three months of each new sexual provider. 5. Pap Test - Every 1-5 years; discuss with your health care provider. 6. Mammogram- Every 1-2 years starting at age 28--50  Take these medicines  Calcium with Vitamin D-Your body needs 1200 mg of Calcium each day and [phone] IU of Vitamin D daily.  Your body can only absorb 500 mg of Calcium at a  time so Calcium must be taken in 2 or 3 divided doses throughout the day.  Multivitamin with folic acid- Once daily if it is possible for you to become pregnant.  Get these Immunizations  Gardasil-Series of three doses; prevents HPV related illness such as genital warts and cervical cancer.  Menactra-Single dose; prevents meningitis.  Tetanus shot- Every 10 years.  Flu shot-Every year.  Take these steps 1. Do not smoke-Your healthcare provider can help you quit.  For tips on how to quit go to www.smokefree.gov or call 1-800 QUITNOW. 2. Be physically active- Exercise 5 days a week for at least 30 minutes.  If you are not already physically active, start slow and gradually work up to 30 minutes of moderate physical activity.  Examples of moderate activity include walking briskly, dancing, swimming, bicycling, etc. 3. Breast Cancer- A self breast exam every month is important for early detection of breast cancer.  For more information and instruction on self breast exams, ask your healthcare provider or [place].es[place]om[place]health.gov/faq/breast-self-exam.cfm. 4. Eat a healthy diet- Eat a variety of healthy foods such as fruits, vegetables, whole grains, low fat milk, low fat cheeses, yogurt, lean meats, poultry and fish, beans, nuts, tofu, etc.  For more information go to www. Thenutritionsource.org 5. Drink alcohol in moderation- Limit alcohol intake to one drink or less per day. Never drink and drive. 6. Depression- Your emotional health is as important as your physical health.  If you're feeling down or losing interest in things you normally  enjoy please talk to your healthcare provider about being screened for depression. 7. Dental visit- Brush and floss your teeth twice daily; visit your dentist twice a year. 8. Eye doctor- Get an eye exam at least every 2 years. 9. Helmet use- Always wear a helmet when riding a bicycle, motorcycle, rollerblading or skateboarding. 10. Safe sex- If you may be exposed  to sexually transmitted infections, use a condom. 11. Seat belts- Seat belts can save your live; always wear one. 12. Smoke/Carbon Monoxide detectors- These detectors need to be installed on the appropriate level of your home. Replace batteries at least once a year. 13. Skin cancer- When out in the sun please cover up and use sunscreen 15 SPF or higher. 14. Violence- If anyone is threatening or hurting you, please tell your healthcare provider.

## 2016-05-19 ENCOUNTER — Other Ambulatory Visit: Payer: Self-pay | Admitting: Emergency Medicine

## 2016-05-19 ENCOUNTER — Encounter: Payer: Self-pay | Admitting: Emergency Medicine

## 2016-05-22 ENCOUNTER — Other Ambulatory Visit: Payer: Self-pay | Admitting: Emergency Medicine

## 2016-05-22 DIAGNOSIS — F32A Depression, unspecified: Secondary | ICD-10-CM

## 2016-05-22 DIAGNOSIS — F329 Major depressive disorder, single episode, unspecified: Secondary | ICD-10-CM

## 2016-05-22 MED ORDER — DULOXETINE HCL 20 MG PO CPEP: ORAL_CAPSULE | ORAL | Status: DC

## 2016-06-04 DIAGNOSIS — H903 Sensorineural hearing loss, bilateral: Secondary | ICD-10-CM | POA: Diagnosis not present

## 2016-06-04 DIAGNOSIS — H933X3 Disorders of bilateral acoustic nerves: Secondary | ICD-10-CM | POA: Diagnosis not present

## 2016-06-11 DIAGNOSIS — H903 Sensorineural hearing loss, bilateral: Secondary | ICD-10-CM | POA: Diagnosis not present

## 2016-06-11 DIAGNOSIS — H933X3 Disorders of bilateral acoustic nerves: Secondary | ICD-10-CM | POA: Diagnosis not present

## 2016-06-29 ENCOUNTER — Other Ambulatory Visit: Payer: Self-pay | Admitting: Occupational Medicine

## 2016-06-29 ENCOUNTER — Ambulatory Visit: Payer: Self-pay

## 2016-06-29 DIAGNOSIS — M25571 Pain in right ankle and joints of right foot: Secondary | ICD-10-CM

## 2016-08-11 ENCOUNTER — Emergency Department (INDEPENDENT_AMBULATORY_CARE_PROVIDER_SITE_OTHER)
Admission: EM | Admit: 2016-08-11 | Discharge: 2016-08-11 | Disposition: A | Discharge status: Home / Self Care | Payer: Worker's Compensation | Source: Home / Self Care | Attending: Family Medicine | Admitting: Family Medicine

## 2016-08-11 ENCOUNTER — Emergency Department (INDEPENDENT_AMBULATORY_CARE_PROVIDER_SITE_OTHER): Payer: Worker's Compensation

## 2016-08-11 ENCOUNTER — Encounter: Payer: Self-pay | Admitting: Emergency Medicine

## 2016-08-11 DIAGNOSIS — S93401A Sprain of unspecified ligament of right ankle, initial encounter: Secondary | ICD-10-CM

## 2016-08-11 DIAGNOSIS — S99811D Other specified injuries of right ankle, subsequent encounter: Secondary | ICD-10-CM | POA: Diagnosis not present

## 2016-08-11 DIAGNOSIS — X501XXD Overexertion from prolonged static or awkward postures, subsequent encounter: Secondary | ICD-10-CM | POA: Diagnosis not present

## 2016-08-11 DIAGNOSIS — M24271 Disorder of ligament, right ankle: Secondary | ICD-10-CM

## 2016-08-11 NOTE — Discharge Instructions (Signed)
Apply ice pack for 30 minutes every 1 to 2 hours today and tomorrow.  Elevate whenever possible.  Wear Ace wrap until swelling decreases.  Wear brace for about 2 to 3 weeks.  Begin range of motion and stretching exercises in about 5 days as per instruction sheet.  May take two Aleve tabs every 12 hours.

## 2016-08-11 NOTE — ED Provider Notes (Signed)
[facility]    CSN: [id number] Arrival date & time: 08/11/16  1536  First Provider Contact:  First MD Initiated Contact with Patient 08/11/16 1741        History   Chief Complaint Chief Complaint  Patient presents with  . Ankle Pain    HPI Patricia Morse is a 30 y.o. female.   Patient presents for an occupational injury.  While walking in the [employer] parking lot yesterday evening, patient stepped into a pothole and inverted her right ankle.  She has had persistent pain/swelling.  She had also sprained her ankle on 06/29/2016 and complains that her ankle has felt persistently weak and unstable.   The history is provided by the patient.  Ankle Pain  Location:  Ankle Time since incident:  1 day Injury: yes   Mechanism of injury comment:  Inverted ankle Ankle location:  R ankle Pain details:    Quality:  Aching   Radiates to:  Does not radiate   Severity:  Moderate   Onset quality:  Sudden   Duration:  1 day   Timing:  Constant   Progression:  Unchanged Chronicity:  Recurrent Dislocation: no   Prior injury to area:  Yes Relieved by:  Nothing Worsened by:  Bearing weight Ineffective treatments:  Ice Associated symptoms: decreased ROM, stiffness and swelling   Associated symptoms: no back pain, no muscle weakness, no numbness and no tingling     Past Medical History:  Diagnosis Date  . Allergy   . Auditory neuropathy BILATERAL  . Bilateral sensorineural hearing loss    SECONDARY AUDITORY NEUROPATHY  . 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 Depression   . 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 Internal derangement of right knee   . PONV (postoperative nausea and vomiting) POSTOP COCHLEAR  IMPLANT 2006  . Seasonal allergies     Patient Active Problem List   Diagnosis Date Noted  . Visual disturbance 04/25/2016  . Dizziness 04/25/2016  . Obesity, unspecified 06/08/2013  . Depression 10/10/2012  .  Hearing loss 10/10/2012    Past Surgical History:  Procedure Laterality Date  . BILATERAL MIDDLE EAR EXPLORATION/ SEALING OF OVAL AND ROUND WINDOW FISTULAS  1999 (AGE 52)   PERILYMPH FISTULAS  . CHONDROPLASTY  03/13/2012   Procedure: CHONDROPLASTY;  Surgeon: Shelda PalMatthew D Olin, MD;  [facility] [place];  Service: Orthopedics;  Laterality: Right;  . COCHLEAR IMPLANT  11-15-2005  (AGE 66)   LEFT EAR  (CLARION HIGH-RESOLUTION 90K MULTICHANNEL,  1-J ELECTRODE)  . KNEE ARTHROSCOPY  03/13/2012   Procedure: ARTHROSCOPY KNEE;  Surgeon: Shelda PalMatthew D Olin, MD;  [facility] [place];  Service: Orthopedics;  Laterality: Right;  RIGHT KNEE DIAGNOSTIC AND OPERATIVE SCOPE  . WISDOM TOOTH EXTRACTION  AGE 22   DENTAL OFFICE    OB History    No data available       Home Medications    Prior to Admission medications   Medication Sig Start Date End Date Taking? Authorizing Provider  benzoyl peroxide 10 % gel Apply topically daily. Reported on 04/25/2016    Historical Provider, MD  cetirizine (ZYRTEC) 10 MG tablet Take 10 mg by mouth daily.    Historical Provider, MD  desogestrel-ethinyl estradiol (APRI,EMOQUETTE,SOLIA) 0.15-30 MG-MCG tablet Take 1 tablet by mouth daily.    Historical Provider, MD  DULoxetine (CYMBALTA) 20 MG capsule Take 2 capsules daily 05/22/16  Collene Gobble, MD  valACYclovir (VALTREX) 1000 MG tablet TAKE 2 TABLETS TWICE A DAY FOR 2 DOSES FOR COLD SORES Patient not taking: Reported on 04/25/2016 11/22/15   Collene Gobble, MD  valACYclovir (VALTREX) 500 MG tablet Take 1 tablet (500 mg total) by mouth daily. 02/21/16   Collene Gobble, MD    Family History Family History  Problem Relation Age of Onset  . Depression Mother   . Hypertension Mother   . Mental illness Mother     Social History Social History  Substance Use Topics  . Smoking status: Former Smoker    Types: Cigarettes    Quit date: 03/10/2008  . Smokeless tobacco: Never Used     Comment:  SOCIAL SMOKER IN COLLEGE FOR 42YRS -- NO SMOKING SINCE  . Alcohol use Yes     Comment: OCCASIONAL      Allergies   Amoxicillin; Tdap [diphth-acell pertussis-tetanus]; Hydrocodeine [dihydrocodeine]; and Penicillins   Review of Systems Review of Systems  Musculoskeletal: Positive for stiffness. Negative for back pain.  All other systems reviewed and are negative.    Physical Exam Triage Vital Signs ED Triage Vitals  Enc Vitals Group     BP 08/11/16 1650 113/76     Pulse Rate 08/11/16 1650 88     Resp 08/11/16 1650 16     Temp 08/11/16 1650 98 F (36.7 C)     Temp Source 08/11/16 1650 Oral     SpO2 08/11/16 1650 99 %     Weight 08/11/16 1651 170 lb (77.1 kg)     Height 08/11/16 1651 5\' 5"  (1.651 m)     Head Circumference --      Peak Flow --      Pain Score 08/11/16 1653 5     Pain Loc --      Pain Edu? --      Excl. in GC? --    No data found.   Updated Vital Signs BP 113/76 (BP Location: Left Arm)   Pulse 88   Temp 98 F (36.7 C) (Oral)   Resp 16   Ht 5\' 5"  (1.651 m)   Wt 170 lb (77.1 kg)   LMP 07/23/2016   SpO2 99%   BMI 28.29 kg/m   Visual Acuity Right Eye Distance:   Left Eye Distance:   Bilateral Distance:    Right Eye Near:   Left Eye Near:    Bilateral Near:     Physical Exam  Constitutional: She appears well-developed and well-nourished. No distress.  HENT:  Head: Atraumatic.  Eyes: Pupils are equal, round, and reactive to light.  Cardiovascular: Normal heart sounds.   Pulmonary/Chest: Breath sounds normal.  Musculoskeletal:       Right ankle: She exhibits decreased range of motion and swelling. She exhibits no ecchymosis, no deformity, no laceration and normal pulse. Tenderness. Lateral malleolus tenderness found. Achilles tendon normal.       Feet:  Right ankle:  Decreased range of motion.  Tenderness and swelling over the lateral malleolus.   No tenderness over the base of the fifth metatarsal.  Distal neurovascular function is intact.    Neurological: She is alert.  Skin: Skin is warm and dry.  Nursing note and vitals reviewed.    UC Treatments / Results  Labs (all labs ordered are listed, but only abnormal results are displayed) Labs Reviewed - No data to display  EKG  EKG Interpretation None       Radiology No results  found.  Procedures Procedures (including critical care time)  Medications Ordered in UC Medications - No data to display   Initial Impression / Assessment and Plan / UC Course  I have reviewed the triage vital signs and the nursing notes.  Pertinent labs & imaging results that were available during my care of the patient were reviewed by me and considered in my medical decision making (see chart for details).  Clinical Course  Ace wrap and AirCast stirrup splint applied Apply ice pack for 30 minutes every 1 to 2 hours today and tomorrow.  Elevate whenever possible.  Wear Ace wrap until swelling decreases.  Wear brace for about 2 to 3 weeks.  Begin range of motion and stretching exercises in about 5 days as per instruction sheet.  May take two Aleve tabs every 12 hours. Followup with [facility] in 6 days.  Recommend referral to sports medicine for chronic ankle laxity.    Final Clinical Impressions(s) / UC Diagnoses   Final diagnoses:  Right ankle sprain, initial encounter  Ankle ligament laxity, right    New Prescriptions Discharge Medication List as of 08/11/2016  5:58 PM       Lattie Haw, MD 08/16/16 [phone]

## 2016-08-11 NOTE — ED Triage Notes (Signed)
Patient stepped into pothole in parking lot of [employer] last evening and rolled right ankle; she sprained right ankle on 06-29-16.

## 2016-08-17 ENCOUNTER — Emergency Department (INDEPENDENT_AMBULATORY_CARE_PROVIDER_SITE_OTHER)
Admission: EM | Admit: 2016-08-17 | Discharge: 2016-08-17 | Disposition: A | Discharge status: Home / Self Care | Payer: Worker's Compensation | Source: Home / Self Care | Attending: Emergency Medicine | Admitting: Emergency Medicine

## 2016-08-17 ENCOUNTER — Encounter: Payer: Self-pay | Admitting: Emergency Medicine

## 2016-08-17 DIAGNOSIS — S93401D Sprain of unspecified ligament of right ankle, subsequent encounter: Secondary | ICD-10-CM

## 2016-08-17 NOTE — Discharge Instructions (Signed)
You had a very severe right ankle sprain 06/29/2016, with no fracture seen on x-ray and 06/29/2016 and 08/11/2016.You are improving but not completely better.Continue with ankle exercises per printed out instructions and to already have. Wear Ankle brace. Work restrictions: No excessive walking or running or climbing for now, but may do other work activities. Follow-up appointment: [facility] Monday 08/20/16 11:30 AM. Will plan on referring to sports medicine/orthopedist if not better.

## 2016-08-17 NOTE — ED Triage Notes (Signed)
Right ankle follow up, twisted rt ankle at work one week ago, wearing air cast pain is better still sore 3/10, doing exercises as tolerated.

## 2016-08-17 NOTE — ED Provider Notes (Signed)
[facility]    CSN: [id number] Arrival date & time: 08/17/16  [id number]  First Provider Contact:  First MD Initiated Contact with Patient 08/17/16 0915        History   Chief Complaint Chief Complaint  Patient presents with  . Ankle Injury    HPI Patricia Morse is a 30 y.o. female.   HPI Patient presents as a walk-in patient to our urgent care, for follow-up of occupational injury. She had 2 right ankle injuries in the [employer] parking lot where she works. First injury was 06/29/2016, was seen by Dr. Alto DenverHunt, negative x-rays, treated conservatively, and she states that never completely healed and had felt persistently weak and unstable. Then, while walking in the [employer] parking lot 08/10/16, she had another right ankle injury, by accidentally stepping into a pot hole and inverting her right ankle. [facility] by Junius FinnerErin O'Malley, x-rays negative and 08/11/16. She was treated conservatively with air cast, some work restrictions, and ankle exercise program, and she brings in detail print out of that program. She reports that the right lateral ankle pain is gradually improving but still has mild to moderate pain with inversion and excessive walking and climbing. No radiation or paresthesias or focal weakness. She does not feel that she needs any medicine for this.  Although previously advised to follow-up with sports medicine or orthopedist specialist, she states she has not done so because she didn't know how to have this authorized by her Worker's Comp.  Past Medical History:  Diagnosis Date  . Allergy   . Auditory neuropathy BILATERAL  . Bilateral sensorineural hearing loss    SECONDARY AUDITORY NEUROPATHY  . 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 Depression   . 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 Internal derangement of right knee   . PONV (postoperative nausea and vomiting) POSTOP  COCHLEAR  IMPLANT 2006  . Seasonal allergies     Patient Active Problem List   Diagnosis Date Noted  . Visual disturbance 04/25/2016  . Dizziness 04/25/2016  . Obesity, unspecified 06/08/2013  . Depression 10/10/2012  . Hearing loss 10/10/2012    Past Surgical History:  Procedure Laterality Date  . BILATERAL MIDDLE EAR EXPLORATION/ SEALING OF OVAL AND ROUND WINDOW FISTULAS  1999 (AGE 89)   PERILYMPH FISTULAS  . CHONDROPLASTY  03/13/2012   Procedure: CHONDROPLASTY;  Surgeon: Shelda PalMatthew D Olin, MD;  [facility] [place];  Service: Orthopedics;  Laterality: Right;  . COCHLEAR IMPLANT  11-15-2005  (AGE 28)   LEFT EAR  (CLARION HIGH-RESOLUTION 90K MULTICHANNEL,  1-J ELECTRODE)  . KNEE ARTHROSCOPY  03/13/2012   Procedure: ARTHROSCOPY KNEE;  Surgeon: Shelda PalMatthew D Olin, MD;  [facility] [place];  Service: Orthopedics;  Laterality: Right;  RIGHT KNEE DIAGNOSTIC AND OPERATIVE SCOPE  . WISDOM TOOTH EXTRACTION  AGE 67   DENTAL OFFICE    OB History    No data available       Home Medications    Prior to Admission medications   Medication Sig Start Date End Date Taking? Authorizing Provider  benzoyl peroxide 10 % gel Apply topically daily. Reported on 04/25/2016    Historical Provider, MD  cetirizine (ZYRTEC) 10 MG tablet Take 10 mg by mouth daily.    Historical Provider, MD  desogestrel-ethinyl estradiol (APRI,EMOQUETTE,SOLIA) 0.15-30 MG-MCG tablet Take 1 tablet by mouth daily.    Historical  Provider, MD  DULoxetine (CYMBALTA) 20 MG capsule Take 2 capsules daily 05/22/16   Collene Gobble, MD  valACYclovir (VALTREX) 1000 MG tablet TAKE 2 TABLETS TWICE A DAY FOR 2 DOSES FOR COLD SORES Patient not taking: Reported on 04/25/2016 11/22/15   Collene Gobble, MD  valACYclovir (VALTREX) 500 MG tablet Take 1 tablet (500 mg total) by mouth daily. 02/21/16   Collene Gobble, MD    Family History Family History  Problem Relation Age of Onset  . Depression Mother   .  Hypertension Mother   . Mental illness Mother     Social History Social History  Substance Use Topics  . Smoking status: Former Smoker    Types: Cigarettes    Quit date: 03/10/2008  . Smokeless tobacco: Never Used     Comment: SOCIAL SMOKER IN COLLEGE FOR 77YRS -- NO SMOKING SINCE  . Alcohol use Yes     Comment: OCCASIONAL      Allergies   Amoxicillin; Tdap [diphth-acell pertussis-tetanus]; Hydrocodeine [dihydrocodeine]; and Penicillins   Review of Systems Review of Systems  All other systems reviewed and are negative.    Physical Exam Triage Vital Signs ED Triage Vitals  Enc Vitals Group     BP 08/17/16 0917 120/81     Pulse Rate 08/17/16 0917 92     Resp --      Temp 08/17/16 0917 98.2 F (36.8 C)     Temp Source 08/17/16 0917 Oral     SpO2 08/17/16 0917 99 %     Weight 08/17/16 0917 170 lb (77.1 kg)     Height --      Head Circumference --      Peak Flow --      Pain Score 08/17/16 0918 3     Pain Loc --      Pain Edu? --      Excl. in GC? --    No data found.   Updated Vital Signs BP 120/81 (BP Location: Left Arm)   Pulse 92   Temp 98.2 F (36.8 C) (Oral)   Wt 170 lb (77.1 kg)   LMP 07/23/2016   SpO2 99%   BMI 28.29 kg/m   Visual Acuity Right Eye Distance:   Left Eye Distance:   Bilateral Distance:    Right Eye Near:   Left Eye Near:    Bilateral Near:     Physical Exam  Constitutional: She is oriented to person, place, and time. She appears well-developed and well-nourished. No distress.  HENT:  Head: Normocephalic and atraumatic.  Eyes: Conjunctivae and EOM are normal. Pupils are equal, round, and reactive to light. No scleral icterus.  Neck: Normal range of motion.  Cardiovascular: Normal rate.   Pulmonary/Chest: Effort normal.  Abdominal: She exhibits no distension.  Musculoskeletal:       Right ankle: Tenderness. Lateral malleolus and AITFL tenderness found. No medial malleolus, no posterior TFL, no head of 5th metatarsal and no  proximal fibula tenderness found. Achilles tendon exhibits normal Thompson's test results.       Feet:  Right ankle and foot: Neurovascular distally intact.  Neurological: She is alert and oriented to person, place, and time.  Skin: Skin is warm.  Psychiatric: She has a normal mood and affect.  Nursing note and vitals reviewed.    UC Treatments / Results  Labs (all labs ordered are listed, but only abnormal results are displayed) Labs Reviewed - No data to display  EKG  EKG Interpretation  None       Radiology No results found.   X-ray  Report 08/11/2016 RIGHT ANKLE - COMPLETE 3+ VIEW COMPARISON:  06/29/2016 FINDINGS: Soft tissue swelling laterally. Osseous mineralization normal. Joint spaces preserved. No acute fracture, dislocation, or bone destruction.  IMPRESSION: No acute osseous abnormalities.  Electronically Signed   By: Ulyses Southward M.D.   On: 08/11/2016 17:11  Procedures Procedures (including critical care time)  Medications Ordered in UC Medications - No data to display   Initial Impression / Assessment and Plan / UC Course  I have reviewed the triage vital signs and the nursing notes.  Pertinent labs & imaging results that were available during my care of the patient were reviewed by me and considered in my medical decision making (see chart for details).  Clinical Course   As she reports that the Aircast was not working that well, we fitted her with right ankle ASO brace today.   Final Clinical Impressions(s) / UC Diagnoses   Final diagnoses:  Right ankle sprain, subsequent encounter  had a very severe right ankle sprain 06/29/2016, with no fracture seen on x-ray and 06/29/2016 and and another severe right ankle sprain 08/11/2016 08/10/2016 (no fx on xray 08/10/16)   An After Visit Summary was printed and given to the patient. Included in AVS were these instructions:  "You had a very severe right ankle sprain 06/29/2016, with no fracture  seen on x-ray and 06/29/2016 and 08/11/2016.You are improving but not completely better.Continue with ankle exercises per printed out instructions you already have. Wear Ankle brace.  Work restrictions: No excessive walking or running or climbing for now, but may do other work activities. Follow-up appointment: [facility] Monday 08/20/16 11:30 AM. Will plan on referring to sports medicine/orthopedist if not better."  New Prescriptions-NONE New Prescriptions   No medications on file     Lajean Manes, MD 08/17/16 1017

## 2016-08-22 ENCOUNTER — Encounter: Payer: Self-pay | Admitting: Emergency Medicine

## 2016-08-24 NOTE — Telephone Encounter (Signed)
Please advise weaning schedule for pt

## 2016-08-31 DIAGNOSIS — H47293 Other optic atrophy, bilateral: Secondary | ICD-10-CM | POA: Diagnosis not present

## 2016-08-31 DIAGNOSIS — H21542 Posterior synechiae (iris), left eye: Secondary | ICD-10-CM | POA: Diagnosis not present

## 2016-09-12 DIAGNOSIS — H21542 Posterior synechiae (iris), left eye: Secondary | ICD-10-CM | POA: Diagnosis not present

## 2016-10-09 ENCOUNTER — Telehealth: Payer: Self-pay

## 2016-10-09 NOTE — Telephone Encounter (Signed)
Pre visit call completed 

## 2016-10-10 ENCOUNTER — Ambulatory Visit (INDEPENDENT_AMBULATORY_CARE_PROVIDER_SITE_OTHER): Payer: BLUE CROSS/BLUE SHIELD | Admitting: Family Medicine

## 2016-10-10 ENCOUNTER — Encounter: Payer: Self-pay | Admitting: Family Medicine

## 2016-10-10 VITALS — BP 112/74 | HR 91 | Temp 97.4°F | Ht 65.0 in | Wt 176.0 lb

## 2016-10-10 DIAGNOSIS — R635 Abnormal weight gain: Secondary | ICD-10-CM

## 2016-10-10 DIAGNOSIS — F3341 Major depressive disorder, recurrent, in partial remission: Secondary | ICD-10-CM

## 2016-10-10 NOTE — Progress Notes (Signed)
Pre visit review using our clinic review tool, if applicable. No additional management support is needed unless otherwise documented below in the visit note. 

## 2016-10-10 NOTE — Progress Notes (Signed)
[facility] [address], [address] [place], [place] [address] [phone] [phone]  Date:  10/10/2016   Name:  Patricia Morse   DOB:  10/18/1986   MRN:  [id number]  PCP:  Lucilla EdinAUB, STEVE A, MD    Chief Complaint: Establish Care (Pt here to est care. Would like to discuss depression medication and weight gain. Pt states that she can not have the flu vaccine. )   History of Present Illness:  Patricia Morse is a 30 y.o. very pleasant female patient who presents with the following:  Here today as a new patient to establish care.   [facility] for a [facility]. She arrived more than 10 min past her appt time for this new patient visit and was advised that her visit time may be abbreviated today.   She is on cymbalta, OCP.   She is hard of hearing and has a left cochlear implant.    She notes that she has gained 10- 15 lbs since she increased her cymbalta dose.  She wonders if she could lower her dose or stop this medication.   She has been on cymbalta since about 2012.  She tried effexor first but this gave her migraine HA.  The cymbalta has worked well for her overall- "I still have some bad days," but overall she feels like her mood is stable and ok  She notes that she has been on antidepressants since she graduated from college and wonders if it might be time for her to come off of these.  She would like to see if she is able to be ok without medication if possible  She cannot used wellbutrin due to possible seizure risk from encephalitis as a young child. This is what caused her hearing problems, balance issues.   She feels like her mood is "mostly fine, when something happens I get depressed but I think that is normal."  She relates that she got depressed when she lost her job last year but does not think that is unusual  She is in a serious relationship with a female partner right now- this relationship is going well  and they plan to move in together. She has a job which is "not a great job, but it's a job."   She denies any suicidal thoughts or risk of self harm.  She did have suicidal ideation in college but relates that these were actually dreams about something bad happening to her and not actually thoughts about killing herself She also tried zoloft in the past.  She does not really remember how this worked for her  She was 135 lbs about 5 years ago- she has worked to [employer] her weight but seems to keep gaining weight.  She notes that as a younger person she was able to eat "pretty much anything I wanted" but notes that this is not the case any more.  Admits that she does not eat very well and does not exercise She does not have hirsutism- does not suspect PCOS   Patient Active Problem List   Diagnosis Date Noted  . Visual disturbance 04/25/2016  . Dizziness 04/25/2016  . Obesity, unspecified 06/08/2013  . Depression 10/10/2012  . Hearing loss 10/10/2012    Past Medical History:  Diagnosis Date  . Allergy   . Auditory neuropathy BILATERAL  . Bilateral sensorineural hearing loss    SECONDARY AUDITORY NEUROPATHY  .  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 Depression   . 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 Internal derangement of right knee   . PONV (postoperative nausea and vomiting) POSTOP COCHLEAR  IMPLANT 2006  . Seasonal allergies     Past Surgical History:  Procedure Laterality Date  . BILATERAL MIDDLE EAR EXPLORATION/ SEALING OF OVAL AND ROUND WINDOW FISTULAS  1999 (AGE 69)   PERILYMPH FISTULAS  . CHONDROPLASTY  03/13/2012   Procedure: CHONDROPLASTY;  Surgeon: Shelda Pal, MD;  Location: [facility];  Service: Orthopedics;  Laterality: Right;  . COCHLEAR IMPLANT  11-15-2005  (AGE 5)   LEFT EAR  (CLARION HIGH-RESOLUTION 90K MULTICHANNEL,  1-J ELECTRODE)  . KNEE ARTHROSCOPY  03/13/2012   Procedure: ARTHROSCOPY  KNEE;  Surgeon: Shelda Pal, MD;  Location: [facility];  Service: Orthopedics;  Laterality: Right;  RIGHT KNEE DIAGNOSTIC AND OPERATIVE SCOPE  . WISDOM TOOTH EXTRACTION  AGE 52   DENTAL OFFICE    Social History  Substance Use Topics  . Smoking status: Former Smoker    Types: Cigarettes    Quit date: 03/10/2008  . Smokeless tobacco: Never Used     Comment: SOCIAL SMOKER IN COLLEGE FOR 27YRS -- NO SMOKING SINCE  . Alcohol use Yes     Comment: OCCASIONAL     Family History  Problem Relation Age of Onset  . Depression Mother   . Hypertension Mother   . Mental illness Mother     Allergies  Allergen Reactions  . Amoxicillin Hives  . Tdap [Diphth-Acell Pertussis-Tetanus]   . Hydrocodeine [Dihydrocodeine] Rash  . Penicillins Rash    Medication list has been reviewed and updated.  Current Outpatient Prescriptions on File Prior to Visit  Medication Sig Dispense Refill  . benzoyl peroxide 10 % gel Apply topically daily. Reported on 04/25/2016    . cetirizine (ZYRTEC) 10 MG tablet Take 10 mg by mouth daily.    Marland Kitchen desogestrel-ethinyl estradiol (APRI,EMOQUETTE,SOLIA) 0.15-30 MG-MCG tablet Take 1 tablet by mouth daily.    . DULoxetine (CYMBALTA) 20 MG capsule Take 2 capsules daily 60 capsule 11  . Probiotic Product (PROBIOTIC DAILY PO) Take by mouth.    . thiamine (VITAMIN B-1) 100 MG tablet Take 100 mg by mouth daily.    . valACYclovir (VALTREX) 1000 MG tablet TAKE 2 TABLETS TWICE A DAY FOR 2 DOSES FOR COLD SORES 30 tablet 7  . valACYclovir (VALTREX) 500 MG tablet Take 1 tablet (500 mg total) by mouth daily. 30 tablet 11   No current facility-administered medications on file prior to visit.     Review of Systems:  As per HPI- otherwise negative.   Physical Examination: Vitals:   10/10/16 1337  BP: 112/74  Pulse: 91  Temp: 97.4 F (36.3 C)   Vitals:   10/10/16 1337  Weight: 176 lb (79.8 kg)  Height: 5\' 5"  (1.651 m)   Body mass index is 29.29  kg/m. Ideal Body Weight: Weight in (lb) to have BMI = 25: 149.9  GEN: WDWN, NAD, Non-toxic, A & O x 3, looks well, overweight HEENT: Atraumatic, Normocephalic. Neck supple. No masses, No LAD. Ears and Nose: No external deformity. CV: RRR, No M/G/R. No JVD. No thrill. No extra heart sounds. PULM: CTA B, no wheezes, crackles, rhonchi. No retractions. No resp. distress. No accessory muscle use. ABD: S, NT, ND EXTR: No c/c/e NEURO Normal gait.  PSYCH: Normally interactive. Conversant. Not depressed or anxious appearing.  Calm demeanor.    Assessment and Plan: Weight gain - Plan: TSH  Recurrent major depressive disorder, in partial remission (HCC)  She would like to try and taper off cymbalta- we will decrease to 20 mg and then taper off/  If she needs an antidepressant again may try a weight neutral SSRi such as prozac Discussed weight gain in young adulthood and encouraged her to work on her diet and exercise routines.  She plan to do so Check TSH Will plan further follow- up pending labs.   Signed Abbe AmsterdamJessica Stuart Guillen, MD

## 2016-10-10 NOTE — Patient Instructions (Signed)
We are going to have you slowly taper off cymbalta- take 20 mg for 7- 10 days, then take it every other day for 7- 10 days, then every 3rd day until you finish your supply  If you notice depression symptoms coming back please let me know- let's plan to visit in about 3 months to see how you are doing  We will also check your TSH today to look for any thyroid problem.   Some weight gain can occur with the transition from being a "skinny kid" to being an adult woman.   Certainly we will look for any other cause of your weight gain, but working on a better diet and regular exercise will also help you in the future.

## 2016-10-11 LAB — TSH: TSH: 1.17 u[IU]/mL (ref 0.35–4.50)

## 2016-10-22 ENCOUNTER — Encounter: Payer: Self-pay | Admitting: Family Medicine

## 2016-11-16 ENCOUNTER — Encounter: Payer: Self-pay | Admitting: Family Medicine

## 2016-11-16 ENCOUNTER — Encounter: Payer: Self-pay | Admitting: Physician Assistant

## 2016-11-16 ENCOUNTER — Ambulatory Visit (INDEPENDENT_AMBULATORY_CARE_PROVIDER_SITE_OTHER): Payer: BLUE CROSS/BLUE SHIELD | Admitting: Physician Assistant

## 2016-11-16 VITALS — BP 122/80 | HR 98 | Temp 98.5°F | Resp 18 | Ht 65.0 in | Wt 179.0 lb

## 2016-11-16 DIAGNOSIS — J0110 Acute frontal sinusitis, unspecified: Secondary | ICD-10-CM

## 2016-11-16 DIAGNOSIS — E669 Obesity, unspecified: Secondary | ICD-10-CM

## 2016-11-16 MED ORDER — DOXYCYCLINE HYCLATE 100 MG PO TABS: 100.0000 mg | ORAL_TABLET | Freq: Two times a day (BID) | ORAL | 0 refills | Status: DC

## 2016-11-16 MED ORDER — IPRATROPIUM BROMIDE 0.06 % NA SOLN: 2.0000 | Freq: Three times a day (TID) | NASAL | 0 refills | Status: DC

## 2016-11-16 NOTE — Patient Instructions (Addendum)
  Please push fluids.  Tylenol and Motrin for fever and body aches.    A humidifier can help especially when the air is dry -if you do not have a humidifier you can boil a pot of water on the stove in your home to help with the dry air.  Nasal saline spray can be helpful to keep the mucus membranes moist and thin the nasal mucus    IF you received an x-ray today, you will receive an invoice from [facility]. Please contact  Radiology at [phone] with questions or concerns regarding your invoice.   IF you received labwork today, you will receive an invoice from [facility]. Please contact [facility] at [phone] with questions or concerns regarding your invoice.   Our billing staff will not be able to assist you with questions regarding bills from these companies.  You will be contacted with the lab results as soon as they are available. The fastest way to get your results is to activate your My Chart account. Instructions are located on the last page of this paperwork. If you have not heard from us regarding the results in 2 weeks, please contact this office.      

## 2016-11-16 NOTE — Progress Notes (Signed)
Patricia Morse Patricia Morse  MRN: [id number] DOB: [birth year]  Subjective:  Pt presents to clinic with cold symptoms that started about 12 days ago.  Started with cough and chest congestion and then some nasal drainage - then she thought she was getting better until 4 days ago when her sinus symptoms started getting worse.  She has sinus pain and pressure that is worse when she moves her head and she has had a headache for 3 days that is somewhat relieved but tylenol and motrin.     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  HENT: Positive for congestion, postnasal drip, rhinorrhea (green) and sinus pain. Negative for sore throat.   Respiratory: Positive for cough (improved).        No h/o asthma, nonsmoker  Gastrointestinal: Negative.   Neurological: Positive for dizziness and headaches.    Patient Active Problem List   Diagnosis Date Noted  . Visual disturbance 04/25/2016  . Dizziness 04/25/2016  . Obesity, unspecified 06/08/2013  . Depression 10/10/2012  . Hearing loss 10/10/2012    Current Outpatient Prescriptions on File Prior to Visit  Medication Sig Dispense Refill  . cetirizine (ZYRTEC) 10 MG tablet Take 10 mg by mouth daily.    Marland Kitchen. desogestrel-ethinyl estradiol (APRI,EMOQUETTE,SOLIA) 0.15-30 MG-MCG tablet Take 1 tablet by mouth daily.    . DULoxetine (CYMBALTA) 20 MG capsule Take 2 capsules daily (Patient taking differently: 40 mg. Take 2 capsules daily) 60 capsule 11  . Probiotic Product (PROBIOTIC DAILY PO) Take by mouth.    . thiamine (VITAMIN B-1) 100 MG tablet Take 100 mg by mouth daily.    . benzoyl peroxide 10 % gel Apply topically daily. Reported on 04/25/2016    . valACYclovir (VALTREX) 1000 MG tablet TAKE 2 TABLETS TWICE A DAY FOR 2 DOSES FOR COLD SORES (Patient not taking: Reported on 11/16/2016) 30 tablet 7   No  current facility-administered medications on file prior to visit.     Allergies  Allergen Reactions  . Amoxicillin Hives  . Tdap [Diphth-Acell Pertussis-Tetanus]   . Hydrocodeine [Dihydrocodeine] Rash  . Penicillins Rash    Pt patients past, family and social history were reviewed and updated.   Objective:  BP 122/80 (BP Location: Right Arm, Patient Position: Sitting, Cuff Size: Large)   Pulse 98   Temp 98.5 F (36.9 C) (Oral)   Resp 18   Ht 5\' 5"  (1.651 m)   Wt 179 lb (81.2 kg)   LMP 11/13/2016   SpO2 100%   BMI 29.79 kg/m   Physical Exam  Constitutional: She is oriented to person, place, and time and well-developed, well-nourished, and in no distress.  HENT:  Head: Normocephalic and atraumatic.  Right Ear: Hearing, tympanic membrane, external ear and ear canal normal.  Left Ear: Hearing, tympanic membrane, external ear and ear canal normal.  Nose: Mucosal edema (red and swollen) present.  Mouth/Throat: Uvula is midline, oropharynx is clear and moist and mucous membranes are normal.  Eyes: Conjunctivae are normal.  Neck: Normal range of motion.  Cardiovascular: Normal rate, regular rhythm and normal heart sounds.   No murmur heard. Pulmonary/Chest: Effort normal and breath sounds normal.  Neurological: She is alert and oriented to person, place, and time. Gait normal.  Skin: Skin is warm and dry.  Psychiatric: Mood, memory, affect and judgment normal.  Vitals reviewed.  Assessment and Plan :  Acute non-recurrent frontal sinusitis - Plan: doxycycline (VIBRA-TABS) 100 MG tablet, ipratropium (ATROVENT) 0.06 % nasal spray  Symptomatic care in addition to the above treat with increase in fluids.  Benny LennertSarah Bao Bazen PA-C  [facility] 11/16/2016 4:01 PM

## 2016-11-21 ENCOUNTER — Other Ambulatory Visit: Payer: Self-pay | Admitting: Family Medicine

## 2016-11-21 DIAGNOSIS — E669 Obesity, unspecified: Secondary | ICD-10-CM

## 2016-11-23 ENCOUNTER — Telehealth: Payer: Self-pay

## 2016-11-23 ENCOUNTER — Encounter: Payer: Self-pay | Admitting: Physician Assistant

## 2016-11-23 MED ORDER — FLUCONAZOLE 150 MG PO TABS: 150.0000 mg | ORAL_TABLET | Freq: Once | ORAL | 0 refills | Status: AC

## 2016-11-23 NOTE — Telephone Encounter (Signed)
My chart message-pt given ABX and needs diflucan.   Discussed with [place]any Wiseman and she approved ordering for pt.   Sent to [facility].

## 2016-12-03 ENCOUNTER — Encounter: Payer: Self-pay | Admitting: Family Medicine

## 2016-12-03 DIAGNOSIS — F329 Major depressive disorder, single episode, unspecified: Secondary | ICD-10-CM

## 2016-12-03 DIAGNOSIS — F32A Depression, unspecified: Secondary | ICD-10-CM

## 2016-12-12 MED ORDER — DULOXETINE HCL 20 MG PO CPEP: ORAL_CAPSULE | ORAL | 3 refills | Status: DC

## 2016-12-12 NOTE — Addendum Note (Signed)
Addended by: Abbe AmsterdamOPLAND, JESSICA C on: 12/12/2016 07:45 PM   Modules accepted: Orders

## 2016-12-13 MED ORDER — DULOXETINE HCL 30 MG PO CPEP: 30.0000 mg | ORAL_CAPSULE | Freq: Every day | ORAL | 1 refills | Status: DC

## 2016-12-13 NOTE — Addendum Note (Signed)
Addended by: Abbe AmsterdamOPLAND, Luara Faye C on: 12/13/2016 05:09 PM   Modules accepted: Orders

## 2016-12-22 ENCOUNTER — Encounter: Payer: Self-pay | Admitting: Family Medicine

## 2016-12-24 ENCOUNTER — Encounter: Payer: Self-pay | Admitting: Family Medicine

## 2016-12-24 DIAGNOSIS — H903 Sensorineural hearing loss, bilateral: Secondary | ICD-10-CM | POA: Diagnosis not present

## 2016-12-26 DIAGNOSIS — H933X3 Disorders of bilateral acoustic nerves: Secondary | ICD-10-CM | POA: Diagnosis not present

## 2016-12-26 DIAGNOSIS — H903 Sensorineural hearing loss, bilateral: Secondary | ICD-10-CM | POA: Diagnosis not present

## 2016-12-30 DIAGNOSIS — R05 Cough: Secondary | ICD-10-CM | POA: Diagnosis not present

## 2016-12-30 DIAGNOSIS — Z20828 Contact with and (suspected) exposure to other viral communicable diseases: Secondary | ICD-10-CM | POA: Diagnosis not present

## 2016-12-31 DIAGNOSIS — E669 Obesity, unspecified: Secondary | ICD-10-CM | POA: Diagnosis not present

## 2017-01-04 DIAGNOSIS — H53453 Other localized visual field defect, bilateral: Secondary | ICD-10-CM | POA: Diagnosis not present

## 2017-01-14 ENCOUNTER — Encounter: Payer: Self-pay | Admitting: Family Medicine

## 2017-01-14 DIAGNOSIS — Z01419 Encounter for gynecological examination (general) (routine) without abnormal findings: Secondary | ICD-10-CM | POA: Diagnosis not present

## 2017-01-14 DIAGNOSIS — L298 Other pruritus: Secondary | ICD-10-CM | POA: Diagnosis not present

## 2017-01-14 DIAGNOSIS — Z3041 Encounter for surveillance of contraceptive pills: Secondary | ICD-10-CM | POA: Diagnosis not present

## 2017-01-14 MED ORDER — VALACYCLOVIR HCL 1 G PO TABS: ORAL_TABLET | ORAL | 7 refills | Status: DC

## 2017-01-23 DIAGNOSIS — H903 Sensorineural hearing loss, bilateral: Secondary | ICD-10-CM | POA: Diagnosis not present

## 2017-01-23 DIAGNOSIS — H933X3 Disorders of bilateral acoustic nerves: Secondary | ICD-10-CM | POA: Diagnosis not present

## 2017-02-07 DIAGNOSIS — B373 Candidiasis of vulva and vagina: Secondary | ICD-10-CM | POA: Diagnosis not present

## 2017-02-07 DIAGNOSIS — N760 Acute vaginitis: Secondary | ICD-10-CM | POA: Diagnosis not present

## 2017-02-25 ENCOUNTER — Encounter: Payer: Self-pay | Admitting: Family Medicine

## 2017-03-07 DIAGNOSIS — E669 Obesity, unspecified: Secondary | ICD-10-CM | POA: Diagnosis not present

## 2017-03-12 NOTE — Progress Notes (Signed)
[facility] [address] [place], [place] [address] [phone]  Date:  03/13/2017   Name:  Patricia Morse   DOB:  04/08/1986   MRN:  [id number]  PCP:  Abbe Amsterdam, MD    Chief Complaint: Follow-up (Pt here for weight check up. Pt states that she is overdue for tetanus. )   History of Present Illness:  Patricia Morse is a 31 y.o. very pleasant female patient who presents with the following:  Last seen by myself in November to establish care.   She has sent me several email messages in the meantime about her depression meds and difficulty with weight loss.   Here today for a follow-up visit for these concerns  She has been working on her weight- she is tracking her food intake and has lost a couple of lbs which is great news.  She has done some research into weight gain and does not think that she has any sign of other diseases that may cause weight gain She has also started seeing a nutrionist and is exercising as much as she can  She has been on OCP for about 10 years and these do control her menses We have followed her thyroid - last TSH in November, normal Lab Results  Component Value Date   TSH 1.17 10/10/2016   Her mother does have hashimoto's thyroiditis  She is in a happy relationship and notes that "that can cause weight gain."   She is taking 20 mg of cymbalta now- she feels like she is ok on this dose for now.  She is seeing a psychiatrist next week- this will be her first visit.    She had a Tdap at age 12 per her recollection. She would like to boost her tetanus protection today.  She did not have an allergic reaction to the Tdap shot- she did have flu like sx for a day or so following the shot but never had any hives swelling or other evidence of true allergy  She would like to update her Td today as she is overdue  Wt Readings from Last 3 Encounters:  03/13/17 180 lb 6.4 oz (81.8 kg)   11/16/16 179 lb (81.2 kg)  10/10/16 176 lb (79.8 kg)    Patient Active Problem List   Diagnosis Date Noted  . Visual disturbance 04/25/2016  . Dizziness 04/25/2016  . Obesity, unspecified 06/08/2013  . Depression 10/10/2012  . Hearing loss 10/10/2012    Past Medical History:  Diagnosis Date  . Allergy   . Auditory neuropathy BILATERAL  . Bilateral sensorineural hearing loss    SECONDARY AUDITORY NEUROPATHY  . 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 Depression   . 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 Internal derangement of right knee   . PONV (postoperative nausea and vomiting) POSTOP COCHLEAR  IMPLANT 2006  . Seasonal allergies     Past Surgical History:  Procedure Laterality Date  . BILATERAL MIDDLE EAR EXPLORATION/ SEALING OF OVAL AND ROUND WINDOW FISTULAS  1999 (AGE 22)   PERILYMPH FISTULAS  . CHONDROPLASTY  03/13/2012   Procedure: CHONDROPLASTY;  Surgeon: Shelda Pal, MD;  Location: [facility];  Service: Orthopedics;  Laterality: Right;  . COCHLEAR IMPLANT  11-15-2005  (AGE 38)   LEFT EAR  (CLARION HIGH-RESOLUTION 90K MULTICHANNEL,  1-J ELECTRODE)  . KNEE ARTHROSCOPY  03/13/2012   Procedure: ARTHROSCOPY KNEE;  Surgeon: Shelda Pal, MD;  Location: [facility];  Service: Orthopedics;  Laterality: Right;  RIGHT KNEE DIAGNOSTIC AND OPERATIVE SCOPE  . WISDOM TOOTH EXTRACTION  AGE 41   DENTAL OFFICE    Social History  Substance Use Topics  . Smoking status: Former Smoker    Types: Cigarettes    Quit date: 03/10/2008  . Smokeless tobacco: Never Used     Comment: SOCIAL SMOKER IN COLLEGE FOR 77YRS -- NO SMOKING SINCE  . Alcohol use Yes     Comment: OCCASIONAL     Family History  Problem Relation Age of Onset  . Depression Mother   . Hypertension Mother   . Mental illness Mother     Allergies  Allergen Reactions  . Amoxicillin Hives  . Tdap [Diphth-Acell Pertussis-Tetanus]    . Hydrocodeine [Dihydrocodeine] Rash  . Penicillins Rash    Medication list has been reviewed and updated.  Current Outpatient Prescriptions on File Prior to Visit  Medication Sig Dispense Refill  . DULoxetine (CYMBALTA) 20 MG capsule Take 1 capsule daily 30 capsule 3  . Probiotic Product (PROBIOTIC DAILY PO) Take by mouth.    . thiamine (VITAMIN B-1) 100 MG tablet Take 100 mg by mouth daily.    . valACYclovir (VALTREX) 1000 MG tablet TAKE 2 TABLETS TWICE A DAY FOR 2 DOSES FOR COLD SORES 30 tablet 7   No current facility-administered medications on file prior to visit.     Review of Systems:  As per HPI- otherwise negative.   Physical Examination: Vitals:   03/13/17 1138  BP: 107/84  Pulse: 96   Vitals:   03/13/17 1138  Weight: 180 lb 6.4 oz (81.8 kg)  Height:  (1.651 m)   Body mass index is 30.02 kg/m. Ideal Body Weight: Weight in (lb) to have BMI = 25: 149.9  GEN: WDWN, NAD, Non-toxic, A & O x 3, overweight, otherwise looks well HEENT: Atraumatic, Normocephalic. Neck supple. No masses, No LAD.  Left cochlear implant Ears and Nose: No external deformity. CV: RRR, No M/G/R. No JVD. No thrill. No extra heart sounds. PULM: CTA B, no wheezes, crackles, rhonchi. No retractions. No resp. distress. No accessory muscle use. ABD: S, NT, ND, +BS. No rebound. No HSM. EXTR: No c/c/e NEURO Normal gait.  PSYCH: Normally interactive. Conversant. Not depressed or anxious appearing.  Calm demeanor.    Assessment and Plan: Overweight  Immunization due - Plan: Td vaccine greater than or equal to 7yo preservative free IM updated her tetanus today- plain Td given.  Pt will alert me if any adverse effects.  She had some flu like sx after her Tdap but no allergic reaction Discussed her weight- offered to start her on phentermine if she would like.  For now she prefers to continue her current efforts but she will let me know if this changes    Signed Abbe Amsterdam, MD

## 2017-03-13 ENCOUNTER — Telehealth: Payer: Self-pay | Admitting: Family Medicine

## 2017-03-13 ENCOUNTER — Ambulatory Visit (INDEPENDENT_AMBULATORY_CARE_PROVIDER_SITE_OTHER): Payer: BLUE CROSS/BLUE SHIELD | Admitting: Family Medicine

## 2017-03-13 VITALS — BP 107/84 | HR 96 | Ht 65.0 in | Wt 180.4 lb

## 2017-03-13 DIAGNOSIS — E663 Overweight: Secondary | ICD-10-CM

## 2017-03-13 DIAGNOSIS — Z23 Encounter for immunization: Secondary | ICD-10-CM | POA: Diagnosis not present

## 2017-03-13 NOTE — Telephone Encounter (Signed)
Documented in historical immunizations.

## 2017-03-13 NOTE — Telephone Encounter (Signed)
Caller name: Pearletha Furl from [facility]  Relation to pt: Medical Records  Call back number: [phone]   Reason for call: Pearletha Furl returning call with the following information patient last tdap 06/2006

## 2017-03-13 NOTE — Patient Instructions (Addendum)
It was good to see you today- best of luck with your weight loss efforts!  Keep working on this - if you decide you would like to try a medication like phentermine over the next few months let me know.   You got your tetanus booster today- perhaps take a dose of ibuprofen today to help head off any aches and flu like symptoms

## 2017-03-14 ENCOUNTER — Encounter: Payer: Self-pay | Admitting: Family Medicine

## 2017-03-20 ENCOUNTER — Ambulatory Visit (INDEPENDENT_AMBULATORY_CARE_PROVIDER_SITE_OTHER): Payer: BLUE CROSS/BLUE SHIELD | Admitting: Psychiatry

## 2017-03-20 ENCOUNTER — Encounter (HOSPITAL_COMMUNITY): Payer: Self-pay | Admitting: Psychiatry

## 2017-03-20 VITALS — BP 130/82 | HR 88 | Ht 65.0 in | Wt 181.0 lb

## 2017-03-20 DIAGNOSIS — Z79899 Other long term (current) drug therapy: Secondary | ICD-10-CM

## 2017-03-20 DIAGNOSIS — Z87891 Personal history of nicotine dependence: Secondary | ICD-10-CM

## 2017-03-20 DIAGNOSIS — F331 Major depressive disorder, recurrent, moderate: Secondary | ICD-10-CM

## 2017-03-20 DIAGNOSIS — Z818 Family history of other mental and behavioral disorders: Secondary | ICD-10-CM | POA: Diagnosis not present

## 2017-03-20 MED ORDER — VORTIOXETINE HBR 10 MG PO TABS: 10.0000 mg | ORAL_TABLET | Freq: Every day | ORAL | 1 refills | Status: DC

## 2017-03-20 NOTE — Patient Instructions (Signed)
Take Trintellix 5 mg every morning for 1 week (use the sample pack I provided)  Then, start Trintellix 10 mg every morning (pick up the 10 mg tablets - take 1 tablet)   Come back in 6 weeks

## 2017-03-20 NOTE — Progress Notes (Signed)
Psychiatric Initial Adult Assessment   Patient Identification: Arantza Darrington MRN:  [id number] Date of Evaluation:  03/20/2017 Referral Source: self Chief Complaint:  depression Visit Diagnosis:    ICD-9-CM ICD-10-CM   1. Moderate episode of recurrent major depressive disorder (HCC) 296.32 F33.1 vortioxetine HBr (TRINTELLIX) 10 MG TABS   History of Present Illness:  Karington Zarazua is a 31 year old female with a psychiatric history of major depressive disorder. She presents today to establish psychiatric care, and to consider alternative medications. She reports that she has been tried on Cymbalta, Effexor, Zoloft, Lexapro, and Celexa. Her main concerns have been side effects, specifically sexual dysfunction and weight gain. She is currently on Cymbalta 20 mg daily, but feels like this is not enough of the dose to help with her depression, but when she goes up to 40 mg, she has more weight gain.   She reports that she continues to struggle with anxiety and worry symptoms, some depression, some difficulty with self-esteem, and specifically she has gained about 30-40 pounds over the past 6-8 months. 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 When her mood is worse, she has energy problems, sleeps more, eats less, and has less interest in doing activities.  When she has tried to come off Cymbalta completely, she has had a increase in the symptoms.  She wonders about an alternative medication or SSRI that would have less side effects. I educated the patient about Trintellix, and specifically the risks and benefits associated with the medication. We discussed the differences between SSRI and Cymbalta, and I educated her that Trintellix has some additional pharmacologic properties to help with energy and reducing side effect profile.  He also discussed Wellbutrin, and the patient brings up a couple concerns about this. 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 She had seizures as a child during this time. When she was around age 28. She has never had any seizures in adulthood her teenage years. She is not on any antiepileptic medications. She does struggle with vertigo, due to inner ear issues.   She has struggled with headaches in the past due to Effexor. Because of this issues, I suggested we start with Trintellix.  Ultimately, given that the patient does not have a history of seizure disorder, but her seizures were in the context of an acute medical illness in childhood, she would be an acceptable candidate to try Wellbutrin if we do not have remission and good tolerability with Trintellix.  The patient's mother was on Wellbutrin with good results.   She denies any suicidality or self-injurious behaviors. She denies any history of purging behaviors or bulimia. She is trying to lose approximately 1 pound per week for a healthy weight loss to get back to her goal of around 130-140 pounds.  She is agreeable to set up a therapy intake here, so that her mental health care can be under one roof.  Associated Signs/Symptoms: Depression Symptoms:  depressed mood, anhedonia, feelings of worthlessness/guilt, difficulty concentrating, anxiety, weight gain, (Hypo) Manic Symptoms:  none Anxiety Symptoms:  Excessive Worry, Social Anxiety, Psychotic Symptoms:  none PTSD Symptoms: Negative  Past Psychiatric History: no hospitalizations. No prior psychiatric care. Had counseling in college at [employer]  Previous Psychotropic Medications: Yes   Substance Abuse History in the last 12 months:  No.  Consequences of Substance Abuse: Negative  Past Medical History:  Past Medical  History:  Diagnosis Date  . Allergy   . Auditory neuropathy BILATERAL  . Bilateral sensorineural hearing loss    SECONDARY AUDITORY NEUROPATHY  . 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 Depression   . 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 Internal derangement of right knee   . PONV (postoperative nausea and vomiting) POSTOP COCHLEAR  IMPLANT 2006  . Seasonal allergies     Past Surgical History:  Procedure Laterality Date  . BILATERAL MIDDLE EAR EXPLORATION/ SEALING OF OVAL AND ROUND WINDOW FISTULAS  1999 (AGE 39)   PERILYMPH FISTULAS  . CHONDROPLASTY  03/13/2012   Procedure: CHONDROPLASTY;  Surgeon: Shelda Pal, MD;  Location: [facility];  Service: Orthopedics;  Laterality: Right;  . COCHLEAR IMPLANT  11-15-2005  (AGE 33)   LEFT EAR  (CLARION HIGH-RESOLUTION 90K MULTICHANNEL,  1-J ELECTRODE)  . KNEE ARTHROSCOPY  03/13/2012   Procedure: ARTHROSCOPY KNEE;  Surgeon: Shelda Pal, MD;  Location: [facility];  Service: Orthopedics;  Laterality: Right;  RIGHT KNEE DIAGNOSTIC AND OPERATIVE SCOPE  . WISDOM TOOTH EXTRACTION  AGE 25   DENTAL OFFICE    Family Psychiatric History: see below  Family History:  Family History  Problem Relation Age of Onset  . Depression Mother   . Hypertension Mother   . Mental illness Mother     Social History:   Social History   Social History  . Marital status: Single    Spouse name: N/A  . Number of children: N/A  . Years of education: N/A   Occupational History  . Claims Professional for [employer]   Social History Main Topics  . Smoking status: Former Smoker    Types: Cigarettes    Quit date: 03/10/2008  . Smokeless tobacco: Never Used     Comment: SOCIAL SMOKER IN COLLEGE FOR 73YRS -- NO SMOKING SINCE  . Alcohol use Yes     Comment: OCCASIONAL   . Drug use: No  . Sexual activity: Yes    Birth control/ protection: Pill   Other Topics Concern  . None   Social History Narrative  . 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  Amoxicillin Hives  . Tdap [Diphth-Acell Pertussis-Tetanus]   . Hydrocodeine [Dihydrocodeine] Rash  . Penicillins Rash    Metabolic Disorder Labs: No results found for: HGBA1C, MPG No results found for: PROLACTIN Lab Results  Component Value Date   CHOL 222 (H) 04/25/2016   TRIG 141 04/25/2016   HDL 81 04/25/2016   CHOLHDL 2.7 04/25/2016   VLDL 28 04/25/2016   LDLCALC 113 04/25/2016     Current Medications: Current Outpatient Prescriptions  Medication Sig Dispense Refill  . Cetirizine-Pseudoephedrine (ZYRTEC-D PO) Take by mouth as needed.    . fluticasone (FLONASE) 50 MCG/ACT nasal spray Place into both nostrils as needed for allergies or rhinitis.    . LESSINA-28 0.1-20 MG-MCG tablet Take 1 tablet by mouth daily.  11  . Probiotic Product (PROBIOTIC DAILY PO) Take by mouth.    . thiamine (VITAMIN B-1) 100 MG tablet Take 100 mg by mouth daily.    . valACYclovir (VALTREX) 1000 MG tablet TAKE 2 TABLETS TWICE A DAY FOR 2 DOSES FOR COLD SORES 30 tablet 7  . vortioxetine HBr (TRINTELLIX) 10 MG TABS Take 1  tablet (10 mg total) by mouth daily. 90 tablet 1   No current facility-administered medications for this visit.     Neurologic: Headache: Negative Seizure: Negative Paresthesias:Negative  Musculoskeletal: Strength & Muscle Tone: within normal limits Gait & Station: normal Patient leans: N/A  Psychiatric Specialty Exam: ROS  Blood pressure 130/82, pulse 88, height  (1.651 m), weight 181 lb (82.1 kg).Body mass index is 30.12 kg/m.  General Appearance: Casual and Fairly Groomed  Eye Contact:  Good  Speech:  Clear and Coherent  Volume:  Increased hearing impaired  Mood:  Dysphoric  Affect:  Congruent  Thought Process:  Coherent  Orientation:  Full (Time, Place, and Person)  Thought Content:  Logical  Suicidal Thoughts:  No  Homicidal Thoughts:  No  Memory:  Immediate;   Good  Judgement:  Good  Insight:  Good  Psychomotor Activity:  Normal  Concentration:   Concentration: Fair  Recall:  Fair  Fund of Knowledge:Fair  Language: Fair  Akathisia:  Negative  Handed:  Right  AIMS (if indicated):  0  Assets:  Communication Skills Desire for Improvement Financial Resources/Insurance Housing Intimacy Leisure Time Physical Health Resilience Social Support Talents/Skills Transportation Vocational/Educational  ADL's:  Intact  Cognition: WNL  Sleep:  7-9 hours    Treatment Plan Summary: Morayma Godown is a 31 year old female with a 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 She presents today to establish psychiatric care, specifically seeking additional medication recommendations given some of her intolerance and weight gain from previously tried SSRI and SNRI.  She is currently on Cymbalta 20 mg, with inadequate control of her depression and anxiety. We discussed a switch to Trintellix given the favorable side effect profile. Reviewed the risks and benefits of this. Ultimately she has a family history of positive response to Wellbutrin, and she has no absolute contraindications to treatment with Wellbutrin. While she has a history of childhood encephalitis and seizures, this was in an acute episode of suspected meningitis, and she did not continue to struggle with seizures throughout her childhood, teenage years or adulthood.  My concerns about using Wellbutrin for her, are primarily related to some of the anxious features she displays, in addition to her past struggles with headache and vertigo.  Will proceed as below and follow up in 2 months.  1. Moderate episode of recurrent major depressive disorder (HCC)    Discontinue Cymbalta Initiate Trintellix 5 mg x 1 week, then 10 mg daily RTC in 6-8 weeks Follow-up with therapy provider in this clinic  Burnard Leigh, MD 4/25/20182:49 PM

## 2017-03-21 ENCOUNTER — Encounter: Payer: Self-pay | Admitting: Family Medicine

## 2017-03-22 ENCOUNTER — Other Ambulatory Visit (HOSPITAL_COMMUNITY): Payer: Self-pay | Admitting: Psychiatry

## 2017-03-22 ENCOUNTER — Encounter (HOSPITAL_COMMUNITY): Payer: Self-pay | Admitting: Psychiatry

## 2017-03-25 ENCOUNTER — Telehealth (HOSPITAL_COMMUNITY): Payer: Self-pay

## 2017-03-25 ENCOUNTER — Encounter (HOSPITAL_COMMUNITY): Payer: Self-pay | Admitting: Psychiatry

## 2017-03-25 ENCOUNTER — Ambulatory Visit (INDEPENDENT_AMBULATORY_CARE_PROVIDER_SITE_OTHER): Payer: BLUE CROSS/BLUE SHIELD | Admitting: Family Medicine

## 2017-03-25 ENCOUNTER — Encounter: Payer: Self-pay | Admitting: Family Medicine

## 2017-03-25 VITALS — BP 121/82 | HR 99 | Temp 97.7°F | Resp 18 | Ht 64.96 in | Wt 178.0 lb

## 2017-03-25 DIAGNOSIS — J0110 Acute frontal sinusitis, unspecified: Secondary | ICD-10-CM

## 2017-03-25 MED ORDER — CLARITHROMYCIN ER 500 MG PO TB24: 1000.0000 mg | ORAL_TABLET | Freq: Every day | ORAL | 0 refills | Status: DC

## 2017-03-25 MED ORDER — PREDNISONE 20 MG PO TABS: ORAL_TABLET | ORAL | 0 refills | Status: DC

## 2017-03-25 NOTE — Progress Notes (Signed)
Subjective:    Patient ID: Patricia Morse, female    DOB: 18-Jul-1986, 31 y.o.   MRN: [id number] Chief Complaint  Patient presents with  . Facial Pain    congested  . Otalgia    feels like fluid in ears    HPI  Her sxs started over a week ago and had initial improvement but then wok up today much worse. She developed a ton of pressure in her face and eyes. She has been taking dayquil and nyquil round the clock.  She has flonase but stopped it sev d ago when she thought she was getting better.  Past Medical History:  Diagnosis Date  . Allergy   . Auditory neuropathy BILATERAL  . Bilateral sensorineural hearing loss    SECONDARY AUDITORY NEUROPATHY  . 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 Depression   . 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 Internal derangement of right knee   . PONV (postoperative nausea and vomiting) POSTOP COCHLEAR  IMPLANT 2006  . Seasonal allergies    Past Surgical History:  Procedure Laterality Date  . BILATERAL MIDDLE EAR EXPLORATION/ SEALING OF OVAL AND ROUND WINDOW FISTULAS  1999 (AGE 28)   PERILYMPH FISTULAS  . CHONDROPLASTY  03/13/2012   Procedure: CHONDROPLASTY;  Surgeon: Shelda Pal, MD;  Location: [facility];  Service: Orthopedics;  Laterality: Right;  . COCHLEAR IMPLANT  11-15-2005  (AGE 43)   LEFT EAR  (CLARION HIGH-RESOLUTION 90K MULTICHANNEL,  1-J ELECTRODE)  . KNEE ARTHROSCOPY  03/13/2012   Procedure: ARTHROSCOPY KNEE;  Surgeon: Shelda Pal, MD;  Location: [facility];  Service: Orthopedics;  Laterality: Right;  RIGHT KNEE DIAGNOSTIC AND OPERATIVE SCOPE  . WISDOM TOOTH EXTRACTION  AGE 62   DENTAL OFFICE   Current Outpatient Prescriptions on File Prior to Visit  Medication Sig Dispense Refill  . fluticasone (FLONASE) 50 MCG/ACT nasal spray Place into both nostrils as needed for allergies or rhinitis.    . LESSINA-28 0.1-20 MG-MCG tablet Take 1 tablet  by mouth daily.  11  . Probiotic Product (PROBIOTIC DAILY PO) Take by mouth.    . thiamine (VITAMIN B-1) 100 MG tablet Take 100 mg by mouth daily.    . valACYclovir (VALTREX) 1000 MG tablet TAKE 2 TABLETS TWICE A DAY FOR 2 DOSES FOR COLD SORES 30 tablet 7  . vortioxetine HBr (TRINTELLIX) 10 MG TABS Take 1 tablet (10 mg total) by mouth daily. 90 tablet 1   No current facility-administered medications on file prior to visit.    Allergies  Allergen Reactions  . Amoxicillin Hives  . Tdap [Diphth-Acell Pertussis-Tetanus]   . Hydrocodeine [Dihydrocodeine] Rash  . Penicillins Rash   Family History  Problem Relation Age of Onset  . Depression Mother   . Hypertension Mother   . Mental illness Mother    Social History   Social History  . Marital status: Single    Spouse name: N/A  . Number of children: N/A  . Years of education: N/A   Occupational History  . Claims Professional for [employer]   Social History Main Topics  . Smoking status: Former Smoker    Types: Cigarettes    Quit date: 03/10/2008  . Smokeless tobacco: Never Used     Comment: SOCIAL SMOKER IN COLLEGE FOR 49YRS -- NO SMOKING SINCE  . Alcohol use Yes     Comment: OCCASIONAL   .  Drug use: No  . Sexual activity: Yes    Birth control/ protection: Pill   Other Topics Concern  . None   Social History Narrative  . None   Depression screen [facility] 2/9 03/25/2017 11/16/2016 04/25/2016 03/06/2016 12/15/2015  Decreased Interest 0 0 0 0 0  Down, Depressed, Hopeless 0 0 0 0 0  PHQ - 2 Score 0 0 0 0 0      Review of Systems  Constitutional: Positive for activity change, appetite change, chills and fatigue. Negative for diaphoresis and fever.  HENT: Positive for congestion, ear pain, postnasal drip, rhinorrhea, sinus pain, sinus pressure, sneezing and sore throat. Negative for ear discharge, hearing loss, nosebleeds, trouble swallowing and voice change.   Eyes: Negative for discharge and itching.    Respiratory: Positive for cough. Negative for shortness of breath.   Cardiovascular: Negative for chest pain.  Gastrointestinal: Negative for abdominal pain, nausea and vomiting.  Musculoskeletal: Negative for neck pain and neck stiffness.  Skin: Negative for rash.  Neurological: Positive for headaches. Negative for dizziness and syncope.  Hematological: Positive for adenopathy.  Psychiatric/Behavioral: Positive for sleep disturbance.       Objective:   Physical Exam  Constitutional: She is oriented to person, place, and time. She appears well-developed and well-nourished. She appears lethargic. She appears ill. No distress.  HENT:  Head: Normocephalic and atraumatic.  Right Ear: External ear and ear canal normal. Tympanic membrane is retracted. A middle ear effusion is present.  Left Ear: External ear and ear canal normal. Tympanic membrane is retracted. A middle ear effusion is present.  Nose: Mucosal edema and rhinorrhea present. Right sinus exhibits maxillary sinus tenderness. Left sinus exhibits maxillary sinus tenderness.  Mouth/Throat: Uvula is midline and mucous membranes are normal. Posterior oropharyngeal erythema present. No oropharyngeal exudate, posterior oropharyngeal edema or tonsillar abscesses.  Eyes: Conjunctivae are normal. Right eye exhibits no discharge. Left eye exhibits no discharge. No scleral icterus.  Neck: Normal range of motion. Neck supple.  Cardiovascular: Normal rate, regular rhythm, normal heart sounds and intact distal pulses.   Pulmonary/Chest: Effort normal and breath sounds normal.  Lymphadenopathy:       Head (right side): Submandibular adenopathy present. No preauricular and no posterior auricular adenopathy present.       Head (left side): Submandibular adenopathy present. No preauricular and no posterior auricular adenopathy present.    She has no cervical adenopathy.       Right: No supraclavicular adenopathy present.       Left: No  supraclavicular adenopathy present.  Neurological: She is oriented to person, place, and time. She appears lethargic.  Skin: Skin is warm and dry. She is not diaphoretic. No erythema.  Psychiatric: She has a normal mood and affect. Her behavior is normal.         BP 121/82   Pulse 99   Temp 97.7 F (36.5 C) (Oral)   Resp 18   Ht 5' 4.96" (1.65 m)   Wt 178 lb (80.7 kg)   LMP 03/05/2017   SpO2 98%   BMI 29.66 kg/m   Assessment & Plan:   1. Acute non-recurrent frontal sinusitis     Meds ordered this encounter  Medications  . cetirizine (ZYRTEC) 10 MG tablet    Sig: Take 10 mg by mouth daily.  . predniSONE (DELTASONE) 20 MG tablet    Sig: Take 3 tabs qd x 3d, then 2 tabs qd x 3d then 1 tab qd x 3d.    Dispense:  18 tablet    Refill:  0  . clarithromycin (BIAXIN XL) 500 MG 24 hr tablet    Sig: Take 2 tablets (1,000 mg total) by mouth daily.    Dispense:  20 tablet    Refill:  0      Norberto Sorenson, M.D.  Primary Care at [facility] [address] [place], [place] [address] [phone] phone [phone] fax  04/09/17 3:02 PM

## 2017-03-25 NOTE — Telephone Encounter (Signed)
Medication management - prior authorization submitted online with CoverMyMeds for patient's Trintellex.  Decision pending.

## 2017-03-25 NOTE — Patient Instructions (Addendum)
   IF you received an x-ray today, you will receive an invoice from [facility]. Please contact [facility] at [phone] with questions or concerns regarding your invoice.   IF you received labwork today, you will receive an invoice from [facility]. Please contact [facility] at [phone] with questions or concerns regarding your invoice.   Our billing staff will not be able to assist you with questions regarding bills from these companies.  You will be contacted with the lab results as soon as they are available. The fastest way to get your results is to activate your My Chart account. Instructions are located on the last page of this paperwork. If you have not heard from us regarding the results in 2 weeks, please contact this office.      Sinusitis, Adult Sinusitis is soreness and inflammation of your sinuses. Sinuses are hollow spaces in the bones around your face. Your sinuses are located:  Around your eyes.  In the middle of your forehead.  Behind your nose.  In your cheekbones. Your sinuses and nasal passages are lined with a stringy fluid (mucus). Mucus normally drains out of your sinuses. When your nasal tissues become inflamed or swollen, the mucus can become trapped or blocked so air cannot flow through your sinuses. This allows bacteria, viruses, and funguses to grow, which leads to infection. Sinusitis can develop quickly and last for 7?10 days (acute) or for more than 12 weeks (chronic). Sinusitis often develops after a cold. What are the causes? This condition is caused by anything that creates swelling in the sinuses or stops mucus from draining, including:  Allergies.  Asthma.  Bacterial or viral infection.  Abnormally shaped bones between the nasal passages.  Nasal growths that contain mucus (nasal polyps).  Narrow sinus openings.  Pollutants, such as chemicals or irritants in the air.  A foreign object stuck in the nose.  A fungal  infection. This is rare. What increases the risk? The following factors may make you more likely to develop this condition:  Having allergies or asthma.  Having had a recent cold or respiratory tract infection.  Having structural deformities or blockages in your nose or sinuses.  Having a weak immune system.  Doing a lot of swimming or diving.  Overusing nasal sprays.  Smoking. What are the signs or symptoms? The main symptoms of this condition are pain and a feeling of pressure around the affected sinuses. Other symptoms include:  Upper toothache.  Earache.  Headache.  Bad breath.  Decreased sense of smell and taste.  A cough that may get worse at night.  Fatigue.  Fever.  Thick drainage from your nose. The drainage is often green and it may contain pus (purulent).  Stuffy nose or congestion.  Postnasal drip. This is when extra mucus collects in the throat or back of the nose.  Swelling and warmth over the affected sinuses.  Sore throat.  Sensitivity to light. How is this diagnosed? This condition is diagnosed based on symptoms, a medical history, and a physical exam. To find out if your condition is acute or chronic, your health care provider may:  Look in your nose for signs of nasal polyps.  Tap over the affected sinus to check for signs of infection.  View the inside of your sinuses using an imaging device that has a light attached (endoscope). If your health care provider suspects that you have chronic sinusitis, you may also:  Be tested for allergies.  Have a sample of   mucus taken from your nose (nasal culture) and checked for bacteria.  Have a mucus sample examined to see if your sinusitis is related to an allergy. If your sinusitis does not respond to treatment and it lasts longer than 8 weeks, you may have an MRI or CT scan to check your sinuses. These scans also help to determine how severe your infection is. In rare cases, a bone biopsy may  be done to rule out more serious types of fungal sinus disease. How is this treated? Treatment for sinusitis depends on the cause and whether your condition is chronic or acute. If a virus is causing your sinusitis, your symptoms will go away on their own within 10 days. You may be given medicines to relieve your symptoms, including:  Topical nasal decongestants. They shrink swollen nasal passages and let mucus drain from your sinuses.  Antihistamines. These drugs block inflammation that is triggered by allergies. This can help to ease swelling in your nose and sinuses.  Topical nasal corticosteroids. These are nasal sprays that ease inflammation and swelling in your nose and sinuses.  Nasal saline washes. These rinses can help to get rid of thick mucus in your nose. If your condition is caused by bacteria, you will be given an antibiotic medicine. If your condition is caused by a fungus, you will be given an antifungal medicine. Surgery may be needed to correct underlying conditions, such as narrow nasal passages. Surgery may also be needed to remove polyps. Follow these instructions at home: Medicines   Take, use, or apply over-the-counter and prescription medicines only as told by your health care provider. These may include nasal sprays.  If you were prescribed an antibiotic medicine, take it as told by your health care provider. Do not stop taking the antibiotic even if you start to feel better. Hydrate and Humidify   Drink enough water to keep your urine clear or pale yellow. Staying hydrated will help to thin your mucus.  Use a cool mist humidifier to keep the humidity level in your home above 50%.  Inhale steam for 10-15 minutes, 3-4 times a day or as told by your health care provider. You can do this in the bathroom while a hot shower is running.  Limit your exposure to cool or dry air. Rest   Rest as much as possible.  Sleep with your head raised (elevated).  Make sure to  get enough sleep each night. General instructions   Apply a warm, moist washcloth to your face 3-4 times a day or as told by your health care provider. This will help with discomfort.  Wash your hands often with soap and water to reduce your exposure to viruses and other germs. If soap and water are not available, use hand sanitizer.  Do not smoke. Avoid being around people who are smoking (secondhand smoke).  Keep all follow-up visits as told by your health care provider. This is important. Contact a health care provider if:  You have a fever.  Your symptoms get worse.  Your symptoms do not improve within 10 days. Get help right away if:  You have a severe headache.  You have persistent vomiting.  You have pain or swelling around your face or eyes.  You have vision problems.  You develop confusion.  Your neck is stiff.  You have trouble breathing. This information is not intended to replace advice given to you by your health care provider. Make sure you discuss any questions you have with   your health care provider. Document Released: 11/12/2005 Document Revised: 07/08/2016 Document Reviewed: 09/07/2015 Elsevier Interactive Patient Education  2017 Elsevier Inc.  

## 2017-03-26 ENCOUNTER — Ambulatory Visit: Payer: BLUE CROSS/BLUE SHIELD

## 2017-03-26 ENCOUNTER — Encounter: Payer: Self-pay | Admitting: Family Medicine

## 2017-03-27 ENCOUNTER — Encounter: Payer: Self-pay | Admitting: Family Medicine

## 2017-04-10 DIAGNOSIS — E669 Obesity, unspecified: Secondary | ICD-10-CM | POA: Diagnosis not present

## 2017-04-12 ENCOUNTER — Telehealth: Payer: Self-pay | Admitting: *Deleted

## 2017-04-12 NOTE — Telephone Encounter (Signed)
Received request for Medical records from [facility], forwarded to [place] for email/scan/SLS 05/18

## 2017-04-16 ENCOUNTER — Ambulatory Visit (INDEPENDENT_AMBULATORY_CARE_PROVIDER_SITE_OTHER): Payer: BLUE CROSS/BLUE SHIELD | Admitting: Psychology

## 2017-04-16 ENCOUNTER — Encounter (HOSPITAL_COMMUNITY): Payer: Self-pay | Admitting: Psychology

## 2017-04-16 DIAGNOSIS — F331 Major depressive disorder, recurrent, moderate: Secondary | ICD-10-CM

## 2017-04-17 NOTE — Progress Notes (Signed)
Comprehensive Clinical Assessment (CCA) Note  04/17/2017 Patricia Morse [id number]  Visit Diagnosis:      ICD-9-CM ICD-10-CM   1. Moderate episode of recurrent major depressive disorder (HCC) 296.32 F33.1       CCA Part One  Part One has been completed on paper by the patient.  (See scanned document in Chart Review)  CCA Part Two A  Intake/Chief Complaint:  CCA Intake With Chief Complaint CCA Part Two Date: 04/16/17 CCA Part Two Time: 1230 Chief Complaint/Presenting Problem: Pt is referred by Dr. Rene KocherEksir for counseling.  Pt began tx w/ Dr. Rene KocherEksir on 03/20/17 and dx w/MDD, recurrent, moderate. pt has been tx for depression since high school and began medication in college.  pt reported she saw a counselor while in college at [employer] that felt that made some progress w/. pt reported she has seen a counselor in the community for years but because she didn't accpet insurance couldn't go frequently due to cost and didnt' feel effective. Pt reports current stressors include her job- stressors of working w/ current age group and doing a job she mostly college students are working;  almost 32 and not accomplished things she thought she would;  looking for another job that pays better; finances; how hearing loss has impacted her life.  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   Patients Currently Reported Symptoms/Problems: pt reports that her mood remains feeling "blah".  pt reports she sleeps well.  pt reports she struggles sometimes w/ energy to do things. pt reports that exercise and getting out in the sun are helpful for her depression.  pt reports she will at times deal w/ worry and ruminating on worries.  pt reports she dealt w/ weight gain w/ increase of Cymbalta dose last year and has struggled w/ discontinuing due to feeling effects.  pt is happy that she has d/c cymbalta sucessfully and started on new medication.    pt informed she is hearing impaired in both ears- has cochlear implant in right ear and only has some hearing left in high pitch range in left ear.  pt reports she wouldn't benefit from interpreter because doesn't use sign language and between implant and reading lips she can communicate effectively.   Collateral Involvement: Dr. Unice BaileyEksir's note Individual's Strengths: seeking tx, support of boyfriend, exercise and getting outdoors in beneficial Individual's Preferences: "help me deal w/ feeling unaccomplished, talk about what happened w/ school, talk about hearing loss and how has effected life, how to not pressure but have communicaiton about relationship in the future" Type of Services Patient Feels Are Needed: counseling and medication managment.   Mental Health Symptoms Depression:  Depression: Change in energy/activity, Worthlessness, Difficulty Concentrating, Fatigue  Mania:  Mania: N/A  Anxiety:   Anxiety: Worrying, Fatigue, Difficulty concentrating  Psychosis:  Psychosis: N/A  Trauma:  Trauma: N/A  Obsessions:  Obsessions: N/A  Compulsions:  Compulsions: N/A  Inattention:  Inattention: N/A  Hyperactivity/Impulsivity:     Oppositional/Defiant Behaviors:  Oppositional/Defiant Behaviors: N/A  Borderline Personality:  Emotional Irregularity: N/A  Other Mood/Personality Symptoms:      Mental Status Exam Appearance and self-care  Stature:  Stature: Average  Weight:  Weight: Overweight  Clothing:  Clothing: Neat/clean  Grooming:  Grooming: Well-groomed  Cosmetic use:  Cosmetic Use: Age appropriate  Posture/gait:  Posture/Gait: Normal  Motor activity:  Motor Activity: Not Remarkable  Sensorium  Attention:  Attention: Normal  Concentration:  Concentration: Normal  Orientation:  Orientation: X5  Recall/memory:  Recall/Memory: Normal  Affect and Mood  Affect:  Affect: Appropriate  Mood:  Mood: Depressed  Relating  Eye contact:  Eye Contact: Normal  Facial expression:  Facial  Expression: Responsive  Attitude toward examiner:  Attitude Toward Examiner: Cooperative  Thought and Language  Speech flow: Speech Flow: Normal  Thought content:  Thought Content: Appropriate to mood and circumstances  Preoccupation:     Hallucinations:     Organization:     Company secretary of Knowledge:  Fund of Knowledge: Average  Intelligence:  Intelligence: Average  Abstraction:  Abstraction: Normal  Judgement:  Judgement: Normal  Reality Testing:  Reality Testing: Adequate  Insight:  Insight: Good  Decision Making:  Decision Making: Normal  Social Functioning  Social Maturity:  Social Maturity: Responsible  Social Judgement:  Social Judgement: Normal  Stress  Stressors:  Stressors: Work, Arts administrator, Illness  Coping Ability:  Coping Ability: Building surveyor Deficits:     Supports:      Family and Psychosocial History: Family history Marital status:  (Pt currently in relationship for 15 months- living together 6 months) Are you sexually active?: Yes Has your sexual activity been affected by drugs, alcohol, medication, or emotional stress?: medication side effect at times.  Does patient have children?: No  Childhood History:  Childhood History By whom was/is the patient raised?: Mother, Father Additional childhood history information: parents separated when she was 17 y/o.  pt reported adjusted well to separation.  Description of patient's relationship with caregiver when they were a child: pt reports positive relationship w/ mom and dad growing up  Patient's description of current relationship with people who raised him/her: pt reports visits parents every 2-4 weeks.  pt reports dad remarried when she turned 21y/o and positive relationship w/ stepmom as well.   Does patient have siblings?: No (pt has a step brother who is the same age as her- they get along well. ) Did patient suffer any verbal/emotional/physical/sexual abuse as a child?: No Did patient suffer from  severe childhood neglect?: No Has patient ever been sexually abused/assaulted/raped as an adolescent or adult?: No Was the patient ever a victim of a crime or a disaster?: No Witnessed domestic violence?: No Has patient been effected by domestic violence as an adult?: No  CCA Part Two B  Employment/Work Situation: Employment / Work Psychologist, occupational Employment situation: Employed Where is patient currently employed?: [employer], Program[employer], applications as Education officer, environmental How long has patient been employed?: 1.5 years Patient's job has been impacted by current illness: No What is the longest time patient has a held a job?: couple of years  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 Has patient ever been in the [employer]?: No  Education: Education Last Grade Completed: 16 Did Garment/textile technologist From [employer]?: Yes Did You Attend College?: Yes What Type of College Degree Do you Have?: Theatre manager in Psychology Did You Attend Graduate School?: No Did You Have An Individualized Education Program (IIEP): No Did You Have Any Difficulty At School?: Yes (difficulty when in radiology school- feels that impacted by impaired hearing) Were Any Medications Ever Prescribed For These Difficulties?: No  Religion: Religion/Spirituality Are You A Religious Person?: No How Might This Affect Treatment?: n/a  Leisure/Recreation: Leisure / Recreation Leisure and Hobbies: hiking, mountain biking, watching netflix, occasionally painting, writing  Exercise/Diet: Exercise/Diet Do You Exercise?: Yes What Type of Exercise Do You Do?: Run/Walk How Many Times  a Week Do You Exercise?: 1-3 times a week Have You Gained or Lost A Significant Amount of Weight in the Past Six Months?: Yes-Gained Number of Pounds Gained: 15 Do You Follow a Special Diet?: Yes Type of Diet: redcuced calorie Do You Have Any Trouble Sleeping?: No  CCA Part Two C  Alcohol/Drug Use: Alcohol / Drug Use History of alcohol / drug  use?: No history of alcohol / drug abuse                      CCA Part Three  ASAM's:  Six Dimensions of Multidimensional Assessment  Dimension 1:  Acute Intoxication and/or Withdrawal Potential:     Dimension 2:  Biomedical Conditions and Complications:     Dimension 3:  Emotional, Behavioral, or Cognitive Conditions and Complications:     Dimension 4:  Readiness to Change:     Dimension 5:  Relapse, Continued use, or Continued Problem Potential:     Dimension 6:  Recovery/Living Environment:      Substance use Disorder (SUD)    Social Function:  Social Functioning Social Maturity: Responsible Social Judgement: Normal  Stress:  Stress Stressors: Work, Arts administrator, Illness Coping Ability: Overwhelmed Patient Takes Medications The Way The Doctor Instructed?: Yes Priority Risk: Low Acuity  Risk Assessment- Self-Harm Potential: Risk Assessment For Self-Harm Potential Thoughts of Self-Harm: No current thoughts Method: No plan  Risk Assessment -Dangerous to Others Potential: Risk Assessment For Dangerous to Others Potential Method: No Plan  DSM5 Diagnoses: Patient Active Problem List   Diagnosis Date Noted  . Visual disturbance 04/25/2016  . Dizziness 04/25/2016  . Obesity, unspecified 06/08/2013  . Depression 10/10/2012  . Hearing loss 10/10/2012    Patient Centered Plan: Patient is on the following Treatment Plan(s):  Depression  Recommendations for Services/Supports/Treatments: Recommendations for Services/Supports/Treatments Recommendations For Services/Supports/Treatments: Individual Therapy, Medication Management  Treatment Plan Summary:   Pt to f/u as planned w/ Dr. Rene Kocher.  Pt to f/u w/ weekly counseling to address depression and meet goals.   [facility]

## 2017-04-23 DIAGNOSIS — H903 Sensorineural hearing loss, bilateral: Secondary | ICD-10-CM | POA: Diagnosis not present

## 2017-04-23 DIAGNOSIS — H933X3 Disorders of bilateral acoustic nerves: Secondary | ICD-10-CM | POA: Diagnosis not present

## 2017-05-07 ENCOUNTER — Ambulatory Visit (INDEPENDENT_AMBULATORY_CARE_PROVIDER_SITE_OTHER): Payer: BLUE CROSS/BLUE SHIELD | Admitting: Psychiatry

## 2017-05-07 ENCOUNTER — Encounter (HOSPITAL_COMMUNITY): Payer: Self-pay | Admitting: Psychiatry

## 2017-05-07 DIAGNOSIS — Z818 Family history of other mental and behavioral disorders: Secondary | ICD-10-CM | POA: Diagnosis not present

## 2017-05-07 DIAGNOSIS — Z87891 Personal history of nicotine dependence: Secondary | ICD-10-CM

## 2017-05-07 DIAGNOSIS — F331 Major depressive disorder, recurrent, moderate: Secondary | ICD-10-CM | POA: Diagnosis not present

## 2017-05-07 MED ORDER — VORTIOXETINE HBR 20 MG PO TABS: 20.0000 mg | ORAL_TABLET | Freq: Every day | ORAL | 0 refills | Status: DC

## 2017-05-07 NOTE — Progress Notes (Signed)
BH MD/PA/NP OP Progress Note  05/07/2017 2:14 PM Patricia Morse  MRN:  [id number]  Chief Complaint:  Chief Complaint    Follow-up     Subjective:  Patricia Morse reports that vortioxetine has been helpful, and she has not had any side effects with this compared to Cymbalta. She reports that she is no longer experiencing any withdrawal from Cymbalta. She wonders how she would do on a higher dose of vortioxetine given that she continues to have some breakthrough periods of depression.  Discussed increasing to 20 mg daily.  She has noted some sexual side effects from the medication, but also attributes some of this to relationship difficulties between her and her boyfriend - they're getting used to living together over the past few months. No acute safety issues or drug use. She agrees to follow-up in 3 months.  She also agrees to couple's therapy referral.  Discussed pregnancy planning, and I encouraged the patient to let me know as soon as she and her boyfriend are at a place where they may be planning to get pregnant. vortioxetine has not been studied in pregnancy as much as other SSRIs, so I would prefer to switch her to Zoloft or Prozac if she was to get pregnant.  Visit Diagnosis:    ICD-10-CM   1. Moderate episode of recurrent major depressive disorder (HCC) F33.1 vortioxetine HBr (TRINTELLIX) 20 MG TABS    Ambulatory referral to Psychology    Past Psychiatric History: See intake H&P for full details. Reviewed, with no updates at this time.   Past Medical History:  Past Medical History:  Diagnosis Date  . Allergy   . Auditory neuropathy BILATERAL  . Bilateral sensorineural hearing loss    SECONDARY AUDITORY NEUROPATHY  . 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 Depression   . 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 Internal derangement of right knee   . PONV (postoperative nausea and vomiting) POSTOP COCHLEAR  IMPLANT 2006  .  Seasonal allergies     Past Surgical History:  Procedure Laterality Date  . BILATERAL MIDDLE EAR EXPLORATION/ SEALING OF OVAL AND ROUND WINDOW FISTULAS  1999 (AGE 7)   PERILYMPH FISTULAS  . CHONDROPLASTY  03/13/2012   Procedure: CHONDROPLASTY;  Surgeon: Shelda Pal, MD;  Location: [facility];  Service: Orthopedics;  Laterality: Right;  . COCHLEAR IMPLANT  11-15-2005  (AGE 19)   LEFT EAR  (CLARION HIGH-RESOLUTION 90K MULTICHANNEL,  1-J ELECTRODE)  . KNEE ARTHROSCOPY  03/13/2012   Procedure: ARTHROSCOPY KNEE;  Surgeon: Shelda Pal, MD;  Location: [facility];  Service: Orthopedics;  Laterality: Right;  RIGHT KNEE DIAGNOSTIC AND OPERATIVE SCOPE  . WISDOM TOOTH EXTRACTION  AGE 11   DENTAL OFFICE    Family Psychiatric History: See intake H&P for full details. Reviewed, with no updates at this time.   Family History:  Family History  Problem Relation Age of Onset  . Depression Mother   . Hypertension Mother   . Mental illness Mother   . Anxiety disorder Maternal Grandmother     Social History:  Social History   Social History  . Marital status: Single    Spouse name: N/A  . Number of children: N/A  . Years of education: N/A   Occupational History  . Claims Professional for [employer]   Social History Main Topics  . Smoking status: Former Smoker  Types: Cigarettes    Quit date: 03/10/2008  . Smokeless tobacco: Never Used     Comment: SOCIAL SMOKER IN COLLEGE FOR 48YRS -- NO SMOKING SINCE  . Alcohol use 1.8 - 3.0 oz/week    3 - 5 Glasses of wine per week     Comment: OCCASIONAL   . Drug use: No  . Sexual activity: Yes    Partners: Male    Birth control/ protection: Pill   Other Topics Concern  . None   Social History Narrative  . None    Allergies:  Allergies  Allergen Reactions  . Amoxicillin Hives  . Tdap [Diphth-Acell Pertussis-Tetanus]   . Hydrocodeine [Dihydrocodeine] Rash  . Penicillins Rash     Metabolic Disorder Labs: No results found for: HGBA1C, MPG No results found for: PROLACTIN Lab Results  Component Value Date   CHOL 222 (H) 04/25/2016   TRIG 141 04/25/2016   HDL 81 04/25/2016   CHOLHDL 2.7 04/25/2016   VLDL 28 04/25/2016   LDLCALC 113 04/25/2016     Current Medications: Current Outpatient Prescriptions  Medication Sig Dispense Refill  . cetirizine (ZYRTEC) 10 MG tablet Take 10 mg by mouth daily.    . fluticasone (FLONASE) 50 MCG/ACT nasal spray Place into both nostrils as needed for allergies or rhinitis.    . LESSINA-28 0.1-20 MG-MCG tablet Take 1 tablet by mouth daily.  11  . Probiotic Product (PROBIOTIC DAILY PO) Take by mouth.    . thiamine (VITAMIN B-1) 100 MG tablet Take 100 mg by mouth daily.    . valACYclovir (VALTREX) 1000 MG tablet TAKE 2 TABLETS TWICE A DAY FOR 2 DOSES FOR COLD SORES 30 tablet 7  . vortioxetine HBr (TRINTELLIX) 20 MG TABS Take 20 mg by mouth daily. 90 tablet 0   No current facility-administered medications for this visit.     Neurologic: Headache: Negative Seizure: Negative Paresthesias: Negative  Musculoskeletal: Strength & Muscle Tone: within normal limits Gait & Station: normal Patient leans: N/A  Psychiatric Specialty Exam: ROS  Blood pressure 116/78, pulse 94, height 5\' 5"  (1.651 m), weight 183 lb (83 kg), SpO2 99 %.Body mass index is 30.45 kg/m.  General Appearance: Casual and Well Groomed  Eye Contact:  Good  Speech:  Clear and Coherent  Volume:  Normal  Mood:  Euthymic  Affect:  Congruent  Thought Process:  Goal Directed  Orientation:  Full (Time, Place, and Person)  Thought Content: Logical   Suicidal Thoughts:  No  Homicidal Thoughts:  No  Memory:  Immediate;   Good  Judgement:  Good  Insight:  Good  Psychomotor Activity:  Normal  Concentration:  Concentration: Good  Recall:  NA  Fund of Knowledge: Good  Language: Good  Akathisia:  Negative  Handed:  Right  AIMS (if indicated):  0  Assets:   Communication Skills Desire for Improvement Financial Resources/Insurance Housing Intimacy Leisure Time Physical Health Resilience Social Support Talents/Skills Transportation Vocational/Educational  ADL's:  Intact  Cognition: WNL  Sleep:  7-9 hours     Treatment Plan Summary: [name] Lewey is a 31 year old female with a childhood history of encephalitis and hearing loss as a result of nerve damage.  She has a history of MDD with anxious features. She has had a fairly good response to vortioxetine and has fairly minimal side effects. Her side effects have been a limiting factor in increasing her previous doses of antidepressant for affect.  She has had some sexual side effects, but this is complicated by relationship  difficulties.  She is agreeable to couple's therapy referral, and is continuing to engage in individual therapy herself.  If she continues to have sexual side effects, she and I have discussed starting Wellbutrin, in place of vortioxetine, and we can make that change together in office if she chooses.  I would ideally taper her to a lower dose of vortioxetine and initiate Wellbutrin 150 mg XL.  No acute safety issues at this time and we will follow-up in 3 months or sooner if needed.  1. Moderate episode of recurrent major depressive disorder (HCC)    Increase vortioxetine To 20 mg daily Referral to psychology for couples therapy Continue individual therapy Turn to clinic in 3 months  Burnard LeighAlexander Arya Eksir, MD 05/07/2017, 2:14 PM

## 2017-05-08 ENCOUNTER — Encounter: Payer: Self-pay | Admitting: Family Medicine

## 2017-05-27 ENCOUNTER — Ambulatory Visit (HOSPITAL_COMMUNITY): Payer: Self-pay | Admitting: Psychology

## 2017-06-03 ENCOUNTER — Ambulatory Visit (INDEPENDENT_AMBULATORY_CARE_PROVIDER_SITE_OTHER): Payer: BLUE CROSS/BLUE SHIELD | Admitting: Psychology

## 2017-06-03 DIAGNOSIS — F331 Major depressive disorder, recurrent, moderate: Secondary | ICD-10-CM | POA: Diagnosis not present

## 2017-06-03 NOTE — Progress Notes (Signed)
   THERAPIST PROGRESS NOTE  Session Time: 9.03am-10.03am  Participation Level: Active  Behavioral Response: Well GroomedAlertDepressed  Type of Therapy: Individual Therapy  Treatment Goals addressed: Diagnosis: MDd and goal 1.  Interventions: CBT and Supportive  Summary: Patricia Morse is a 31 y.o. female who presents with affect WNL.  Pt reports still struggles w/ some depressed thinking.  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  Pt was able to be aware that wasn't just disability that resulted in her dismissed from program.  Pt discussed wanting to seek new job-possible go back to school.  Pt discussed that idea of "can be anything you want" isn't completely accurate and has to cope w/ barriers of being hard of hearing.  Pt discussed steps she could take towards change and acknowledges makes excuses.   Pt good insight into struggles feeling w/ self worth and doubting w/ her disability.   Suicidal/Homicidal: Nowithout intent/plan  Therapist Response: Assessed pt current functioning per pt report.  Processed w/ pt her school experience and how impacted and feed self doubts.  Explored w/pt next steps towards change in job/career.  Plan: Return again in 1 weeks.  Diagnosis: MDD    Forde RadonYATES,Patricia Morse, [facility] 06/03/2017

## 2017-06-05 ENCOUNTER — Encounter: Payer: Self-pay | Admitting: Family Medicine

## 2017-06-08 NOTE — Progress Notes (Addendum)
[facility] [address], [address] [place], [place] [address] [phone]  Date:  06/10/2017   Name:  Patricia Morse   DOB:  21-Apr-1986   MRN:  [id number]  PCP:  Pearline Cables, MD    Chief Complaint: Annual Exam   History of Present Illness:  Patricia Morse is a 31 y.o. very pleasant female patient who presents with the following:  History of depression, obesity, hearing loss Here today for a CPE Pap: her GYN is at [facility].  Labs: about a year ago She is fasting today  She is seeing psychiatry, Dr. Gaspar Skeeters, now- she started on Trintillix. She will see him again in September  BP Readings from Last 3 Encounters:  06/10/17 118/81  03/25/17 121/82  03/13/17 107/84   She has been in her current relationship for 18 months She has been frustrated by her lack of weight loss.  She does tend to track her calories.  She has been working on weight loss on a pretty consistent basis for about a year.   Thyroid is normal She has not made much progress and is getting frustrated by this   Wt Readings from Last 3 Encounters:  06/10/17 178 lb (80.7 kg)  03/25/17 178 lb (80.7 kg)  03/13/17 180 lb 6.4 oz (81.8 kg)   A year ago she weighed 165 lbs Former smoker Occasional ETOH Patient Active Problem List   Diagnosis Date Noted  . Visual disturbance 04/25/2016  . Dizziness 04/25/2016  . Obesity, unspecified 06/08/2013  . Depression 10/10/2012  . Hearing loss 10/10/2012    Past Medical History:  Diagnosis Date  . Allergy   . Auditory neuropathy BILATERAL  . Bilateral sensorineural hearing loss    SECONDARY AUDITORY NEUROPATHY  . 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 Depression   . 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 Internal derangement of right knee   . PONV (postoperative nausea and vomiting) POSTOP COCHLEAR  IMPLANT 2006  . Seasonal allergies     Past  Surgical History:  Procedure Laterality Date  . BILATERAL MIDDLE EAR EXPLORATION/ SEALING OF OVAL AND ROUND WINDOW FISTULAS  1999 (AGE 83)   PERILYMPH FISTULAS  . CHONDROPLASTY  03/13/2012   Procedure: CHONDROPLASTY;  Surgeon: Shelda Pal, MD;  Location: [facility];  Service: Orthopedics;  Laterality: Right;  . COCHLEAR IMPLANT  11-15-2005  (AGE 7)   LEFT EAR  (CLARION HIGH-RESOLUTION 90K MULTICHANNEL,  1-J ELECTRODE)  . KNEE ARTHROSCOPY  03/13/2012   Procedure: ARTHROSCOPY KNEE;  Surgeon: Shelda Pal, MD;  Location: [facility];  Service: Orthopedics;  Laterality: Right;  RIGHT KNEE DIAGNOSTIC AND OPERATIVE SCOPE  . WISDOM TOOTH EXTRACTION  AGE 13   DENTAL OFFICE    Social History  Substance Use Topics  . Smoking status: Former Smoker    Types: Cigarettes    Quit date: 03/10/2008  . Smokeless tobacco: Never Used     Comment: SOCIAL SMOKER IN COLLEGE FOR 3YRS -- NO SMOKING SINCE  . Alcohol use 1.8 - 3.0 oz/week    3 - 5 Glasses of wine per week     Comment: OCCASIONAL     Family History  Problem Relation Age of Onset  . Depression Mother   . Hypertension Mother   . Mental illness Mother   . Anxiety disorder Maternal Grandmother  Allergies  Allergen Reactions  . Amoxicillin Hives  . Tdap [Diphth-Acell Pertussis-Tetanus]   . Hydrocodeine [Dihydrocodeine] Rash  . Penicillins Rash    Medication list has been reviewed and updated.  Current Outpatient Prescriptions on File Prior to Visit  Medication Sig Dispense Refill  . cetirizine (ZYRTEC) 10 MG tablet Take 10 mg by mouth daily.    . fluticasone (FLONASE) 50 MCG/ACT nasal spray Place into both nostrils as needed for allergies or rhinitis.    . LESSINA-28 0.1-20 MG-MCG tablet Take 1 tablet by mouth daily.  11  . Probiotic Product (PROBIOTIC DAILY PO) Take by mouth.    . thiamine (VITAMIN B-1) 100 MG tablet Take 100 mg by mouth daily.    . valACYclovir (VALTREX) 1000 MG tablet TAKE 2  TABLETS TWICE A DAY FOR 2 DOSES FOR COLD SORES 30 tablet 7  . vortioxetine HBr (TRINTELLIX) 20 MG TABS Take 20 mg by mouth daily. 90 tablet 0   No current facility-administered medications on file prior to visit.     Review of Systems:  As per HPI- otherwise negative.   Physical Examination: Vitals:   06/10/17 0834  BP: 118/81  Pulse: 85  Temp: 97.8 F (36.6 C)   Vitals:   06/10/17 0834  Weight: 178 lb (80.7 kg)  Height: 5' 4.9" (1.648 m)   Body mass index is 29.71 kg/m. Ideal Body Weight: Weight in (lb) to have BMI = 25: 149.5  GEN: WDWN, NAD, Non-toxic, A & O x 3, overweight, looks well HEENT: Atraumatic, Normocephalic. Neck supple. No masses, No LAD.  cochlear implant left ear  Oropharynx normal.  PEERL,EOMI.   Ears and Nose: No external deformity. CV: RRR, No M/G/R. No JVD. No thrill. No extra heart sounds. PULM: CTA B, no wheezes, crackles, rhonchi. No retractions. No resp. distress. No accessory muscle use. ABD: S, NT, ND. No rebound. No HSM. EXTR: No c/c/e NEURO Normal gait.  PSYCH: Normally interactive. Conversant. Not depressed or anxious appearing.  Calm demeanor.  Reduced reflexes due to illness as a child that also affected her hearing   Assessment and Plan: Annual physical exam  Screening for diabetes mellitus - Plan: Comprehensive metabolic panel, Hemoglobin A1c  Screening for deficiency anemia - Plan: CBC  Screening for hyperlipidemia - Plan: Lipid panel here today for a CPE We checked her TSH last fall and it was normal discussed weight loss rx today- ?phentermine. She would like to continue to try on her own but will let me know if she changes her mind Continue exercise and healthy eating Will plan further follow- up pending labs.   Lab Results  Component Value Date   TSH 1.17 10/10/2016   Signed Abbe Amsterdam, MD  Received her labs and sent message to pt  Results for orders placed or performed in visit on 06/10/17  CBC  Result  Value Ref Range   WBC 5.4 4.0 - 10.5 K/uL   RBC 3.93 3.87 - 5.11 Mil/uL   Platelets 235.0 150.0 - 400.0 K/uL   Hemoglobin 12.0 12.0 - 15.0 g/dL   HCT 16.1 (L) 09.6 - 04.5 %   MCV 87.3 78.0 - 100.0 fl   MCHC 34.9 30.0 - 36.0 g/dL   RDW 40.9 81.1 - 91.4 %  Comprehensive metabolic panel  Result Value Ref Range   Sodium 135 135 - 145 mEq/L   Potassium 3.8 3.5 - 5.1 mEq/L   Chloride 105 96 - 112 mEq/L   CO2 24 19 - 32 mEq/L  Glucose, Bld 95 70 - 99 mg/dL   BUN 10 6 - 23 mg/dL   Creatinine, Ser 4.090.69 0.40 - 1.20 mg/dL   Total Bilirubin 0.3 0.2 - 1.2 mg/dL   Alkaline Phosphatase 41 39 - 117 U/L   AST 19 0 - 37 U/L   ALT 22 0 - 35 U/L   Total Protein 6.6 6.0 - 8.3 g/dL   Albumin 3.7 3.5 - 5.2 g/dL   Calcium 8.6 8.4 - 81.110.5 mg/dL   GFR 914.78106.53 >29.56>60.00 mL/min  Hemoglobin A1c  Result Value Ref Range   Hgb A1c MFr Bld 5.1 4.6 - 6.5 %  Lipid panel  Result Value Ref Range   Cholesterol 185 0 - 200 mg/dL   Triglycerides 213.0117.0 0.0 - 149.0 mg/dL   HDL 86.5748.50 >84.69>39.00 mg/dL   VLDL 62.923.4 0.0 - 52.840.0 mg/dL   LDL Cholesterol 413113 (H) 0 - 99 mg/dL   Total CHOL/HDL Ratio 4    NonHDL 136.18

## 2017-06-10 ENCOUNTER — Ambulatory Visit (INDEPENDENT_AMBULATORY_CARE_PROVIDER_SITE_OTHER): Payer: BLUE CROSS/BLUE SHIELD | Admitting: Family Medicine

## 2017-06-10 ENCOUNTER — Ambulatory Visit (INDEPENDENT_AMBULATORY_CARE_PROVIDER_SITE_OTHER): Payer: BLUE CROSS/BLUE SHIELD | Admitting: Psychology

## 2017-06-10 ENCOUNTER — Encounter: Payer: Self-pay | Admitting: Family Medicine

## 2017-06-10 VITALS — BP 118/81 | HR 85 | Temp 97.8°F | Ht 64.9 in | Wt 178.0 lb

## 2017-06-10 DIAGNOSIS — F331 Major depressive disorder, recurrent, moderate: Secondary | ICD-10-CM | POA: Diagnosis not present

## 2017-06-10 DIAGNOSIS — Z130 Encounter for screening for diseases of the blood and blood-forming organs and certain disorders involving the immune mechanism: Secondary | ICD-10-CM

## 2017-06-10 DIAGNOSIS — Z0000 Encounter for general adult medical examination without abnormal findings: Secondary | ICD-10-CM | POA: Diagnosis not present

## 2017-06-10 DIAGNOSIS — Z13220 Encounter for screening for lipoid disorders: Secondary | ICD-10-CM

## 2017-06-10 DIAGNOSIS — Z131 Encounter for screening for diabetes mellitus: Secondary | ICD-10-CM | POA: Diagnosis not present

## 2017-06-10 LAB — LIPID PANEL
CHOLESTEROL: 185 mg/dL (ref 0–200)
HDL: 48.5 mg/dL (ref >39.00)
LDL CALC: 113 mg/dL — AB (ref 0–99)
NONHDL: 136.18
Total CHOL/HDL Ratio: 4
Triglycerides: 117 mg/dL (ref 0.0–149.0)
VLDL: 23.4 mg/dL (ref 0.0–40.0)

## 2017-06-10 LAB — COMPREHENSIVE METABOLIC PANEL
ALBUMIN: 3.7 g/dL (ref 3.5–5.2)
ALK PHOS: 41 U/L (ref 39–117)
ALT: 22 U/L (ref 0–35)
AST: 19 U/L (ref 0–37)
BUN: 10 mg/dL (ref 6–23)
CO2: 24 mEq/L (ref 19–32)
CREATININE: 0.69 mg/dL (ref 0.40–1.20)
Calcium: 8.6 mg/dL (ref 8.4–10.5)
Chloride: 105 mEq/L (ref 96–112)
GFR: 106.53 mL/min (ref >60.00)
GLUCOSE: 95 mg/dL (ref 70–99)
POTASSIUM: 3.8 meq/L (ref 3.5–5.1)
SODIUM: 135 meq/L (ref 135–145)
TOTAL PROTEIN: 6.6 g/dL (ref 6.0–8.3)
Total Bilirubin: 0.3 mg/dL (ref 0.2–1.2)

## 2017-06-10 LAB — CBC
HEMATOCRIT: 34.3 % — AB (ref 36.0–46.0)
Hemoglobin: 12 g/dL (ref 12.0–15.0)
MCHC: 34.9 g/dL (ref 30.0–36.0)
MCV: 87.3 fl (ref 78.0–100.0)
Platelets: 235 10*3/uL (ref 150.0–400.0)
RBC: 3.93 Mil/uL (ref 3.87–5.11)
RDW: 12.7 % (ref 11.5–15.5)
WBC: 5.4 10*3/uL (ref 4.0–10.5)

## 2017-06-10 LAB — HEMOGLOBIN A1C: Hgb A1c MFr Bld: 5.1 % (ref 4.6–6.5)

## 2017-06-10 NOTE — Progress Notes (Signed)
   THERAPIST PROGRESS NOTE  Session Time: 2.35pm-3.25pm  Participation Level: Active  Behavioral Response: Neat and Well GroomedAlertaffect wnl  Type of Therapy: Individual Therapy  Treatment Goals addressed: Diagnosis: MDd and goal 1.  Interventions: CBT and Supportive  Summary: Patricia Morse is a 31 y.o. female who presents with affect wnl.  Pt reported that she is starting to look at grad school and consider this as an option. Pt reports awareness that her summer position has been less stressful- but that not option to maintain the hours she needs going into fall and may have to return to afterschool program.  Pt reports she is going to meet w/ someone about resume and looking at jobs.  Pt identified next steps towards looking into grad school.  pt discussed relationship issues and increased awareness of norms and contributing factors.  Pt discussed positives of her relationship and strength been for her. .   Suicidal/Homicidal: Nowithout intent/plan  Therapist Response: ASsessed pt current functioning per pt report.  Processed w/pt her decision making and next steps re: her goals.  Explored w/ pt relationship- worries, norms and communication and problem solving together.   Plan: Return again in 2 weeks.  Diagnosis: MDD   Forde RadonYATES,Patricia Morse, [facility] 06/10/2017

## 2017-06-10 NOTE — Patient Instructions (Signed)
It was good to see you today I will be in touch with your labs You are doing a good job following healthy habits and exercising- I hope that the scale will soon start to show you more results.    Let me know if you are interested in trying a medication for weight loss such as phentermine I will look for any sign of diabetes in your labs today- however, I suspect that when you start to feel shaky at work that your blood sugar has gotten low and you may simply need to eat something

## 2017-06-15 ENCOUNTER — Encounter: Payer: Self-pay | Admitting: Family Medicine

## 2017-06-24 ENCOUNTER — Ambulatory Visit (HOSPITAL_COMMUNITY): Payer: Self-pay | Admitting: Psychology

## 2017-07-06 ENCOUNTER — Encounter: Payer: Self-pay | Admitting: Family Medicine

## 2017-07-16 ENCOUNTER — Encounter (HOSPITAL_COMMUNITY): Payer: Self-pay | Admitting: Psychiatry

## 2017-07-22 ENCOUNTER — Ambulatory Visit (HOSPITAL_COMMUNITY): Payer: Self-pay | Admitting: Psychology

## 2017-07-27 ENCOUNTER — Ambulatory Visit (INDEPENDENT_AMBULATORY_CARE_PROVIDER_SITE_OTHER): Payer: BLUE CROSS/BLUE SHIELD | Admitting: Physician Assistant

## 2017-07-27 ENCOUNTER — Encounter: Payer: Self-pay | Admitting: Physician Assistant

## 2017-07-27 VITALS — BP 111/75 | HR 106 | Temp 99.4°F | Resp 20 | Ht 65.0 in | Wt 177.1 lb

## 2017-07-27 DIAGNOSIS — J029 Acute pharyngitis, unspecified: Secondary | ICD-10-CM

## 2017-07-27 LAB — POCT RAPID STREP A (OFFICE): RAPID STREP A SCREEN: POSITIVE — AB

## 2017-07-27 MED ORDER — CLINDAMYCIN HCL 300 MG PO CAPS: 300.0000 mg | ORAL_CAPSULE | Freq: Three times a day (TID) | ORAL | 0 refills | Status: DC

## 2017-07-27 MED ORDER — NAPROXEN 500 MG PO TABS: 500.0000 mg | ORAL_TABLET | Freq: Two times a day (BID) | ORAL | 0 refills | Status: DC

## 2017-07-27 NOTE — Progress Notes (Signed)
shay 

## 2017-07-27 NOTE — Patient Instructions (Signed)
     IF you received an x-ray today, you will receive an invoice from [facility]. Please contact [facility] at [phone] with questions or concerns regarding your invoice.   IF you received labwork today, you will receive an invoice from [facility]. Please contact [facility] at [phone] with questions or concerns regarding your invoice.   Our billing staff will not be able to assist you with questions regarding bills from these companies.  You will be contacted with the lab results as soon as they are available. The fastest way to get your results is to activate your My Chart account. Instructions are located on the last page of this paperwork. If you have not heard from us regarding the results in 2 weeks, please contact this office.     

## 2017-07-27 NOTE — Progress Notes (Signed)
07/30/2017 12:32 PM   DOB: 1986/08/18 / MRN: [id number]  SUBJECTIVE:  Patricia Morse Shumaker is a 31 y.o. female presenting for sore throat starting yesterday worse with eating and associates fever and body aches. Getting worse.   She is allergic to amoxicillin; tdap [diphth-acell pertussis-tetanus]; hydrocodeine [dihydrocodeine]; and penicillins.   She  has a past medical history of Allergy; Auditory neuropathy (BILATERAL); Bilateral sensorineural hearing loss; Cochlear implant in place (11-15-2005); Depression; History of viral encephalitis (AS BABY); Internal derangement of right knee; PONV (postoperative nausea and vomiting) (POSTOP COCHLEAR  IMPLANT 2006); and Seasonal allergies.    She  reports that she quit smoking about 9 years ago. Her smoking use included Cigarettes. She has never used smokeless tobacco. She reports that she drinks about 1.8 - 3.0 oz of alcohol per week . She reports that she does not use drugs. She  reports that she currently engages in sexual activity and has had female partners. She reports using the following method of birth control/protection: Pill. The patient  has a past surgical history that includes Cochlear implant (11-15-2005  (AGE 16)); BILATERAL MIDDLE EAR EXPLORATION/ SEALING OF OVAL AND ROUND WINDOW FISTULAS (1999 (AGE 55)); Wisdom tooth extraction (AGE 68); Knee arthroscopy (03/13/2012); and Chondroplasty (03/13/2012).  Her family history includes Anxiety disorder in her maternal grandmother; Depression in her mother; Hypertension in her mother; Mental illness in her mother.  Review of Systems  Constitutional: Positive for fever and malaise/fatigue.  HENT: Positive for sore throat.   Respiratory: Negative for cough, hemoptysis, sputum production, shortness of breath and wheezing.   Cardiovascular: Negative for chest pain.  Neurological: Negative for dizziness.    The problem list and medications were reviewed and updated by myself where necessary and exist  elsewhere in the encounter.   OBJECTIVE:  BP 111/75   Pulse (!) 106   Temp 99.4 F (37.4 C) (Oral)   Resp 20   Ht 5\' 5"  (1.651 m)   Wt 177 lb 2 oz (80.3 kg)   LMP 07/24/2017   SpO2 98%   BMI 29.48 kg/m     Physical Exam  Constitutional: She is active.  Non-toxic appearance.  HENT:  Mouth/Throat: Uvula is midline and mucous membranes are normal. Oropharyngeal exudate and posterior oropharyngeal erythema present. No posterior oropharyngeal edema or tonsillar abscesses.  Cardiovascular: Normal rate, regular rhythm, S1 normal, S2 normal, normal heart sounds and intact distal pulses.  Exam reveals no gallop, no friction rub and no decreased pulses.   No murmur heard. Pulmonary/Chest: Effort normal. No stridor. No tachypnea. No respiratory distress. She has no wheezes. She has no rales.  Abdominal: She exhibits no distension.  Musculoskeletal: She exhibits no edema.  Neurological: She is alert.  Skin: Skin is warm and dry. She is not diaphoretic. No pallor.    Results for orders placed or performed in visit on 07/27/17 (from the past 72 hour(s))  POCT rapid strep A     Status: Abnormal   Collection Time: 07/27/17  3:53 PM  Result Value Ref Range   Rapid Strep A Screen Positive (A) Negative    No results found.  ASSESSMENT AND PLAN:  Patricia Morse was seen today for generalized body aches, fever and sore throat.  Diagnoses and all orders for this visit:  Sore throat: Pen allergic. Clinda and naprosyn.  RTC as needed.  -     Culture, Group A Strep -     POCT rapid strep A -     clindamycin (CLEOCIN) 300  MG capsule; Take 1 capsule (300 mg total) by mouth 3 (three) times daily. -     naproxen (NAPROSYN) 500 MG tablet; Take 1 tablet (500 mg total) by mouth 2 (two) times daily with a meal.    The patient is advised to call or return to clinic if she does not see an improvement in symptoms, or to seek the care of the closest emergency department if she worsens with the above plan.     Deliah Boston, MHS, PA-C Primary [facility] at [facility] 07/30/2017 12:32 PM

## 2017-08-07 ENCOUNTER — Ambulatory Visit (INDEPENDENT_AMBULATORY_CARE_PROVIDER_SITE_OTHER): Payer: BLUE CROSS/BLUE SHIELD | Admitting: Psychiatry

## 2017-08-07 DIAGNOSIS — R6882 Decreased libido: Secondary | ICD-10-CM

## 2017-08-07 DIAGNOSIS — F331 Major depressive disorder, recurrent, moderate: Secondary | ICD-10-CM

## 2017-08-07 DIAGNOSIS — Z87891 Personal history of nicotine dependence: Secondary | ICD-10-CM

## 2017-08-07 DIAGNOSIS — Z818 Family history of other mental and behavioral disorders: Secondary | ICD-10-CM

## 2017-08-07 MED ORDER — VORTIOXETINE HBR 20 MG PO TABS: 20.0000 mg | ORAL_TABLET | Freq: Every day | ORAL | 0 refills | Status: DC

## 2017-08-07 MED ORDER — BUPROPION HCL ER (SR) 100 MG PO TB12: 100.0000 mg | ORAL_TABLET | Freq: Every day | ORAL | 1 refills | Status: DC

## 2017-08-07 MED ORDER — VORTIOXETINE HBR 20 MG PO TABS: 20.0000 mg | ORAL_TABLET | Freq: Every day | ORAL | 2 refills | Status: DC

## 2017-08-07 NOTE — Progress Notes (Signed)
BH MD/PA/NP OP Progress Note  08/07/2017 11:12 AM Patricia Morse  MRN:  [id number]  Chief Complaint: poor sex drive  HPI: Patricia Morse continues to struggle with anxiety, feels like the depression symptoms are fairly stable with Trintellix 20 mg. She reports that much of her stressors increasing anxiety have been related to work.  She reports that she knows that she has poor coping strategies and she needs to see a therapist.   I made a referral to a therapist in [place]. She continues to struggle with poor sex drive, and anorgasmia.  She reports that she and her boyfriend have sex approximately once a week. We spent time discussing her expectations about sex, and her focus on sex as being a keep part of their relationship, and a metric by which she measures whether or not they are in a good place in terms of their relationship. I continue to encourage her to talk about this in therapy and consider couples therapy. She would like to try a low-dose of Wellbutrin, which we have spoken about before. I reiterated the risk of seizures, she confirms that she has never had epilepsy, but did have febrile seizures as a child when she had an acute bout of encephalitis. I reiterated the risks of increased anxiety, dry mouth, irritability. We spent time reviewing some of the potential benefits. We agreed to start at a low dose of 100 mg SR, and to continue the current dose of Trintellix.   Visit Diagnosis:    ICD-10-CM   1. Moderate episode of recurrent major depressive disorder (HCC) F33.1 vortioxetine HBr (TRINTELLIX) 20 MG TABS    Ambulatory referral to Psychology    DISCONTINUED: vortioxetine HBr (TRINTELLIX) 20 MG TABS    Past Psychiatric History: See intake H&P for full details. Reviewed, with no updates at this time.   Past Medical History:  Past Medical History:  Diagnosis Date  . Allergy   . Auditory neuropathy BILATERAL  . Bilateral sensorineural hearing loss    SECONDARY AUDITORY  NEUROPATHY  . 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 Depression   . 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 Internal derangement of right knee   . PONV (postoperative nausea and vomiting) POSTOP COCHLEAR  IMPLANT 2006  . Seasonal allergies     Past Surgical History:  Procedure Laterality Date  . BILATERAL MIDDLE EAR EXPLORATION/ SEALING OF OVAL AND ROUND WINDOW FISTULAS  1999 (AGE 14)   PERILYMPH FISTULAS  . CHONDROPLASTY  03/13/2012   Procedure: CHONDROPLASTY;  Surgeon: Shelda PalMatthew D Olin, MD;  [facility];  Service: Orthopedics;  Laterality: Right;  . COCHLEAR IMPLANT  11-15-2005  (AGE 28)   LEFT EAR  (CLARION HIGH-RESOLUTION 90K MULTICHANNEL,  1-J ELECTRODE)  . KNEE ARTHROSCOPY  03/13/2012   Procedure: ARTHROSCOPY KNEE;  Surgeon: Shelda PalMatthew D Olin, MD;  [facility];  Service: Orthopedics;  Laterality: Right;  RIGHT KNEE DIAGNOSTIC AND OPERATIVE SCOPE  . WISDOM TOOTH EXTRACTION  AGE 83   DENTAL OFFICE    Family Psychiatric History: See intake H&P for full details. Reviewed, with no updates at this time.   Family History:  Family History  Problem Relation Age of Onset  . Depression Mother   . Hypertension Mother   . Mental illness Mother   . Anxiety disorder Maternal Grandmother     Social History:  Social History   Social History  .  Marital status: Single    Spouse name: N/A  . Number of children: N/A  . Years of education: N/A   Occupational History  . Claims Professional for [employer]   Social History Main Topics  . Smoking status: Former Smoker    Types: Cigarettes    Quit date: 03/10/2008  . Smokeless tobacco: Never Used     Comment: SOCIAL SMOKER IN COLLEGE FOR 78YRS -- NO SMOKING SINCE  . Alcohol use 1.8 - 3.0 oz/week    3 - 5 Glasses of wine per week     Comment: OCCASIONAL   . Drug use: No  . Sexual activity: Yes    Partners: Male    Birth  control/ protection: Pill   Other Topics Concern  . Not on file   Social History Narrative  . No narrative on file    Allergies:  Allergies  Allergen Reactions  . Amoxicillin Hives  . Tdap [Diphth-Acell Pertussis-Tetanus]   . Hydrocodeine [Dihydrocodeine] Rash  . Penicillins Rash    Metabolic Disorder Labs: Lab Results  Component Value Date   HGBA1C 5.1 06/10/2017   No results found for: PROLACTIN Lab Results  Component Value Date   CHOL 185 06/10/2017   TRIG 117.0 06/10/2017   HDL 48.50 06/10/2017   CHOLHDL 4 06/10/2017   VLDL 23.4 06/10/2017   LDLCALC 113 (H) 06/10/2017   LDLCALC 113 04/25/2016   Lab Results  Component Value Date   TSH 1.17 10/10/2016   TSH 1.499 11/04/2013    Therapeutic Level Labs: No results found for: LITHIUM No results found for: VALPROATE No components found for:  CBMZ  Current Medications: Current Outpatient Prescriptions  Medication Sig Dispense Refill  . APRI 0.15-30 MG-MCG tablet   5  . buPROPion (WELLBUTRIN SR) 100 MG 12 hr tablet Take 1 tablet (100 mg total) by mouth daily. 90 tablet 1  . cetirizine (ZYRTEC) 10 MG tablet Take 10 mg by mouth daily.    . clindamycin (CLEOCIN) 300 MG capsule Take 1 capsule (300 mg total) by mouth 3 (three) times daily. 30 capsule 0  . fluticasone (FLONASE) 50 MCG/ACT nasal spray Place into both nostrils as needed for allergies or rhinitis.    . naproxen (NAPROSYN) 500 MG tablet Take 1 tablet (500 mg total) by mouth 2 (two) times daily with a meal. 30 tablet 0  . Probiotic Product (PROBIOTIC DAILY PO) Take by mouth.    . thiamine (VITAMIN B-1) 100 MG tablet Take 100 mg by mouth daily.    . valACYclovir (VALTREX) 1000 MG tablet TAKE 2 TABLETS TWICE A DAY FOR 2 DOSES FOR COLD SORES 30 tablet 7  . vortioxetine HBr (TRINTELLIX) 20 MG TABS Take 20 mg by mouth daily. 30 tablet 2   No current facility-administered medications for this visit.      Musculoskeletal: Strength & Muscle Tone: within normal  limits Gait & Station: normal Patient leans: N/A  Psychiatric Specialty Exam: ROS  Last menstrual period 07/24/2017.There is no height or weight on file to calculate BMI.  General Appearance: Casual and Fairly Groomed  Eye Contact:  Good  Speech:  Clear and Coherent  Volume:  Normal  Mood:  Euthymic  Affect:  Appropriate and Congruent  Thought Process:  Goal Directed  Orientation:  Full (Time, Place, and Person)  Thought Content: Logical   Suicidal Thoughts:  No  Homicidal Thoughts:  No  Memory:  Immediate;   Good  Judgement:  Fair  Insight:  Fair  Psychomotor Activity:  Normal  Concentration:  Concentration: Good  Recall:  Good  Fund of Knowledge: Good  Language: Good  Akathisia:  Negative  Handed:  Right  AIMS (if indicated): not done  Assets:  Communication Skills Desire for Improvement Financial Resources/Insurance Housing Intimacy Leisure Time Physical Health Social Support Talents/Skills Transportation Vocational/Educational  ADL's:  Intact  Cognition: WNL  Sleep:  Good   Screenings: PHQ2-9     Office Visit from 03/25/2017 in Primary Care at [place] Office Visit from 11/16/2016 in Primary Care at [facility] Visit from 04/25/2016 in Primary Care at [facility] Visit from 03/06/2016 in Primary Care at [facility] Visit from 12/15/2015 in Primary Care at 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 Her mood symptoms appear to be fairly well controlled with Trintellix, but she continues to struggle with periods of anxiety related to external stressors, and sexual side effects from SSRI. We discussed augmenting with Wellbutrin 100 mg SR, and reviewed the risks and benefits of Wellbutrin. She is also agreeable to participate in individual therapy to develop a more consistent set of coping strategies.   No acute safety issues.  1. Moderate episode of recurrent major depressive disorder (HCC)    Continue Trintellix 20 mg Wellbutrin 100 mg SR daily Reviewed the risks and benefits associated with Wellbutrin, including the risk of seizure Return to clinic in 8 weeks Referral for individual therapy; patient given the number to contact clinic to schedule an appointment  Burnard Leigh, MD 08/07/2017, 11:12 AM

## 2017-08-12 ENCOUNTER — Ambulatory Visit (HOSPITAL_COMMUNITY): Payer: BLUE CROSS/BLUE SHIELD | Admitting: Psychology

## 2017-08-12 ENCOUNTER — Encounter (HOSPITAL_COMMUNITY): Payer: Self-pay | Admitting: Psychiatry

## 2017-08-19 ENCOUNTER — Other Ambulatory Visit (HOSPITAL_COMMUNITY): Payer: Self-pay | Admitting: Psychiatry

## 2017-08-19 DIAGNOSIS — F331 Major depressive disorder, recurrent, moderate: Secondary | ICD-10-CM

## 2017-08-21 ENCOUNTER — Ambulatory Visit (INDEPENDENT_AMBULATORY_CARE_PROVIDER_SITE_OTHER): Payer: BLUE CROSS/BLUE SHIELD | Admitting: Psychology

## 2017-08-21 DIAGNOSIS — F331 Major depressive disorder, recurrent, moderate: Secondary | ICD-10-CM

## 2017-08-21 NOTE — Progress Notes (Signed)
   THERAPIST PROGRESS NOTE  Session Time: 11.08am-11.58am  Participation Level: Active  Behavioral Response: Well GroomedAlertdepressed and anxious  Type of Therapy: Individual Therapy  Treatment Goals addressed: Diagnosis: MDD and goal 1.  Interventions: CBT  Summary: Patricia Morse is a 31 y.o. female who presents with report of depression and anxiety.  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  Pt is doing ok w/ job but recognizes not what wanting to do for career. Pt reported boyfriend and her had talk last night and he is really encouraging her to get a full time job and be able to support herself as still relies on dad for financial support.  Pt reported that this led to thoughts that he was saying she's not good enough.  Pt was able to acknowledge as not true and reframe that cares and encouraging her towards own goals.  However pt is still very uncertain about if she wants to go back to school or what other type of job to look for. Pt acknowledges get overwhelmed by this a lot and will avoid instead of coping w/ anxiety or worry of failure.  Suicidal/Homicidal: Nowithout intent/plan  Therapist Response: Assessed pt current functioning per pt report. Processed w/pt transition w/ job, conversation w/ boyfriend and worries and anxiety. Assisted w/ pt reframing negative thinking. Discussed pt avoidance and how doesn't help towards goals.    Plan: Return again in 2 weeks.  Diagnosis: MDD    Forde Radon, [facility] 08/21/2017

## 2017-08-26 ENCOUNTER — Ambulatory Visit (INDEPENDENT_AMBULATORY_CARE_PROVIDER_SITE_OTHER): Payer: BLUE CROSS/BLUE SHIELD | Admitting: Psychology

## 2017-08-26 ENCOUNTER — Encounter (HOSPITAL_COMMUNITY): Payer: Self-pay | Admitting: Psychiatry

## 2017-08-26 DIAGNOSIS — F320 Major depressive disorder, single episode, mild: Secondary | ICD-10-CM | POA: Diagnosis not present

## 2017-08-26 DIAGNOSIS — F331 Major depressive disorder, recurrent, moderate: Secondary | ICD-10-CM

## 2017-08-26 NOTE — Progress Notes (Signed)
   THERAPIST PROGRESS NOTE  Session Time: 9.06am-10am  Participation Level: Active  Behavioral Response: Well GroomedAlertaffect wnl  Type of Therapy: Individual Therapy  Treatment Goals addressed: Diagnosis: MDd and goal 1.  Interventions: CBT, Supportive and Other: career interests  Summary: Patricia Morse is a 31 y.o. female who presents with full and bright affect.  Pt reported that she did set up for appointment w/ practice Dr. Rene Kocher recommended but didn't notice difference in hours available- so will f/u w/ Eksir.  Pt reported that after last meeting pt did decide to contact clinical head of department in radiology and express interest in return to program and if that is possible.  Pt reported that she had a conversation w/ dad and felt discouraged and inadequate talking to him as felt that wasn't meeting expectations.  Pt was able to acknowledge that dad wasn't authority on her career path and that in fact dad is in job that makes good money but not happy.  Pt acknowledged that she  "wants to make everyone else happy- but can't".  Pt discussed her interests and that she is reaching out to those she knows to gather information.    Suicidal/Homicidal: Nowithout intent/plan  Therapist Response: Assessed pt current functioning per pt report.  Reflected positive steps pt is taking to career exploration.  Discussed w/ pt not allowing others opinions to be the only things she considers when making choice for self.  Processed w/ pt her interactions w/ dad.  Encouraged pt that making right steps to exploring this decision for self.   Plan: Return again in for Dr. Rene Kocher as scheduled.  Pt to f/u w/ outpatient counseling w/ practice that scheduled with to meet her needs for hours.  Expressed that pt is welcome to return if needed.   Diagnosis: MDD, mild    YATES,LEANNE, LPC 08/26/2017

## 2017-09-04 ENCOUNTER — Encounter (HOSPITAL_COMMUNITY): Payer: Self-pay | Admitting: Psychiatry

## 2017-09-05 ENCOUNTER — Other Ambulatory Visit (HOSPITAL_COMMUNITY): Payer: Self-pay | Admitting: Psychiatry

## 2017-09-05 MED ORDER — DULOXETINE HCL 30 MG PO CPEP: 30.0000 mg | ORAL_CAPSULE | Freq: Every day | ORAL | 2 refills | Status: DC

## 2017-09-19 ENCOUNTER — Ambulatory Visit (HOSPITAL_COMMUNITY): Payer: Self-pay | Admitting: Psychology

## 2017-09-25 ENCOUNTER — Ambulatory Visit (HOSPITAL_COMMUNITY): Payer: Self-pay | Admitting: Psychology

## 2017-09-25 ENCOUNTER — Ambulatory Visit (INDEPENDENT_AMBULATORY_CARE_PROVIDER_SITE_OTHER): Payer: BLUE CROSS/BLUE SHIELD | Admitting: Licensed Clinical Social Worker

## 2017-09-25 ENCOUNTER — Encounter (HOSPITAL_COMMUNITY): Payer: Self-pay | Admitting: Psychiatry

## 2017-09-25 DIAGNOSIS — F3341 Major depressive disorder, recurrent, in partial remission: Secondary | ICD-10-CM

## 2017-10-07 ENCOUNTER — Encounter: Payer: Self-pay | Admitting: Family Medicine

## 2017-10-07 ENCOUNTER — Ambulatory Visit (HOSPITAL_COMMUNITY): Payer: BLUE CROSS/BLUE SHIELD | Admitting: Psychiatry

## 2017-10-07 ENCOUNTER — Encounter (HOSPITAL_COMMUNITY): Payer: Self-pay | Admitting: Psychiatry

## 2017-10-07 VITALS — BP 130/78 | HR 88 | Ht 65.0 in | Wt 177.0 lb

## 2017-10-07 DIAGNOSIS — Z9114 Patient's other noncompliance with medication regimen: Secondary | ICD-10-CM

## 2017-10-07 DIAGNOSIS — Z818 Family history of other mental and behavioral disorders: Secondary | ICD-10-CM

## 2017-10-07 DIAGNOSIS — Z87891 Personal history of nicotine dependence: Secondary | ICD-10-CM

## 2017-10-07 DIAGNOSIS — F331 Major depressive disorder, recurrent, moderate: Secondary | ICD-10-CM

## 2017-10-07 MED ORDER — FLUOXETINE HCL 10 MG PO CAPS: 10.0000 mg | ORAL_CAPSULE | Freq: Every day | ORAL | 0 refills | Status: DC

## 2017-10-07 MED ORDER — FLUOXETINE HCL 20 MG PO CAPS: 20.0000 mg | ORAL_CAPSULE | Freq: Every day | ORAL | 1 refills | Status: DC

## 2017-10-07 NOTE — Progress Notes (Signed)
BH MD/PA/NP OP Progress Note  10/07/2017 10:48 AM Patricia Morse  MRN:  [id number]  Chief Complaint: Still depressed, not doing well  HPI: Patient reports that she self discontinued Trintellix after tapering it for a week.  It was affecting her sexual drive, then she Pharmacist, hospital asking for Cymbalta, then decided against taking this because of the withdrawal from Cymbalta.  She continued on Wellbutrin at the low dose of 100 mg.  She continues to think about going back on Cymbalta or back on Trintellix.  I spent time with the patient having a frank discussion with her about how concerned I am that she tends to isolate her medications without discussing with writer, and I suspect that this is contributing to her mood instability.  I educated her that this is not healthy for her brain, and I am quite concerned about this behavior. she continues to struggle with day-to-day irritability, oscillations and feeling good or feeling bad.  No episodes consistent with mania or hypomania, largely mood symptoms related to poor frustration tolerance and external stressors.  I spent time with the patient discussing Prozac given the long half-life.  I educated her that I will not be making any additional changes to her medication regimen so that we can have at least 6 months of longevity to see how her mood and affect is doing on a consistent medication regimen.  Discussed the typical side effects and intolerance of Prozac, expectations for mood improvement.  Agreed to start at 10 mg daily for 2 weeks, then increase to 20 mg daily.  The patient may follow-up with this writer in 6 months.  Encouraged her to continue weekly therapy.  She reports multiple external stressors and challenges and scheduling therapy.  I had a frank conversation with the patient once again about making time and space for these types of healthcare needs, and encouraged her to work more diligently on consistently following up in therapy.  She  has multiple cancellations at this office where she was previously seen for therapy intake.  Visit Diagnosis:    ICD-10-CM   1. Moderate episode of recurrent major depressive disorder (HCC) F33.1   2. H/O medication noncompliance Z91.14     Past Psychiatric History: See intake H&P for full details. Reviewed, with no updates at this time.   Past Medical History:  Past Medical History:  Diagnosis Date  . Allergy   . Auditory neuropathy BILATERAL  . Bilateral sensorineural hearing loss    SECONDARY AUDITORY NEUROPATHY  . 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 Depression   . 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 Internal derangement of right knee   . PONV (postoperative nausea and vomiting) POSTOP COCHLEAR  IMPLANT 2006  . Seasonal allergies     Past Surgical History:  Procedure Laterality Date  . BILATERAL MIDDLE EAR EXPLORATION/ SEALING OF OVAL AND ROUND WINDOW FISTULAS  1999 (AGE 59)   PERILYMPH FISTULAS  . COCHLEAR IMPLANT  11-15-2005  (AGE 10)   LEFT EAR  (CLARION HIGH-RESOLUTION 90K MULTICHANNEL,  1-J ELECTRODE)  . WISDOM TOOTH EXTRACTION  AGE 70   DENTAL OFFICE    Family Psychiatric History: See intake H&P for full details. Reviewed, with no updates at this time.   Family History:  Family History  Problem Relation Age of Onset  . Depression Mother   . Hypertension Mother   . Mental illness Mother   . Anxiety  disorder Maternal Grandmother     Social History:  Social History   Socioeconomic History  . Marital status: Single    Spouse name: None  . Number of children: None  . Years of education: None  . Highest education level: None  Social Needs  . Financial resource strain: None  . Food insecurity - worry: None  . Food insecurity - inability: None  . Transportation needs - medical: None  . Transportation needs - non-medical: None  Occupational History  . 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 Smoking status: Former Smoker    Types: Cigarettes    Last attempt to quit: 03/10/2008    Years since quitting: 9.5  . Smokeless tobacco: Never Used  . Tobacco comment: SOCIAL SMOKER IN COLLEGE FOR 84YRS -- NO SMOKING SINCE  Substance and Sexual Activity  . Alcohol use: Yes    Alcohol/week: 1.8 - 3.0 oz    Types: 3 - 5 Glasses of wine per week    Comment: OCCASIONAL   . Drug use: No  . Sexual activity: Yes    Partners: Male    Birth control/protection: Pill  Other Topics Concern  . None  Social History Narrative  . None    Allergies:  Allergies  Allergen Reactions  . Amoxicillin Hives  . Tdap [Diphth-Acell Pertussis-Tetanus]   . Hydrocodeine [Dihydrocodeine] Rash  . Penicillins Rash    Metabolic Disorder Labs: Lab Results  Component Value Date   HGBA1C 5.1 06/10/2017   No results found for: PROLACTIN Lab Results  Component Value Date   CHOL 185 06/10/2017   TRIG 117.0 06/10/2017   HDL 48.50 06/10/2017   CHOLHDL 4 06/10/2017   VLDL 23.4 06/10/2017   LDLCALC 113 (H) 06/10/2017   LDLCALC 113 04/25/2016   Lab Results  Component Value Date   TSH 1.17 10/10/2016   TSH 1.499 11/04/2013    Therapeutic Level Labs: No results found for: LITHIUM No results found for: VALPROATE No components found for:  CBMZ  Current Medications: Current Outpatient Medications  Medication Sig Dispense Refill  . APRI 0.15-30 MG-MCG tablet   5  . cetirizine (ZYRTEC) 10 MG tablet Take 10 mg by mouth daily.    . naproxen (NAPROSYN) 500 MG tablet Take 1 tablet (500 mg total) by mouth 2 (two) times daily with a meal. 30 tablet 0  . Probiotic Product (PROBIOTIC DAILY PO) Take by mouth.    . valACYclovir (VALTREX) 1000 MG tablet TAKE 2 TABLETS TWICE A DAY FOR 2 DOSES FOR COLD SORES 30 tablet 7  . FLUoxetine (PROZAC) 10 MG capsule Take 1 capsule (10 mg total) daily by mouth. 14 capsule 0  . FLUoxetine (PROZAC) 20 MG capsule Take 1 capsule  (20 mg total) daily by mouth. 90 capsule 1  . fluticasone (FLONASE) 50 MCG/ACT nasal spray Place into both nostrils as needed for allergies or rhinitis.     No current facility-administered medications for this visit.      Musculoskeletal: Strength & Muscle Tone: within normal limits Gait & Station: normal Patient leans: N/A  Psychiatric Specialty Exam: ROS  Blood pressure 130/78, pulse 88, height 5\' 5"  (1.651 m), weight 177 lb (80.3 kg).Body mass index is 29.45 kg/m.  General Appearance: Casual and Fairly Groomed  Eye Contact:  Fair  Speech:  Clear and Coherent  Volume:  Normal  Mood:  Anxious, Depressed and Dysphoric  Affect:  Congruent  Thought Process:  Goal  Directed and Descriptions of Associations: Intact  Orientation:  Full (Time, Place, and Person)  Thought Content: Logical   Suicidal Thoughts:  No  Homicidal Thoughts:  No  Memory:  Immediate;   Fair  Judgement:  Poor  Insight:  Shallow  Psychomotor Activity:  Normal  Concentration:  Attention Span: Good  Recall:  Good  Fund of Knowledge: Fair  Language: Good  Akathisia:  Negative  Handed:  Right  AIMS (if indicated): not done  Assets:  Communication Skills Desire for Improvement Financial Resources/Insurance Housing Intimacy Social Support Transportation Vocational/Educational  ADL's:  Intact  Cognition: WNL  Sleep:  Fair   Screenings: PHQ2-9     Office Visit from 03/25/2017 in [facility] at [place] Office Visit from [facility] from 03[facility]06/[facility] from 12/15/2015 [facility]  PHQ-2 Total Score  0  0  0  0  0       Assessment and Plan: Sharman CrateWhitley Shumaker is a 31 year old female with a history of major depressive disorder, recurrent, with significant difficulties and medication compliance, often self titrating her self-tapering medications.  There has been  substantial difficulty in having any improvement in her mood, largely due to inconsistent medication regimen.  I spent time with the patient discussing my concerns about her ambivalent participation in care, and self-directed medication changes.  She continues to be ambivalent about taking Wellbutrin, versus Celexa, versus Trintellix, versus other SSRI.  I offered her a couple pathways in terms of care, one of them being discontinuation of psychiatric medications, with an emphasis on individual therapy.  The other option is to continue in individual therapy with one psychiatric medication on board for the next 6 months, so that we have a better sense of her mood and affect.  She was agreeable to the latter, and we agreed to proceed as below and follow-up in 6 months but.  1. Moderate episode of recurrent major depressive disorder (HCC)   2. H/O medication noncompliance     Status of current problems: unchanged  Labs Ordered: No orders of the defined types were placed in this encounter.   Labs Reviewed: na  Collateral Obtained/Records Reviewed: n/a  Plan:  Discontinue Wellbutrin Patient self discontinued Trintellix Patient asked for Cymbalta, then decided not to start it prior to this visit - discontinued at pharmacy Initiate Prozac 10 mg daily, increase to 20 mg daily in 2 weeks Prozac has a half-life of 17 days, and would be the most forgiving if the patient continues to self titrate and self taper her medications Return to clinic in 6 months Continue to emphasize the importance of individual therapy  I spent 25 minutes with the patient in direct face-to-face clinical care.  Greater than 50% of this time was spent in counseling and coordination of care with the patient.    Burnard LeighAlexander Arya Eksir, MD 10/07/2017, 10:48 AM

## 2017-10-09 ENCOUNTER — Ambulatory Visit (HOSPITAL_COMMUNITY): Payer: Self-pay | Admitting: Psychology

## 2017-10-23 ENCOUNTER — Ambulatory Visit (INDEPENDENT_AMBULATORY_CARE_PROVIDER_SITE_OTHER): Payer: BLUE CROSS/BLUE SHIELD | Admitting: Licensed Clinical Social Worker

## 2017-10-23 DIAGNOSIS — F3341 Major depressive disorder, recurrent, in partial remission: Secondary | ICD-10-CM | POA: Diagnosis not present

## 2017-10-25 DIAGNOSIS — F3340 Major depressive disorder, recurrent, in remission, unspecified: Secondary | ICD-10-CM | POA: Diagnosis not present

## 2017-10-25 DIAGNOSIS — F4322 Adjustment disorder with anxiety: Secondary | ICD-10-CM | POA: Diagnosis not present

## 2017-10-29 NOTE — Progress Notes (Signed)
[facility] at [facility] [address], [address] [place], [place] [address] [phone]  Date:  10/31/2017   Name:  Patricia Morse   DOB:  04-05-1986   MRN:  [id number]  PCP:  Pearline Cables, MD    Chief Complaint: Knee Pain (c/o ongoing left knee pain that has been present for a couple of years.  Hx of surgery on right knee. ) and Back Pain (c/o possible work related )   History of Present Illness:  Patricia Morse is a 31 y.o. very pleasant female patient who presents with the following:  Follow-up visit today- I last saw her in July  History of depression, obesity, hearing loss Here today for a CPE Pap: her GYN is at [facility].  Labs: about a year ago She is fasting today Her psychiatrist is Dr. Gaspar Skeeters  BP Readings from Last 3 Encounters:  10/31/17 102/70  07/27/17 111/75  06/10/17 118/81   Wt Readings from Last 3 Encounters:  10/31/17 177 lb 12.8 oz (80.6 kg)  07/27/17 177 lb 2 oz (80.3 kg)  06/10/17 178 lb (80.7 kg)   She has noted some popping and locking up of her left knee.  It may also feel unstable. This occurs mostly when she is standing up at work.   She used to a be runner and no longer feels like she can run She has noted more pain for a couple of weeks, but some trouble for a few years. Over the last few weeks however the sx have started to affect her life She has not noted any swelling or effusion  She did have an operation on her RIGHT knee in 2013- she was having similar sx, she was having a lot of pain with going up and down stairs.  She can not have an MRI due to her cochlear implant.  She had a scope and they cleaned some material out from behind her patella.    This was per Lajoyce Corners  Also, she notes a possible lower back injury. 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  Thinks that she hurt her back but she does not recall any specific injury She is  taking aleve on a regular basis She is trying to wear supportive shoes She is using ice and heat She notes that her back may ache   She is otherwise doing ok She will get a canker sore if she bites the inside of her lip Her mom has some medication that she uses for this which she let's Devone borrow-   LMP 11/6- she is on OCP  She is still seeing the same man- they have been together for about 2 years now.  She hopes they will be engaged soon!     Patient Active Problem List   Diagnosis Date Noted  . Visual disturbance 04/25/2016  . Dizziness 04/25/2016  . Obesity, unspecified 06/08/2013  . Depression 10/10/2012  . Hearing loss 10/10/2012    Past Medical History:  Diagnosis Date  . Allergy   . Auditory neuropathy BILATERAL  . Bilateral sensorineural hearing loss    SECONDARY AUDITORY NEUROPATHY  . 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 Depression   . 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 Internal derangement of right knee   . PONV (postoperative nausea and vomiting)  POSTOP COCHLEAR  IMPLANT 2006  . Seasonal allergies     Past Surgical History:  Procedure Laterality Date  . BILATERAL MIDDLE EAR EXPLORATION/ SEALING OF OVAL AND ROUND WINDOW FISTULAS  1999 (AGE 47)   PERILYMPH FISTULAS  . CHONDROPLASTY  03/13/2012   Procedure: CHONDROPLASTY;  Surgeon: Shelda PalMatthew D Olin, MD;  [facility];  Service: Orthopedics;  Laterality: Right;  . COCHLEAR IMPLANT  11-15-2005  (AGE 64)   LEFT EAR  (CLARION HIGH-RESOLUTION 90K MULTICHANNEL,  1-J ELECTRODE)  . KNEE ARTHROSCOPY  03/13/2012   Procedure: ARTHROSCOPY KNEE;  Surgeon: Shelda PalMatthew D Olin, MD;  [facility];  Service: Orthopedics;  Laterality: Right;  RIGHT KNEE DIAGNOSTIC AND OPERATIVE SCOPE  . WISDOM TOOTH EXTRACTION  AGE 46   DENTAL OFFICE    Social History   Tobacco Use  . Smoking status: Former Smoker    Types: Cigarettes     Last attempt to quit: 03/10/2008    Years since quitting: 9.6  . Smokeless tobacco: Never Used  . Tobacco comment: SOCIAL SMOKER IN COLLEGE FOR 43YRS -- NO SMOKING SINCE  Substance Use Topics  . Alcohol use: Yes    Alcohol/week: 1.8 - 3.0 oz    Types: 3 - 5 Glasses of wine per week    Comment: OCCASIONAL   . Drug use: No    Family History  Problem Relation Age of Onset  . Depression Mother   . Hypertension Mother   . Mental illness Mother   . Anxiety disorder Maternal Grandmother     Allergies  Allergen Reactions  . Amoxicillin Hives  . Tdap [Diphth-Acell Pertussis-Tetanus]   . Hydrocodeine [Dihydrocodeine] Rash  . Penicillins Rash    Medication list has been reviewed and updated.  Current Outpatient Medications on File Prior to Visit  Medication Sig Dispense Refill  . APRI 0.15-30 MG-MCG tablet   5  . cetirizine (ZYRTEC) 10 MG tablet Take 10 mg by mouth daily.    Marland Kitchen. FLUoxetine (PROZAC) 10 MG capsule Take 1 capsule (10 mg total) daily by mouth. 14 capsule 0  . Probiotic Product (PROBIOTIC DAILY PO) Take by mouth.    . valACYclovir (VALTREX) 1000 MG tablet TAKE 2 TABLETS TWICE A DAY FOR 2 DOSES FOR COLD SORES 30 tablet 7   No current facility-administered medications on file prior to visit.     Review of Systems:  As per HPI- otherwise negative. No radiation of back pain to the legs, no numbness or weakness of her legs  Exercise helps her depression- she is still able to bike, eliptical, swim although running bothers her knees    Physical Examination: Vitals:   10/31/17 1005  BP: 102/70  Pulse: 88  Temp: 97.9 F (36.6 C)  SpO2: 96%   Vitals:   10/31/17 1005  Weight: 177 lb 12.8 oz (80.6 kg)  Height: 5\' 5"  (1.651 m)   Body mass index is 29.59 kg/m. Ideal Body Weight: Weight in (lb) to have BMI = 25: 149.9  GEN: WDWN, NAD, Non-toxic, A & O x 3, overweight, looks well HEENT: Atraumatic, Normocephalic. Neck supple. No masses, No LAD. Ears and Nose: No  external deformity. CV: RRR, No M/G/R. No JVD. No thrill. No extra heart sounds. PULM: CTA B, no wheezes, crackles, rhonchi. No retractions. No resp. distress. No accessory muscle use. EXTR: No c/c/e NEURO Normal gait.  PSYCH: Normally interactive. Conversant. Not depressed or anxious appearing.  Calm demeanor.  Right knee: minimal crepitus but stable  Left knee: minimal crepitus, pops with ROM.  Stable knee No effusion, no redness or heat.  Not tender to exam Negative SLR bilaterally  She does have mild tenderness over her right lower back muscles and SI joint.  No redness or swelling Assessment and Plan: Chronic pain of left knee - Plan: Ambulatory referral to Orthopedic Surgery  Recurrent left knee instability - Plan: Ambulatory referral to Orthopedic Surgery  Acute right-sided low back pain without sciatica - Plan: Ambulatory referral to Orthopedic Surgery, cyclobenzaprine (FLEXERIL) 10 MG tablet  Strain of lumbar region, initial encounter  Here today with a left knee problem,  It sounds like she may have an internal derangement of some sort Cannot have MRI due to her cochlear implant Refer back to Dr. Charlann Boxerlin to look at her knee. For the time being avoid painful exercise such as running, and use an OTC knee sleeve or brace as needed.  Ice as needed Right lower back pain- SI joint or muscle strain.  Gave rx for flexeril to use as needed- can cause drowsiness, pt alerted   She will let me know if she needs anything or if the back pain does not resolve  Signed Abbe AmsterdamJessica Copland, MD

## 2017-10-31 ENCOUNTER — Ambulatory Visit: Payer: BLUE CROSS/BLUE SHIELD | Admitting: Family Medicine

## 2017-10-31 ENCOUNTER — Encounter: Payer: Self-pay | Admitting: Family Medicine

## 2017-10-31 VITALS — BP 102/70 | HR 88 | Temp 97.9°F | Ht 65.0 in | Wt 177.8 lb

## 2017-10-31 DIAGNOSIS — M2352 Chronic instability of knee, left knee: Secondary | ICD-10-CM

## 2017-10-31 DIAGNOSIS — K120 Recurrent oral aphthae: Secondary | ICD-10-CM

## 2017-10-31 DIAGNOSIS — M25562 Pain in left knee: Secondary | ICD-10-CM

## 2017-10-31 DIAGNOSIS — S39012A Strain of muscle, fascia and tendon of lower back, initial encounter: Secondary | ICD-10-CM | POA: Diagnosis not present

## 2017-10-31 DIAGNOSIS — M545 Low back pain, unspecified: Secondary | ICD-10-CM

## 2017-10-31 DIAGNOSIS — G8929 Other chronic pain: Secondary | ICD-10-CM | POA: Diagnosis not present

## 2017-10-31 MED ORDER — TRIAMCINOLONE ACETONIDE 0.1 % MT PSTE: PASTE | OROMUCOSAL | 2 refills | Status: DC

## 2017-10-31 MED ORDER — CYCLOBENZAPRINE HCL 10 MG PO TABS: ORAL_TABLET | ORAL | 0 refills | Status: DC

## 2017-10-31 NOTE — Patient Instructions (Addendum)
It was good to see you today!   I am going to set you up to see Dr. Charlann Boxerlin again about your knee.  In the meantime you can continue to use Aleve- an OTC knee brace or sleeve may also be helpful.  Let me know if any worsening or other concerns I gave you an rx for flexeril to use as needed for your lower back pain.  This will make you sleepy, so plan to use it at night.  Heat, gentle stretching can also be helpful Let me know if this is not getting better or if it is getting worse   I gave you an rx for the triamcinolone you can use for your cold sores

## 2017-11-04 ENCOUNTER — Ambulatory Visit: Payer: BLUE CROSS/BLUE SHIELD | Admitting: Licensed Clinical Social Worker

## 2017-11-07 ENCOUNTER — Telehealth (HOSPITAL_COMMUNITY): Payer: Self-pay

## 2017-11-07 NOTE — Telephone Encounter (Signed)
Medication refill request - [facility] a refill of Prozac 10 mg, last provided 10/07/17 as appears pt. never increased to 20 mg.  Patient does not return until 01/01/17.

## 2017-11-08 ENCOUNTER — Other Ambulatory Visit (HOSPITAL_COMMUNITY): Payer: Self-pay | Admitting: Psychiatry

## 2017-11-08 ENCOUNTER — Ambulatory Visit (INDEPENDENT_AMBULATORY_CARE_PROVIDER_SITE_OTHER): Payer: BLUE CROSS/BLUE SHIELD | Admitting: Licensed Clinical Social Worker

## 2017-11-08 DIAGNOSIS — F3341 Major depressive disorder, recurrent, in partial remission: Secondary | ICD-10-CM

## 2017-11-08 MED ORDER — FLUOXETINE HCL 20 MG PO CAPS: 20.0000 mg | ORAL_CAPSULE | Freq: Every day | ORAL | 1 refills | Status: DC

## 2017-11-08 NOTE — Telephone Encounter (Signed)
All set, I sent over 20 mg capsules 90 days + 1 refill, she has follow-up with me in February.

## 2017-11-18 DIAGNOSIS — F3340 Major depressive disorder, recurrent, in remission, unspecified: Secondary | ICD-10-CM | POA: Diagnosis not present

## 2017-11-26 NOTE — Progress Notes (Signed)
[facility] at [facility] [address] [place], [place] [address] [phone]  Date:  11/28/2017   Name:  Patricia Morse   DOB:  [birth year]   MRN:  [id number]  PCP:  Pearline Cables, MD    Chief Complaint: Ingrown hairs (c/o ingrown hairs in bikini area, worried about possible infection. Shaved two weeks ago. )   History of Present Illness:  Patricia Morse is a 32 y.o. very pleasant female patient who presents with the following:  Here today with concern of ingrown hairs, ?infection She shaved her bikini area 2 weeks ago and onset of irritation a couple of days later.  She got concerned as she has developed several pustules and soreness, and was worried that she might get a more serious injfection She is otherwise feeling ok- no fever or chills  She is on OCP- used her OCP continuously to skip her last menses so her LMP was in November Except for hearing loss and overweight she is generally in good health  Patient Active Problem List   Diagnosis Date Noted  . Visual disturbance 04/25/2016  . Dizziness 04/25/2016  . Obesity, unspecified 06/08/2013  . Depression 10/10/2012  . Hearing loss 10/10/2012    Past Medical History:  Diagnosis Date  . Allergy   . Auditory neuropathy BILATERAL  . Bilateral sensorineural hearing loss    SECONDARY AUDITORY NEUROPATHY  . 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 Depression   . 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 Internal derangement of right knee   . PONV (postoperative nausea and vomiting) POSTOP COCHLEAR  IMPLANT 2006  . Seasonal allergies     Past Surgical History:  Procedure Laterality Date  . BILATERAL MIDDLE EAR EXPLORATION/ SEALING OF OVAL AND ROUND WINDOW FISTULAS  1999 (AGE 44)   PERILYMPH FISTULAS  . CHONDROPLASTY  03/13/2012   Procedure: CHONDROPLASTY;  Surgeon: Shelda Pal, MD;  Location: [facility];  Service: Orthopedics;  Laterality: Right;  . COCHLEAR IMPLANT  11-15-2005  (AGE 39)   LEFT EAR  (CLARION HIGH-RESOLUTION 90K MULTICHANNEL,  1-J ELECTRODE)  . KNEE ARTHROSCOPY  03/13/2012   Procedure: ARTHROSCOPY KNEE;  Surgeon: Shelda Pal, MD;  Location: [facility];  Service: Orthopedics;  Laterality: Right;  RIGHT KNEE DIAGNOSTIC AND OPERATIVE SCOPE  . WISDOM TOOTH EXTRACTION  AGE 30   DENTAL OFFICE    Social History   Tobacco Use  . Smoking status: Former Smoker    Types: Cigarettes    Last attempt to quit: 03/10/2008    Years since quitting: 9.7  . Smokeless tobacco: Never Used  . Tobacco comment: SOCIAL SMOKER IN COLLEGE FOR 64YRS -- NO SMOKING SINCE  Substance Use Topics  . Alcohol use: Yes    Alcohol/week: 1.8 - 3.0 oz    Types: 3 - 5 Glasses of wine per week    Comment: OCCASIONAL   . Drug use: No    Family History  Problem Relation Age of Onset  . Depression Mother   . Hypertension Mother   . Mental illness Mother   . Anxiety disorder Maternal Grandmother     Allergies  Allergen Reactions  . Amoxicillin Hives  . Tdap [Diphth-Acell Pertussis-Tetanus]   . Hydrocodeine [Dihydrocodeine] Rash  . Penicillins Rash    Medication list has been reviewed and updated.  Current Outpatient Medications  on File Prior to Visit  Medication Sig Dispense Refill  . APRI 0.15-30 MG-MCG tablet   5  . cetirizine (ZYRTEC) 10 MG tablet Take 10 mg by mouth daily.    . cyclobenzaprine (FLEXERIL) 10 MG tablet Take 1/2 or 1 at bedtime as needed for back pain 20 tablet 0  . FLUoxetine (PROZAC) 20 MG capsule Take 1 capsule (20 mg total) by mouth daily. 90 capsule 1  . Probiotic Product (PROBIOTIC DAILY PO) Take by mouth.    . triamcinolone (KENALOG) 0.1 % paste Apply to canker sore twice a day as needed 5 g 2  . valACYclovir (VALTREX) 1000 MG tablet TAKE 2 TABLETS TWICE A DAY FOR 2 DOSES FOR COLD SORES 30 tablet 7   No current facility-administered medications  on file prior to visit.     Review of Systems:  As per HPI- otherwise negative. No fever or chills No nausea or vomiting No other skin concerns today   Physical Examination: Vitals:   11/28/17 1323  BP: 132/88  Pulse: (!) 107  Temp: 98.3 F (36.8 C)  SpO2: 99%   Vitals:   11/28/17 1323  Weight: 179 lb (81.2 kg)  Height: 5\' 5"  (1.651 m)   Body mass index is 29.79 kg/m. Ideal Body Weight: Weight in (lb) to have BMI = 25: 149.9  GEN: WDWN, NAD, Non-toxic, A & O x 3, overweight, looks well today HEENT: Atraumatic, Normocephalic. Neck supple. No masses, No LAD.  Bilateral TM wnl, oropharynx normal.  PEERL,EOMI.   Ears and Nose: No external deformity. CV: RRR, No M/G/R. No JVD. No thrill. No extra heart sounds. PULM: CTA B, no wheezes, crackles, rhonchi. No retractions. No resp. distress. No accessory muscle use. EXTR: No c/c/e NEURO Normal gait.  PSYCH: Normally interactive. Conversant. Not depressed or anxious appearing.  Calm demeanor.  Frontal bikini area shows folliculitis in various stages of activity in the hair bearing skin.  Does not appear to be vesicular (not herpes)   Assessment and Plan: Folliculitis - Plan: doxycycline (VIBRAMYCIN) 100 MG capsule  Treat for folliculitis with doxycycline gave 10 days but she can stop after 5-7 days if doing well. Cautioned about possible interference with her OCP She will let me know if not responding well to treatment- Sooner if worse.    Signed Abbe AmsterdamJessica Tiarah Shisler, MD

## 2017-11-27 ENCOUNTER — Ambulatory Visit (INDEPENDENT_AMBULATORY_CARE_PROVIDER_SITE_OTHER): Payer: BLUE CROSS/BLUE SHIELD | Admitting: Licensed Clinical Social Worker

## 2017-11-27 DIAGNOSIS — F3341 Major depressive disorder, recurrent, in partial remission: Secondary | ICD-10-CM

## 2017-11-28 ENCOUNTER — Encounter: Payer: Self-pay | Admitting: Family Medicine

## 2017-11-28 ENCOUNTER — Ambulatory Visit: Payer: BLUE CROSS/BLUE SHIELD | Admitting: Family Medicine

## 2017-11-28 VITALS — BP 132/88 | HR 84 | Temp 98.3°F | Ht 65.0 in | Wt 179.0 lb

## 2017-11-28 DIAGNOSIS — L739 Follicular disorder, unspecified: Secondary | ICD-10-CM

## 2017-11-28 MED ORDER — DOXYCYCLINE HYCLATE 100 MG PO CAPS: 100.0000 mg | ORAL_CAPSULE | Freq: Two times a day (BID) | ORAL | 0 refills | Status: DC

## 2017-11-28 NOTE — Patient Instructions (Signed)
You have a superficial infection of the hair follicles.   I think this will do well with oral doxycycline, but keep a close watch on how you are doing . If your skin is not healing in the next couple of days please alert me- Sooner if worse.

## 2017-12-16 ENCOUNTER — Ambulatory Visit (INDEPENDENT_AMBULATORY_CARE_PROVIDER_SITE_OTHER): Payer: BLUE CROSS/BLUE SHIELD | Admitting: Licensed Clinical Social Worker

## 2017-12-16 DIAGNOSIS — F3341 Major depressive disorder, recurrent, in partial remission: Secondary | ICD-10-CM

## 2017-12-30 ENCOUNTER — Ambulatory Visit: Payer: BLUE CROSS/BLUE SHIELD | Admitting: Licensed Clinical Social Worker

## 2018-01-01 ENCOUNTER — Encounter (HOSPITAL_COMMUNITY): Payer: Self-pay | Admitting: Psychiatry

## 2018-01-01 ENCOUNTER — Ambulatory Visit (INDEPENDENT_AMBULATORY_CARE_PROVIDER_SITE_OTHER): Payer: BLUE CROSS/BLUE SHIELD | Admitting: Psychiatry

## 2018-01-01 DIAGNOSIS — F410 Panic disorder [episodic paroxysmal anxiety] without agoraphobia: Secondary | ICD-10-CM | POA: Diagnosis not present

## 2018-01-01 DIAGNOSIS — Z818 Family history of other mental and behavioral disorders: Secondary | ICD-10-CM

## 2018-01-01 DIAGNOSIS — Z87891 Personal history of nicotine dependence: Secondary | ICD-10-CM | POA: Diagnosis not present

## 2018-01-01 DIAGNOSIS — F331 Major depressive disorder, recurrent, moderate: Secondary | ICD-10-CM | POA: Diagnosis not present

## 2018-01-01 DIAGNOSIS — F1099 Alcohol use, unspecified with unspecified alcohol-induced disorder: Secondary | ICD-10-CM | POA: Diagnosis not present

## 2018-01-01 MED ORDER — FLUOXETINE HCL 40 MG PO CAPS: 40.0000 mg | ORAL_CAPSULE | Freq: Every day | ORAL | 1 refills | Status: DC

## 2018-01-01 NOTE — Progress Notes (Signed)
BH MD/PA/NP OP Progress Note  01/01/2018 11:20 AM Patricia Morse  MRN:  [id number]  Chief Complaint:  Med check HPI: Patient reports that after our last meeting, she tried going off of medicines for 2 weeks, but realized that she did better with SSRI.  She was having panic attacks and worsening depression.  As per instructions she titrated Prozac to 20 mg daily, and reports that the panic attacks were resolved fairly quickly, and her mood and anxiety have improved over the past 6-8 weeks.  She does continue to have some breakthrough anxiety and depressive symptoms and wonders about increasing to 40 mg.  She has not had any sexual side effects, GI side effects, or insomnia.  We discussed increasing to 40 mg and encouraged her to continue working on coping strategies.  She will follow-up with writer in 2-3 months or sooner if needed.  Visit Diagnosis:    ICD-10-CM   1. Moderate episode of recurrent major depressive disorder (HCC) F33.1 FLUoxetine (PROZAC) 40 MG capsule    Past Psychiatric History: See intake H&P for full details. Reviewed, with no updates at this time.   Past Medical History:  Past Medical History:  Diagnosis Date  . Allergy   . Auditory neuropathy BILATERAL  . Bilateral sensorineural hearing loss    SECONDARY AUDITORY NEUROPATHY  . 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 Depression   . 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 Internal derangement of right knee   . PONV (postoperative nausea and vomiting) POSTOP COCHLEAR  IMPLANT 2006  . Seasonal allergies     Past Surgical History:  Procedure Laterality Date  . BILATERAL MIDDLE EAR EXPLORATION/ SEALING OF OVAL AND ROUND WINDOW FISTULAS  1999 (AGE 75)   PERILYMPH FISTULAS  . CHONDROPLASTY  03/13/2012   Procedure: CHONDROPLASTY;  Surgeon: Shelda Pal, MD;  Location[facility];  Service: Orthopedics;  Laterality: Right;  . COCHLEAR IMPLANT   11-15-2005  (AGE 37)   LEFT EAR  (CLARION HIGH-RESOLUTION 90K MULTICHANNEL,  1-J ELECTRODE)  . KNEE ARTHROSCOPY  03/13/2012   Procedure: ARTHROSCOPY KNEE;  Surgeon: Shelda Pal, MD;  [facility];  Service: Orthopedics;  Laterality: Right;  RIGHT KNEE DIAGNOSTIC AND OPERATIVE SCOPE  . WISDOM TOOTH EXTRACTION  AGE 57   DENTAL OFFICE    Family Psychiatric History: See intake H&P for full details. Reviewed, with no updates at this time.   Family History:  Family History  Problem Relation Age of Onset  . Depression Mother   . Hypertension Mother   . Mental illness Mother   . Anxiety disorder Maternal Grandmother     Social History:  Social History   Socioeconomic History  . Marital status: Single    Spouse name: Not on file  . Number of children: Not on file  . Years of education: Not on file  . Highest education level: Not on file  Social Needs  . Financial resource strain: Not on file  . Food insecurity - worry: Not on file  . Food insecurity - inability: Not on file  . Transportation needs - medical: Not on file  . Transportation needs - non-medical: Not on file  Occupational History  . Occupation: Doctor, hospital for [employer]    Employer: [employer]  Tobacco Use  . Smoking status: Former Smoker    Types: Cigarettes    Last attempt to quit:  03/10/2008    Years since quitting: 9.8  . Smokeless tobacco: Never Used  . Tobacco comment: SOCIAL SMOKER IN COLLEGE FOR 65YRS -- NO SMOKING SINCE  Substance and Sexual Activity  . Alcohol use: Yes    Alcohol/week: 1.8 - 3.0 oz    Types: 3 - 5 Glasses of wine per week    Comment: OCCASIONAL   . Drug use: No  . Sexual activity: Yes    Partners: Male    Birth control/protection: Pill  Other Topics Concern  . Not on file  Social History Narrative  . Not on file    Allergies:  Allergies  Allergen Reactions  . Amoxicillin Hives  . Tdap [Diphth-Acell Pertussis-Tetanus]   . Hydrocodeine  [Dihydrocodeine] Rash  . Penicillins Rash    Metabolic Disorder Labs: Lab Results  Component Value Date   HGBA1C 5.1 06/10/2017   No results found for: PROLACTIN Lab Results  Component Value Date   CHOL 185 06/10/2017   TRIG 117.0 06/10/2017   HDL 48.50 06/10/2017   CHOLHDL 4 06/10/2017   VLDL 23.4 06/10/2017   LDLCALC 113 (H) 06/10/2017   LDLCALC 113 04/25/2016   Lab Results  Component Value Date   TSH 1.17 10/10/2016   TSH 1.499 11/04/2013    Therapeutic Level Labs: No results found for: LITHIUM No results found for: VALPROATE No components found for:  CBMZ  Current Medications: Current Outpatient Medications  Medication Sig Dispense Refill  . APRI 0.15-30 MG-MCG tablet   5  . cetirizine (ZYRTEC) 10 MG tablet Take 10 mg by mouth daily.    . cyclobenzaprine (FLEXERIL) 10 MG tablet Take 1/2 or 1 at bedtime as needed for back pain 20 tablet 0  . doxycycline (VIBRAMYCIN) 100 MG capsule Take 1 capsule (100 mg total) by mouth 2 (two) times daily. 20 capsule 0  . FLUoxetine (PROZAC) 40 MG capsule Take 1 capsule (40 mg total) by mouth daily. 90 capsule 1  . Probiotic Product (PROBIOTIC DAILY PO) Take by mouth.    . triamcinolone (KENALOG) 0.1 % paste Apply to canker sore twice a day as needed 5 g 2  . valACYclovir (VALTREX) 1000 MG tablet TAKE 2 TABLETS TWICE A DAY FOR 2 DOSES FOR COLD SORES 30 tablet 7   No current facility-administered medications for this visit.      Musculoskeletal: Strength & Muscle Tone: within normal limits Gait & Station: normal Patient leans: N/A  Psychiatric Specialty Exam: ROS  There were no vitals taken for this visit.There is no height or weight on file to calculate BMI.  General Appearance: Casual and Fairly Groomed  Eye Contact:  Good  Speech:  Clear and Coherent and Normal Rate  Volume:  Normal  Mood:  Euthymic  Affect:  Appropriate and Congruent  Thought Process:  Goal Directed and Descriptions of Associations: Intact   Orientation:  Full (Time, Place, and Person)  Thought Content: Logical   Suicidal Thoughts:  No  Homicidal Thoughts:  No  Memory:  Immediate;   Good  Judgement:  Fair  Insight:  Fair  Psychomotor Activity:  Normal  Concentration:  Attention Span: Good  Recall:  Good  Fund of Knowledge: Good  Language: Good  Akathisia:  Negative  Handed:  Right  AIMS (if indicated): not done  Assets:  Communication Skills Desire for Improvement Financial Resources/Insurance Housing Intimacy Social Support Transportation Vocational/Educational  ADL's:  Intact  Cognition: WNL  Sleep:  Good   Screenings: PHQ2-9     Office Visit  [facility] Office Visit from 11[facility]16[facility]2016 [facility] from [facility]/25[facility]06[facility]  PHQ-2 Total Score  0  0  0  0  0       Assessment and Plan:  Sharman CrateWhitley Shumaker presents for med management follow-up.  She has tolerated Prozac well with interval improvement in mood and anxiety.  Would benefit from increase to 40 mg.  She has not had any sexual side effects or other intolerance, and fluoxetine seems to agree with her where as she had failed other SSRIs due to side effects or intolerance or lack of benefit.  1. Moderate episode of recurrent major depressive disorder (HCC)     Status of current problems: gradually improving  Labs Ordered: No orders of the defined types were placed in this encounter.   Labs Reviewed: n/a  Collateral Obtained/Records Reviewed: n/a  Plan:  Increase Prozac to 40 mg daily  I spent 20 minutes with the patient in direct face-to-face clinical care.  Greater than 50% of this time was spent in counseling and coordination of care with the patient.    Burnard LeighAlexander Arya Marque Rademaker, MD 01/01/2018, 11:20 AM

## 2018-01-18 ENCOUNTER — Encounter (HOSPITAL_COMMUNITY): Payer: Self-pay | Admitting: Psychiatry

## 2018-01-21 ENCOUNTER — Ambulatory Visit: Payer: BLUE CROSS/BLUE SHIELD | Admitting: Licensed Clinical Social Worker

## 2018-02-04 ENCOUNTER — Ambulatory Visit (INDEPENDENT_AMBULATORY_CARE_PROVIDER_SITE_OTHER): Payer: BLUE CROSS/BLUE SHIELD | Admitting: Licensed Clinical Social Worker

## 2018-02-04 DIAGNOSIS — F3341 Major depressive disorder, recurrent, in partial remission: Secondary | ICD-10-CM | POA: Diagnosis not present

## 2018-02-04 DIAGNOSIS — Z01419 Encounter for gynecological examination (general) (routine) without abnormal findings: Secondary | ICD-10-CM | POA: Diagnosis not present

## 2018-02-04 DIAGNOSIS — Z6829 Body mass index (BMI) 29.0-29.9, adult: Secondary | ICD-10-CM | POA: Diagnosis not present

## 2018-02-04 DIAGNOSIS — Z1151 Encounter for screening for human papillomavirus (HPV): Secondary | ICD-10-CM | POA: Diagnosis not present

## 2018-02-18 ENCOUNTER — Encounter: Payer: Self-pay | Admitting: Family Medicine

## 2018-02-18 ENCOUNTER — Ambulatory Visit (INDEPENDENT_AMBULATORY_CARE_PROVIDER_SITE_OTHER): Payer: BLUE CROSS/BLUE SHIELD | Admitting: Licensed Clinical Social Worker

## 2018-02-18 DIAGNOSIS — F3341 Major depressive disorder, recurrent, in partial remission: Secondary | ICD-10-CM | POA: Diagnosis not present

## 2018-03-04 ENCOUNTER — Encounter (HOSPITAL_COMMUNITY): Payer: Self-pay | Admitting: Psychiatry

## 2018-03-04 ENCOUNTER — Ambulatory Visit (HOSPITAL_COMMUNITY): Payer: BLUE CROSS/BLUE SHIELD | Admitting: Psychiatry

## 2018-03-04 DIAGNOSIS — Z87891 Personal history of nicotine dependence: Secondary | ICD-10-CM | POA: Diagnosis not present

## 2018-03-04 DIAGNOSIS — Z818 Family history of other mental and behavioral disorders: Secondary | ICD-10-CM | POA: Diagnosis not present

## 2018-03-04 DIAGNOSIS — F331 Major depressive disorder, recurrent, moderate: Secondary | ICD-10-CM | POA: Diagnosis not present

## 2018-03-04 MED ORDER — FLUOXETINE HCL 40 MG PO CAPS: 40.0000 mg | ORAL_CAPSULE | Freq: Every day | ORAL | 1 refills | Status: DC

## 2018-03-04 NOTE — Progress Notes (Signed)
BH MD/PA/NP OP Progress Note  03/04/2018 11:57 AM Patricia Morse  MRN:  [id number]  Chief Complaint: doing well HPI: Patricia Morse reports that she is overall doing much better.  She is glad that we waited before adding a different medicine, because the Prozac really started to kick in a few weeks later.  She reports that her depression and anxiety are under good stable controlled level.  She has received very positive feedback from her boyfriend.  Spent time discussing pregnancy planning and given her history of severe recurrent depression I recommended she stay on the current dose of 40 mg.  Reviewed the risks and benefits, and I educated patient that there is very limited data to indicate any deleterious effects of Prozac in terms of in utero exposure to the baby..  She is not currently expecting, but was just asking in anticipation that the next 1-2 years, her and her boyfriend may get engaged and start trying to have children.   No significant safety concerns, physical side effects, reports that her sexual function has been good.  Sleeping well at night.  Reports that her job stressors are easier to manage now on the current dose of Prozac.  Visit Diagnosis:    ICD-10-CM   1. Moderate episode of recurrent major depressive disorder (HCC) F33.1 FLUoxetine (PROZAC) 40 MG capsule    Past Psychiatric History: See intake H&P for full details. Reviewed, with no updates at this time.   Past Medical History:  Past Medical History:  Diagnosis Date  . Allergy   . Auditory neuropathy BILATERAL  . Bilateral sensorineural hearing loss    SECONDARY AUDITORY NEUROPATHY  . 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 Depression   . 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 Internal derangement of right knee   . PONV (postoperative nausea and vomiting) POSTOP COCHLEAR  IMPLANT 2006  . Seasonal allergies     Past Surgical History:  Procedure  Laterality Date  . BILATERAL MIDDLE EAR EXPLORATION/ SEALING OF OVAL AND ROUND WINDOW FISTULAS  1999 (AGE 45)   PERILYMPH FISTULAS  . CHONDROPLASTY  03/13/2012   Procedure: CHONDROPLASTY;  Surgeon: Shelda PalMatthew D Olin, MD;  [facility];  Service: Orthopedics;  Laterality: Right;  . COCHLEAR IMPLANT  11-15-2005  (AGE 56)   LEFT EAR  (CLARION HIGH-RESOLUTION 90K MULTICHANNEL,  1-J ELECTRODE)  . KNEE ARTHROSCOPY  03/13/2012   Procedure: ARTHROSCOPY KNEE;  Surgeon: Shelda PalMatthew D Olin, MD;  [facility];  Service: Orthopedics;  Laterality: Right;  RIGHT KNEE DIAGNOSTIC AND OPERATIVE SCOPE  . WISDOM TOOTH EXTRACTION  AGE 62   DENTAL OFFICE    Family Psychiatric History: See intake H&P for full details. Reviewed, with no updates at this time.   Family History:  Family History  Problem Relation Age of Onset  . Depression Mother   . Hypertension Mother   . Mental illness Mother   . Anxiety disorder Maternal Grandmother     Social History:  Social History   Socioeconomic History  . Marital status: Single    Spouse name: Not on file  . Number of children: Not on file  . Years of education: Not on file  . Highest education level: Not on file  Occupational History  . 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 Financial resource strain: Not on file  .  Food insecurity:    Worry: Not on file    Inability: Not on file  . Transportation needs:    Medical: Not on file    Non-medical: Not on file  Tobacco Use  . Smoking status: Former Smoker    Types: Cigarettes    Last attempt to quit: 03/10/2008    Years since quitting: 9.9  . Smokeless tobacco: Never Used  . Tobacco comment: SOCIAL SMOKER IN COLLEGE FOR 39YRS -- NO SMOKING SINCE  Substance and Sexual Activity  . Alcohol use: Yes    Alcohol/week: 1.8 - 3.0 oz    Types: 3 - 5 Glasses of wine per week    Comment: OCCASIONAL   . Drug use: No  .  Sexual activity: Yes    Partners: Male    Birth control/protection: Pill  Lifestyle  . Physical activity:    Days per week: Not on file    Minutes per session: Not on file  . Stress: Not on file  Relationships  . Social connections:    Talks on phone: Not on file    Gets together: Not on file    Attends religious service: Not on file    Active member of club or organization: Not on file    Attends meetings of clubs or organizations: Not on file    Relationship status: Not on file  Other Topics Concern  . Not on file  Social History Narrative  . Not on file    Allergies:  Allergies  Allergen Reactions  . Amoxicillin Hives  . Tdap [Tetanus-Diphth-Acell Pertussis]   . Hydrocodeine [Dihydrocodeine] Rash  . Penicillins Rash    Metabolic Disorder Labs: Lab Results  Component Value Date   HGBA1C 5.1 06/10/2017   No results found for: PROLACTIN Lab Results  Component Value Date   CHOL 185 06/10/2017   TRIG 117.0 06/10/2017   HDL 48.50 06/10/2017   CHOLHDL 4 06/10/2017   VLDL 23.4 06/10/2017   LDLCALC 113 (H) 06/10/2017   LDLCALC 113 04/25/2016   Lab Results  Component Value Date   TSH 1.17 10/10/2016   TSH 1.499 11/04/2013    Therapeutic Level Labs: No results found for: LITHIUM No results found for: VALPROATE No components found for:  CBMZ  Current Medications: Current Outpatient Medications  Medication Sig Dispense Refill  . APRI 0.15-30 MG-MCG tablet   5  . cetirizine (ZYRTEC) 10 MG tablet Take 10 mg by mouth daily.    Marland Kitchen FLUoxetine (PROZAC) 40 MG capsule Take 1 capsule (40 mg total) by mouth daily. 90 capsule 1  . fluticasone (FLONASE) 50 MCG/ACT nasal spray Place 1 spray into both nostrils daily.    . Multiple Vitamins-Minerals (MULTIVITAMIN WITH MINERALS) tablet Take 1 tablet by mouth daily.    . Probiotic Product (PROBIOTIC DAILY PO) Take by mouth.    . triamcinolone (KENALOG) 0.1 % paste Apply to canker sore twice a day as needed 5 g 2  .  valACYclovir (VALTREX) 1000 MG tablet TAKE 2 TABLETS TWICE A DAY FOR 2 DOSES FOR COLD SORES 30 tablet 7  . cyclobenzaprine (FLEXERIL) 10 MG tablet Take 1/2 or 1 at bedtime as needed for back pain (Patient not taking: Reported on 03/04/2018) 20 tablet 0  . doxycycline (VIBRAMYCIN) 100 MG capsule Take 1 capsule (100 mg total) by mouth 2 (two) times daily. (Patient not taking: Reported on 03/04/2018) 20 capsule 0   No current facility-administered medications for this visit.      Musculoskeletal: Strength &  Muscle Tone: within normal limits Gait & Station: normal Patient leans: N/A  Psychiatric Specialty Exam: ROS  Blood pressure 118/80, pulse 84, height 5\' 5"  (1.651 m), weight 180 lb (81.6 kg).Body mass index is 29.95 kg/m.  General Appearance: Casual and Well Groomed  Eye Contact:  Fair  Speech:  Normal Rate  Volume:  baseline is generally loud 2/2 to hearing loss  Mood:  Euthymic  Affect:  Appropriate and Congruent  Thought Process:  Goal Directed and Descriptions of Associations: Intact  Orientation:  Full (Time, Place, and Person)  Thought Content: Logical   Suicidal Thoughts:  No  Homicidal Thoughts:  No  Memory:  Immediate;   Good  Judgement:  Fair  Insight:  Fair  Psychomotor Activity:  Normal  Concentration:  Concentration: Good  Recall:  Good  Fund of Knowledge: Good  Language: Good  Akathisia:  Negative  Handed:  Right  AIMS (if indicated): not done  Assets:  Communication Skills Desire for Improvement Financial Resources/Insurance Housing Intimacy Social Support Transportation Vocational/Educational  ADL's:  Intact  Cognition: WNL  Sleep:  Good   Screenings: PHQ2-9     Office Visit from 03/25/2017 in Primary Care at [facility] Office Visit from 11/16/2016 in Primary Care at [facility] Visit from 04/25/2016 in Primary Care at [facility] Visit from 03/06/2016 in Primary Care at [facility] Visit from 12/15/2015 in Primary Care at [facility]  PHQ-2 Total Score  0   0  0  0  0       Assessment and Plan:  Kensleigh Gates presents with improvement in stable depressive symptoms, anxiety is easily manageable, and she is participating in individual therapy on a more regular basis.  Prozac 40 mg has been tolerated well and is providing her ongoing benefit.  No sexual dysfunction or other deleterious effects.  Spent time today providing her education on peripartum planning if and when she and her partner choose to have children.  We will follow-up in 6 months or sooner if needed.  1. Moderate episode of recurrent major depressive disorder (HCC)     Status of current problems: stable  Labs Ordered: No orders of the defined types were placed in this encounter.   Labs Reviewed: n/a  Collateral Obtained/Records Reviewed: n/a  Plan:  prozac 40 mg daily rtc 6 months  I spent 20 minutes with the patient in direct face-to-face clinical care.  Greater than 50% of this time was spent in counseling and coordination of care with the patient.    Burnard Leigh, MD 03/04/2018, 11:57 AM

## 2018-03-05 ENCOUNTER — Encounter: Payer: Self-pay | Admitting: Family Medicine

## 2018-03-05 MED ORDER — OLOPATADINE HCL 0.2 % OP SOLN: OPHTHALMIC | 6 refills | Status: DC

## 2018-03-06 ENCOUNTER — Ambulatory Visit (INDEPENDENT_AMBULATORY_CARE_PROVIDER_SITE_OTHER): Payer: BLUE CROSS/BLUE SHIELD | Admitting: Physician Assistant

## 2018-03-06 ENCOUNTER — Encounter: Payer: Self-pay | Admitting: Physician Assistant

## 2018-03-06 ENCOUNTER — Other Ambulatory Visit: Payer: Self-pay

## 2018-03-06 VITALS — BP 118/78 | HR 98 | Temp 98.5°F | Resp 18 | Ht 65.75 in | Wt 177.8 lb

## 2018-03-06 DIAGNOSIS — H1010 Acute atopic conjunctivitis, unspecified eye: Secondary | ICD-10-CM

## 2018-03-06 NOTE — Progress Notes (Signed)
MRN: [id number] DOB: 1986-05-04  Subjective:   Patricia Morse is a 32 y.o. female presenting for chief complaint of Rash (X 2- 3 days) and Eye Problem (X 2-3 days -pt states itchinh eyes) . Patient has history of severe seasonal allergies during this time of year.  Notes she was outside all day a few days ago and had a very bad reaction to the pollen.  She had itchy watery red eyes with surrounding swelling, runny nose, and sneezing. She used Benadryl and after a couple days it worked.  She continues to take Zyrtec and Flonase daily.  She contacted her primary care doctor via my chart and she sent her in a prescription for Pataday drops.  She has not been able to pick them up yet.She is feeling much better today.  She was going to cancel her appointment but figured she would get charges she came in [place].  Her swelling has resolved.Denies purulent discharge, visual disturbance, foreign body sensation, photophobia, fever, chills, sinus pain, cough, shortness of breath, and wheezing.  She does wear contacts but has been wearing her eyeglasses since the event. No history of asthma.  Denies any other aggravating or relieving factors, no other questions or concerns.  Patricia Morse has a current medication list which includes the following prescription(s): apri, cetirizine, fluoxetine, fluticasone, multivitamin with minerals, olopatadine hcl, probiotic product, triamcinolone, valacyclovir, cyclobenzaprine, and doxycycline. Also is allergic to amoxicillin; tdap [tetanus-diphth-acell pertussis]; hydrocodeine [dihydrocodeine]; and penicillins.  Patricia Morse  has a past medical history of Allergy, Auditory neuropathy (BILATERAL), Bilateral sensorineural hearing loss, Cochlear implant in place (11-15-2005), Depression, History of viral encephalitis (AS BABY), Internal derangement of right knee, PONV (postoperative nausea and vomiting) (POSTOP COCHLEAR  IMPLANT 2006), and Seasonal allergies. Also  has a past surgical  history that includes Cochlear implant (11-15-2005  (AGE 57)); BILATERAL MIDDLE EAR EXPLORATION/ SEALING OF OVAL AND ROUND WINDOW FISTULAS (1999 (AGE 5)); Wisdom tooth extraction (AGE 34); Knee arthroscopy (03/13/2012); and Chondroplasty (03/13/2012).   Objective:   Vitals: BP 118/78 (BP Location: Left Arm, Patient Position: Sitting, Cuff Size: Normal)   Pulse 98   Temp 98.5 F (36.9 C) (Oral)   Resp 18   Ht 5' 5.75" (1.67 m)   Wt 177 lb 12.8 oz (80.6 kg)   LMP 02/20/2018 (Exact Date)   SpO2 99%   BMI 28.92 kg/m   Physical Exam  Constitutional: She is oriented to person, place, and time. She appears well-developed and well-nourished. No distress.  HENT:  Head: Normocephalic and atraumatic. Head is without right periorbital erythema and without left periorbital erythema.  Right Ear: Tympanic membrane, external ear and ear canal normal.  Nose: Mucosal edema (pale boggy turbinates bilaterally) present.  Mouth/Throat: Uvula is midline, oropharynx is clear and moist and mucous membranes are normal. No tonsillar exudate.  Left ear with cochlear implant  No periorbital edema noted.  Eyes: Pupils are equal, round, and reactive to light. EOM are normal. Right eye exhibits no discharge. Left eye exhibits no discharge. Right conjunctiva is injected (medial conjunctiva with mild injection). Left conjunctiva is not injected.  Neck: Normal range of motion.  Pulmonary/Chest: Effort normal.  Neurological: She is alert and oriented to person, place, and time.  Skin: Skin is warm and dry.  Psychiatric: She has a normal mood and affect.  Vitals reviewed.   Visual Acuity Screening   Right eye Left eye Both eyes  Without correction:     With correction: 20/40 20/40 20/40    No results  found for this or any previous visit (from the past 24 hour(s)).  Assessment and Plan :  1. Seasonal allergic conjunctivitis Improved.  Physical exam findings are reassuring.  Recommend she pick up the eyedrops  that were sent in from her PCP.  Continue with Zyrtec and Flonase daily.  Follow-up with PCP as needed.  Benjiman CoreBrittany Sesar [place], PA-C  Primary [facility] at [facility] 03/06/2018 12:58 PM

## 2018-03-06 NOTE — Patient Instructions (Addendum)
Continue with zyrtec and flonase.   Pick up eye drops and use as directed.   In the future, if your insurance does not cover the eye drops you can buy over the counter opchon-A.  Thank you for letting me participate in your health and well being.   IF you received an x-ray today, you will receive an invoice from [facility]. Please contact [facility] at [phone] with questions or concerns regarding your invoice.   IF you received labwork today, you will receive an invoice from [facility]. Please contact [facility] at [phone] with questions or concerns regarding your invoice.   Our billing staff will not be able to assist you with questions regarding bills from these companies.  You will be contacted with the lab results as soon as they are available. The fastest way to get your results is to activate your My Chart account. Instructions are located on the last page of this paperwork. If you have not heard from us regarding the results in 2 weeks, please contact this office.

## 2018-03-10 ENCOUNTER — Ambulatory Visit (INDEPENDENT_AMBULATORY_CARE_PROVIDER_SITE_OTHER): Payer: BLUE CROSS/BLUE SHIELD | Admitting: Licensed Clinical Social Worker

## 2018-03-10 DIAGNOSIS — F3341 Major depressive disorder, recurrent, in partial remission: Secondary | ICD-10-CM | POA: Diagnosis not present

## 2018-03-24 ENCOUNTER — Ambulatory Visit (INDEPENDENT_AMBULATORY_CARE_PROVIDER_SITE_OTHER): Payer: BLUE CROSS/BLUE SHIELD | Admitting: Licensed Clinical Social Worker

## 2018-03-24 DIAGNOSIS — F3341 Major depressive disorder, recurrent, in partial remission: Secondary | ICD-10-CM

## 2018-03-27 ENCOUNTER — Encounter (HOSPITAL_COMMUNITY): Payer: Self-pay | Admitting: Psychiatry

## 2018-03-28 ENCOUNTER — Other Ambulatory Visit (HOSPITAL_COMMUNITY): Payer: Self-pay | Admitting: Psychiatry

## 2018-03-28 MED ORDER — HYDROXYZINE PAMOATE 25 MG PO CAPS: 25.0000 mg | ORAL_CAPSULE | Freq: Three times a day (TID) | ORAL | 0 refills | Status: DC | PRN

## 2018-04-07 ENCOUNTER — Encounter: Payer: Self-pay | Admitting: Family Medicine

## 2018-04-07 ENCOUNTER — Ambulatory Visit (INDEPENDENT_AMBULATORY_CARE_PROVIDER_SITE_OTHER): Payer: BLUE CROSS/BLUE SHIELD | Admitting: Licensed Clinical Social Worker

## 2018-04-07 DIAGNOSIS — F3341 Major depressive disorder, recurrent, in partial remission: Secondary | ICD-10-CM

## 2018-04-15 ENCOUNTER — Encounter: Payer: Self-pay | Admitting: Family Medicine

## 2018-04-15 ENCOUNTER — Ambulatory Visit: Payer: BLUE CROSS/BLUE SHIELD | Admitting: Family

## 2018-04-15 ENCOUNTER — Other Ambulatory Visit (INDEPENDENT_AMBULATORY_CARE_PROVIDER_SITE_OTHER): Payer: BLUE CROSS/BLUE SHIELD

## 2018-04-15 ENCOUNTER — Encounter: Payer: Self-pay | Admitting: Family

## 2018-04-15 VITALS — BP 116/78 | HR 110 | Temp 98.7°F | Ht 65.75 in | Wt 182.0 lb

## 2018-04-15 DIAGNOSIS — J029 Acute pharyngitis, unspecified: Secondary | ICD-10-CM

## 2018-04-15 LAB — POCT RAPID STREP A (OFFICE): RAPID STREP A SCREEN: NEGATIVE

## 2018-04-15 LAB — MONONUCLEOSIS SCREEN: MONO SCREEN: NEGATIVE

## 2018-04-15 MED ORDER — AZITHROMYCIN 250 MG PO TABS: ORAL_TABLET | ORAL | 0 refills | Status: DC

## 2018-04-15 NOTE — Progress Notes (Signed)
Patricia Morse is a 32 y.o. female with the following history as recorded in EpicCare:  Patient Active Problem List   Diagnosis Date Noted  . Visual disturbance 04/25/2016  . Dizziness 04/25/2016  . Obesity, unspecified 06/08/2013  . Depression 10/10/2012  . Hearing loss 10/10/2012    Current Outpatient Medications  Medication Sig Dispense Refill  . APRI 0.15-30 MG-MCG tablet   5  . cetirizine (ZYRTEC) 10 MG tablet Take 10 mg by mouth daily.    Marland Kitchen FLUoxetine (PROZAC) 40 MG capsule Take 1 capsule (40 mg total) by mouth daily. 90 capsule 1  . hydrOXYzine (VISTARIL) 25 MG capsule Take 1 capsule (25 mg total) by mouth 3 (three) times daily as needed. 30 capsule 0  . Multiple Vitamins-Minerals (MULTIVITAMIN WITH MINERALS) tablet Take 1 tablet by mouth daily.    . Probiotic Product (PROBIOTIC DAILY PO) Take by mouth.    . triamcinolone (KENALOG) 0.1 % paste Apply to canker sore twice a day as needed 5 g 2  . valACYclovir (VALTREX) 1000 MG tablet TAKE 2 TABLETS TWICE A DAY FOR 2 DOSES FOR COLD SORES 30 tablet 7  . cyclobenzaprine (FLEXERIL) 10 MG tablet Take 1/2 or 1 at bedtime as needed for back pain (Patient not taking: Reported on 03/04/2018) 20 tablet 0  . doxycycline (VIBRAMYCIN) 100 MG capsule Take 1 capsule (100 mg total) by mouth 2 (two) times daily. (Patient not taking: Reported on 03/04/2018) 20 capsule 0  . fluticasone (FLONASE) 50 MCG/ACT nasal spray Place 1 spray into both nostrils daily.    . Olopatadine HCl (PATADAY) 0.2 % SOLN Apply 1 drop to eyes daily as needed for allergies (Patient not taking: Reported on 04/15/2018) 2.5 mL 6   No current facility-administered medications for this visit.     Allergies: Amoxicillin; Tdap [tetanus-diphth-acell pertussis]; Hydrocodeine [dihydrocodeine]; and Penicillins  Past Medical History:  Diagnosis Date  . Allergy   . Auditory neuropathy BILATERAL  . Bilateral sensorineural hearing loss    SECONDARY AUDITORY NEUROPATHY  . Cochlear implant  in place 11-15-2005   LEFT EAR    . Depression   . 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 Internal derangement of right knee   . PONV (postoperative nausea and vomiting) POSTOP COCHLEAR  IMPLANT 2006  . Seasonal allergies     Past Surgical History:  Procedure Laterality Date  . BILATERAL MIDDLE EAR EXPLORATION/ SEALING OF OVAL AND ROUND WINDOW FISTULAS  1999 (AGE 36)   PERILYMPH FISTULAS  . CHONDROPLASTY  03/13/2012   Procedure: CHONDROPLASTY;  Surgeon: Shelda Pal, MD;  Location: [facility];  Service: Orthopedics;  Laterality: Right;  . COCHLEAR IMPLANT  11-15-2005  (AGE 89)   LEFT EAR  (CLARION HIGH-RESOLUTION 90K MULTICHANNEL,  1-J ELECTRODE)  . KNEE ARTHROSCOPY  03/13/2012   Procedure: ARTHROSCOPY KNEE;  Surgeon: Shelda Pal, MD;  [facility]  Service: Orthopedics;  Laterality: Right;  RIGHT KNEE DIAGNOSTIC AND OPERATIVE SCOPE  . WISDOM TOOTH EXTRACTION  AGE 84   DENTAL OFFICE    Family History  Problem Relation Age of Onset  . Depression Mother   . Hypertension Mother   . Mental illness Mother   . Anxiety disorder Maternal Grandmother     Social History   Tobacco Use  . Smoking status: Former Smoker    Types: Cigarettes    Last attempt to quit: 03/10/2008    Years since quitting: 10.1  .  Smokeless tobacco: Never Used  . Tobacco comment: SOCIAL SMOKER IN COLLEGE FOR 65YRS -- NO SMOKING SINCE  Substance Use Topics  . 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   Objective:  Vitals:   04/15/18 1415  BP: 116/78  Pulse: (!) 110  Temp: 98.7 F (37.1 C)  TempSrc: Oral  SpO2: 97%  Weight: 182 lb (82.6 kg)  Height: 5' 5.75" (1.67 m)    General: Well developed, well nourished, in no acute distress  Skin : Warm and dry.  Head: Normocephalic and atraumatic  Eyes: Sclera and conjunctiva clear; pupils round and reactive to light; extraocular movements intact  Ears: External normal; canals clear; tympanic membranes normal  Oropharynx: Pink, supple. No suspicious lesions  Neck: Supple without thyromegaly, adenopathy  Lungs: Respirations unlabored; clear to auscultation bilaterally without wheeze, rales, rhonchi  CVS exam: normal rate and regular rhythm.  Neurologic: Alert and oriented; speech intact; face symmetrical; moves all extremities well; CNII-XII intact without focal deficit   Assessment:  1. Sore throat     Plan:    Rapid strep is negative; will update throat culture and Monospot today; symptomatic treatment discussed with patient; Rx for Z-pak #1- hold and fill only if symptoms are worsening over the next 48 hours; work note given as requested; follow-up worse, no better.   No follow-ups on file.  Orders Placed This Encounter  Procedures  . POCT rapid strep A    Requested Prescriptions    No prescriptions requested or ordered in this encounter

## 2018-04-16 ENCOUNTER — Ambulatory Visit: Payer: BLUE CROSS/BLUE SHIELD | Admitting: Family Medicine

## 2018-04-16 ENCOUNTER — Encounter: Payer: Self-pay | Admitting: Family Medicine

## 2018-04-16 VITALS — BP 110/82 | HR 96 | Temp 97.5°F | Ht 65.75 in | Wt 183.2 lb

## 2018-04-16 DIAGNOSIS — M533 Sacrococcygeal disorders, not elsewhere classified: Secondary | ICD-10-CM | POA: Diagnosis not present

## 2018-04-16 DIAGNOSIS — J029 Acute pharyngitis, unspecified: Secondary | ICD-10-CM | POA: Diagnosis not present

## 2018-04-16 MED ORDER — MELOXICAM 15 MG PO TABS
15.0000 mg | ORAL_TABLET | Freq: Every day | ORAL | 0 refills | Status: DC
Start: 2018-04-16 — End: 2018-07-24

## 2018-04-16 NOTE — Patient Instructions (Signed)
Good to see you today! Let's try meloxicam for your back pain- take it daily for 2 weeks.  If you are not doing better after 10 days or so (or if the pain comes back!) please alert me and I will have you see sports med I also gave you a hand-out with some stretches that may be helpful for you  You should get your throat culture back from your other doctor in a day or so.  Let me know if any concerns about your throat

## 2018-04-16 NOTE — Progress Notes (Signed)
[facility] at [facility] [address], [address] [place], [place] [address] [phone]  Date:  04/16/2018   Name:  Patricia Morse   DOB:  1986-06-13   MRN:  [id number]  PCP:  Patricia Cables, MD    Chief Complaint: Back Pain (c/o lower left back pain for at least a month off and on. Pt states pain has been more noticeable the past 2 weeks. Tested for strep yesterday and was negative. )   History of Present Illness:  Patricia Morse is a 32 y.o. very pleasant female patient who presents with the following:  Pt was seen yesterday for ST- strep negative, culture pending Fever gone and her strep was negative so pt thinks likely viral and that she will be ok but just wanted to mention it  Also she has noted pain in her left very lower back/ buttock She has pain if she bends over or lifts something, or when pushing her vacuum No pain down her leg  She has noted this off an on for a month, more consistently for 2 weeks  She is not sure of any injury No numbness or weakness in her legs No bowel or bladder dysfunction  She does tend to have "bad knees," but these have been better since she got new shoes and she is able to jog some. Walking does not hurt her back but the elliptical trainer does bother her back  Wt Readings from Last 3 Encounters:  04/16/18 183 lb 3.2 oz (83.1 kg)  04/15/18 182 lb (82.6 kg)  03/06/18 177 lb 12.8 oz (80.6 kg)   She has tried using advil and it does help somewhat, dulls the pain  Patient Active Problem List   Diagnosis Date Noted  . Visual disturbance 04/25/2016  . Dizziness 04/25/2016  . Obesity, unspecified 06/08/2013  . Depression 10/10/2012  . Hearing loss 10/10/2012    Past Medical History:  Diagnosis Date  . Allergy   . Auditory neuropathy BILATERAL  . Bilateral sensorineural hearing loss    SECONDARY AUDITORY NEUROPATHY  . 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 Depression    . 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 Internal derangement of right knee   . PONV (postoperative nausea and vomiting) POSTOP COCHLEAR  IMPLANT 2006  . Seasonal allergies     Past Surgical History:  Procedure Laterality Date  . BILATERAL MIDDLE EAR EXPLORATION/ SEALING OF OVAL AND ROUND WINDOW FISTULAS  1999 (AGE 51)   PERILYMPH FISTULAS  . CHONDROPLASTY  03/13/2012   Procedure: CHONDROPLASTY;  Surgeon: Shelda Pal, MD;  [facility]: [facility];  Service: Orthopedics;  Laterality: Right;  . COCHLEAR IMPLANT  11-15-2005  (AGE 87)   LEFT EAR  (CLARION HIGH-RESOLUTION 90K MULTICHANNEL,  1-J ELECTRODE)  . KNEE ARTHROSCOPY  03/13/2012   Procedure: ARTHROSCOPY KNEE;  Surgeon: Shelda Pal, MD;  Location: [facility];  Service: Orthopedics;  Laterality: Right;  RIGHT KNEE DIAGNOSTIC AND OPERATIVE SCOPE  . WISDOM TOOTH EXTRACTION  AGE 58   DENTAL OFFICE    Social History   Tobacco Use  . Smoking status: Former Smoker    Types: Cigarettes    Last attempt to quit: 03/10/2008    Years since quitting: 10.1  . Smokeless tobacco: Never Used  . Tobacco comment: SOCIAL SMOKER IN COLLEGE FOR 70YRS -- NO SMOKING SINCE  Substance Use Topics  . Alcohol use: Yes    Alcohol/week: 1.8 - 3.0 oz    Types: 3 - 5 Glasses of wine per week    Comment: OCCASIONAL   . Drug use: No    Family History  Problem Relation Age of Onset  . Depression Mother   . Hypertension Mother   . Mental illness Mother   . Anxiety disorder Maternal Grandmother     Allergies  Allergen Reactions  . Amoxicillin Hives  . Tdap [Tetanus-Diphth-Acell Pertussis]   . Hydrocodeine [Dihydrocodeine] Rash  . Penicillins Rash    Medication list has been reviewed and updated.  Current Outpatient Medications on File Prior to Visit  Medication Sig Dispense Refill  . APRI 0.15-30 MG-MCG tablet   5  . cetirizine (ZYRTEC) 10 MG tablet Take 10 mg by mouth daily.     . cyclobenzaprine (FLEXERIL) 10 MG tablet Take 1/2 or 1 at bedtime as needed for back pain 20 tablet 0  . FLUoxetine (PROZAC) 40 MG capsule Take 1 capsule (40 mg total) by mouth daily. 90 capsule 1  . hydrOXYzine (VISTARIL) 25 MG capsule Take 1 capsule (25 mg total) by mouth 3 (three) times daily as needed. 30 capsule 0  . Multiple Vitamins-Minerals (MULTIVITAMIN WITH MINERALS) tablet Take 1 tablet by mouth daily.    . Probiotic Product (PROBIOTIC DAILY PO) Take by mouth.    . triamcinolone (KENALOG) 0.1 % paste Apply to canker sore twice a day as needed 5 g 2  . valACYclovir (VALTREX) 1000 MG tablet TAKE 2 TABLETS TWICE A DAY FOR 2 DOSES FOR COLD SORES 30 tablet 7  . azithromycin (ZITHROMAX) 250 MG tablet 2 tabs po qd x 1 day; 1 tablet per day x 4 days; (Patient not taking: Reported on 04/16/2018) 6 tablet 0  . fluticasone (FLONASE) 50 MCG/ACT nasal spray Place 1 spray into both nostrils daily.     No current facility-administered medications on file prior to visit.     Review of Systems:  As per HPI- otherwise negative. No fever or chills No CP or SOB She is HOH and has a cochlear implant    Physical Examination: Vitals:   04/16/18 1217  BP: 110/82  Pulse: 96  Temp: (!) 97.5 F (36.4 C)  SpO2: 97%   Vitals:   04/16/18 1217  Weight: 183 lb 3.2 oz (83.1 kg)  Height: 5' 5.75" (1.67 m)   Body mass index is 29.79 kg/m. Ideal Body Weight: Weight in (lb) to have BMI = 25: 153.4  GEN: WDWN, NAD, Non-toxic, A & O x 3, overweight HEENT: Atraumatic, Normocephalic. Neck supple. No masses, No LAD.  Bilateral TM wnl, oropharynx normal.  PEERL,EOMI.  Throat appears benign  Ears and Nose: No external deformity. CV: RRR, No M/G/R. No JVD. No thrill. No extra heart sounds. PULM: CTA B, no wheezes, crackles, rhonchi. No retractions. No resp. distress. No accessory muscle use. EXTR: No c/c/e NEURO Normal gait.  PSYCH: Normally interactive. Conversant. Not depressed or anxious  appearing.  Calm demeanor.  Left SI joint is tender to pressure and with bending forward Normal BLE strength, sensation, DTR, normal SLR   Assessment and Plan: SI (sacroiliac) pain - Plan: meloxicam (MOBIC) 15 MG tablet  Sore throat  Good to see you today! Let's try meloxicam for your back pain- take it daily for 2 weeks.  If you are not doing better after 10 days or so (or if the pain comes back!) please alert me and  I will have you see sports med I also gave you a hand-out with some stretches that may be helpful for you  You should get your throat culture back from your other doctor in a day or so.  Let me know if any concerns about your throat   Signed Abbe Amsterdam, MD

## 2018-04-17 LAB — CULTURE, GROUP A STREP
MICRO NUMBER:: 90616485
SPECIMEN QUALITY: ADEQUATE

## 2018-04-21 ENCOUNTER — Encounter: Payer: Self-pay | Admitting: Family

## 2018-04-21 ENCOUNTER — Encounter: Payer: Self-pay | Admitting: Family Medicine

## 2018-04-22 NOTE — Progress Notes (Deleted)
[facility] at [facility] [address] [place], [place] [address] [phone]  Date:  04/24/2018   Name:  Patricia Morse   DOB:  08-22-1986   MRN:  [id number]  PCP:  Pearline Cables, MD    Chief Complaint: No chief complaint on file.   History of Present Illness:  Patricia Morse is a 32 y.o. very pleasant female patient who presents with the following:  Seen on 5/22- Pt was seen yesterday for ST- strep negative, culture pending Fever gone and her strep was negative so pt thinks likely viral and that she will be ok but just wanted to mention it She then had sent me a mychart message that her throat still hurt and should she take the zpack she was given by UC to hold  Patient Active Problem List   Diagnosis Date Noted  . Visual disturbance 04/25/2016  . Dizziness 04/25/2016  . Obesity, unspecified 06/08/2013  . Depression 10/10/2012  . Hearing loss 10/10/2012    Past Medical History:  Diagnosis Date  . Allergy   . Auditory neuropathy BILATERAL  . Bilateral sensorineural hearing loss    SECONDARY AUDITORY NEUROPATHY  . 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 Depression   . 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 Internal derangement of right knee   . PONV (postoperative nausea and vomiting) POSTOP COCHLEAR  IMPLANT 2006  . Seasonal allergies     Past Surgical History:  Procedure Laterality Date  . BILATERAL MIDDLE EAR EXPLORATION/ SEALING OF OVAL AND ROUND WINDOW FISTULAS  1999 (AGE 66)   PERILYMPH FISTULAS  . CHONDROPLASTY  03/13/2012   Procedure: CHONDROPLASTY;  Surgeon: Shelda Pal, MD;  Location: [facility];  Service: Orthopedics;  Laterality: Right;  . COCHLEAR IMPLANT  11-15-2005  (AGE 20)   LEFT EAR  (CLARION HIGH-RESOLUTION 90K MULTICHANNEL,  1-J ELECTRODE)  . KNEE ARTHROSCOPY  03/13/2012   Procedure: ARTHROSCOPY KNEE;  Surgeon: Shelda Pal, MD;  Location: [facility];  Service: Orthopedics;  Laterality: Right;  RIGHT KNEE DIAGNOSTIC AND OPERATIVE SCOPE  . WISDOM TOOTH EXTRACTION  AGE 17   DENTAL OFFICE    Social History   Tobacco Use  . Smoking status: Former Smoker    Types: Cigarettes    Last attempt to quit: 03/10/2008    Years since quitting: 10.1  . Smokeless tobacco: Never Used  . Tobacco comment: SOCIAL SMOKER IN COLLEGE FOR 39YRS -- NO SMOKING SINCE  Substance Use Topics  . Alcohol use: Yes    Alcohol/week: 1.8 - 3.0 oz    Types: 3 - 5 Glasses of wine per week    Comment: OCCASIONAL   . Drug use: No    Family History  Problem Relation Age of Onset  . Depression Mother   . Hypertension Mother   . Mental illness Mother   . Anxiety disorder Maternal Grandmother     Allergies  Allergen Reactions  . Amoxicillin Hives  . Tdap [Tetanus-Diphth-Acell Pertussis]   . Hydrocodeine [Dihydrocodeine] Rash  . Penicillins Rash    Medication list has been reviewed and updated.  Current Outpatient Medications on File Prior to Visit  Medication Sig Dispense Refill  . APRI 0.15-30 MG-MCG tablet   5  . azithromycin (ZITHROMAX) 250 MG tablet 2 tabs po qd x 1 day; 1 tablet per day x  4 days; (Patient not taking: Reported on 04/16/2018) 6 tablet 0  . cetirizine (ZYRTEC) 10 MG tablet Take 10 mg by mouth daily.    . cyclobenzaprine (FLEXERIL) 10 MG tablet Take 1/2 or 1 at bedtime as needed for back pain 20 tablet 0  . FLUoxetine (PROZAC) 40 MG capsule Take 1 capsule (40 mg total) by mouth daily. 90 capsule 1  . fluticasone (FLONASE) 50 MCG/ACT nasal spray Place 1 spray into both nostrils daily.    . hydrOXYzine (VISTARIL) 25 MG capsule Take 1 capsule (25 mg total) by mouth 3 (three) times daily as needed. 30 capsule 0  . meloxicam (MOBIC) 15 MG tablet Take 1 tablet (15 mg total) by mouth daily. 30 tablet 0  . Multiple Vitamins-Minerals (MULTIVITAMIN WITH MINERALS) tablet Take 1 tablet by mouth daily.     . Probiotic Product (PROBIOTIC DAILY PO) Take by mouth.    . triamcinolone (KENALOG) 0.1 % paste Apply to canker sore twice a day as needed 5 g 2  . valACYclovir (VALTREX) 1000 MG tablet TAKE 2 TABLETS TWICE A DAY FOR 2 DOSES FOR COLD SORES 30 tablet 7   No current facility-administered medications on file prior to visit.     Review of Systems:  As per HPI- otherwise negative.   Physical Examination: There were no vitals filed for this visit. There were no vitals filed for this visit. There is no height or weight on file to calculate BMI. Ideal Body Weight:    GEN: WDWN, NAD, Non-toxic, A & O x 3 HEENT: Atraumatic, Normocephalic. Neck supple. No masses, No LAD. Ears and Nose: No external deformity. CV: RRR, No M/G/R. No JVD. No thrill. No extra heart sounds. PULM: CTA B, no wheezes, crackles, rhonchi. No retractions. No resp. distress. No accessory muscle use. ABD: S, NT, ND, +BS. No rebound. No HSM. EXTR: No c/c/e NEURO Normal gait.  PSYCH: Normally interactive. Conversant. Not depressed or anxious appearing.  Calm demeanor.    Assessment and Plan: ***  Signed Abbe Amsterdam, MD

## 2018-04-24 ENCOUNTER — Ambulatory Visit: Payer: BLUE CROSS/BLUE SHIELD | Admitting: Family Medicine

## 2018-04-28 ENCOUNTER — Ambulatory Visit (INDEPENDENT_AMBULATORY_CARE_PROVIDER_SITE_OTHER): Payer: BLUE CROSS/BLUE SHIELD | Admitting: Licensed Clinical Social Worker

## 2018-04-28 DIAGNOSIS — F3341 Major depressive disorder, recurrent, in partial remission: Secondary | ICD-10-CM

## 2018-04-30 ENCOUNTER — Encounter (HOSPITAL_COMMUNITY): Payer: Self-pay | Admitting: Psychology

## 2018-04-30 NOTE — Progress Notes (Signed)
Sharman CrateWhitley Shumaker is a 32 y.o. female patient who is discharged from counseling as last seen on 08/26/17.  Outpatient Therapist Discharge Summary  Sharman CrateWhitley Shumaker    Jan 23, 1986   Admission Date: 04/16/17   Discharge Date:  04/30/18 Reason for Discharge:  Not active in counseling Diagnosis:  MDD  Comments:  Pt continues to f/u w/ Dr. Derek MoundEksir  Leanne M Yates  .        Forde RadonYATES,LEANNE, LPC

## 2018-05-02 ENCOUNTER — Encounter: Payer: Self-pay | Admitting: Family Medicine

## 2018-05-07 DIAGNOSIS — H903 Sensorineural hearing loss, bilateral: Secondary | ICD-10-CM | POA: Diagnosis not present

## 2018-05-07 DIAGNOSIS — H933X3 Disorders of bilateral acoustic nerves: Secondary | ICD-10-CM | POA: Diagnosis not present

## 2018-05-12 ENCOUNTER — Ambulatory Visit (INDEPENDENT_AMBULATORY_CARE_PROVIDER_SITE_OTHER): Payer: BLUE CROSS/BLUE SHIELD | Admitting: Licensed Clinical Social Worker

## 2018-05-12 DIAGNOSIS — F3341 Major depressive disorder, recurrent, in partial remission: Secondary | ICD-10-CM

## 2018-06-02 ENCOUNTER — Ambulatory Visit (INDEPENDENT_AMBULATORY_CARE_PROVIDER_SITE_OTHER): Payer: BLUE CROSS/BLUE SHIELD | Admitting: Licensed Clinical Social Worker

## 2018-06-02 DIAGNOSIS — F3341 Major depressive disorder, recurrent, in partial remission: Secondary | ICD-10-CM | POA: Diagnosis not present

## 2018-06-02 DIAGNOSIS — L814 Other melanin hyperpigmentation: Secondary | ICD-10-CM | POA: Diagnosis not present

## 2018-06-02 DIAGNOSIS — D225 Melanocytic nevi of trunk: Secondary | ICD-10-CM | POA: Diagnosis not present

## 2018-06-02 DIAGNOSIS — D2361 Other benign neoplasm of skin of right upper limb, including shoulder: Secondary | ICD-10-CM | POA: Diagnosis not present

## 2018-06-12 ENCOUNTER — Encounter: Payer: Self-pay | Admitting: Family Medicine

## 2018-06-12 DIAGNOSIS — H903 Sensorineural hearing loss, bilateral: Secondary | ICD-10-CM | POA: Diagnosis not present

## 2018-06-12 DIAGNOSIS — Z131 Encounter for screening for diabetes mellitus: Secondary | ICD-10-CM

## 2018-06-12 DIAGNOSIS — Z13220 Encounter for screening for lipoid disorders: Secondary | ICD-10-CM

## 2018-06-12 DIAGNOSIS — Z130 Encounter for screening for diseases of the blood and blood-forming organs and certain disorders involving the immune mechanism: Secondary | ICD-10-CM

## 2018-06-12 DIAGNOSIS — H933X3 Disorders of bilateral acoustic nerves: Secondary | ICD-10-CM | POA: Diagnosis not present

## 2018-06-16 ENCOUNTER — Encounter: Payer: Self-pay | Admitting: Family Medicine

## 2018-06-17 ENCOUNTER — Encounter: Payer: Self-pay | Admitting: Family Medicine

## 2018-06-24 ENCOUNTER — Ambulatory Visit (INDEPENDENT_AMBULATORY_CARE_PROVIDER_SITE_OTHER): Payer: BLUE CROSS/BLUE SHIELD | Admitting: Licensed Clinical Social Worker

## 2018-06-24 DIAGNOSIS — F3341 Major depressive disorder, recurrent, in partial remission: Secondary | ICD-10-CM

## 2018-07-07 ENCOUNTER — Ambulatory Visit: Payer: BLUE CROSS/BLUE SHIELD | Admitting: Physician Assistant

## 2018-07-07 ENCOUNTER — Encounter: Payer: Self-pay | Admitting: Family Medicine

## 2018-07-10 ENCOUNTER — Ambulatory Visit: Payer: BLUE CROSS/BLUE SHIELD | Admitting: Family Medicine

## 2018-07-17 ENCOUNTER — Ambulatory Visit: Payer: BLUE CROSS/BLUE SHIELD | Admitting: Licensed Clinical Social Worker

## 2018-07-18 ENCOUNTER — Encounter (HOSPITAL_COMMUNITY): Payer: Self-pay

## 2018-07-21 ENCOUNTER — Encounter: Payer: Self-pay | Admitting: Family Medicine

## 2018-07-22 NOTE — Progress Notes (Addendum)
[facility] [address], [address] [place], [place] [address] [phone] [phone]  Date:  07/24/2018   Name:  Patricia Morse   DOB:  August 22, 1986   MRN:  [id number]  PCP:  Pearline Cablesopland, Jessica C, MD    Chief Complaint: Annual Exam (fasting labs, family hx of diabetes)   History of Present Illness:  Patricia Morse is a 32 y.o. very pleasant female patient who presents with the following:  Here today for a physical exam She is concerned about her mom's history of DM and would like to be screened for same today Cochlear implant in place left ear.  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 some days and will have days when she does both jobs- this can be quite busy   [employer]t Readings from Last 3 Encounters:  07/24/18 188 lb (85.3 kg)  04/16/18 183 lb 3.2 oz (83.1 kg)  04/15/18 182 lb (82.6 kg)   She did sprain her left ankle - this occurred while hiking, she inverted her ankle. This occurred about 2.5 weeks ago- it is getting better- bruising and swelling occurred at time of injury which are mostly better now  She is wearing a velcro brace which does help  She has some tenderness of the lateral ankle but this is overall much better    Patient Active Problem List   Diagnosis Date Noted  . Visual disturbance 04/25/2016  . Dizziness 04/25/2016  . Obesity, unspecified 06/08/2013  . Depression 10/10/2012  . Hearing loss 10/10/2012    Past Medical History:  Diagnosis Date  . Allergy   . Auditory neuropathy BILATERAL  . Bilateral sensorineural hearing loss    SECONDARY AUDITORY NEUROPATHY  . Cochlear implant  in place 11-15-2005   LEFT EAR    . Depression   . 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 Internal derangement of right knee   . PONV (postoperative nausea and vomiting) POSTOP COCHLEAR  IMPLANT 2006  . Seasonal allergies     Past Surgical History:  Procedure Laterality Date  . BILATERAL MIDDLE EAR EXPLORATION/ SEALING OF OVAL AND ROUND WINDOW FISTULAS  1999 (AGE 9)   PERILYMPH FISTULAS  . CHONDROPLASTY  03/13/2012   Procedure: CHONDROPLASTY;  Surgeon: Shelda PalMatthew D Olin, MD;  [facility];  Service: Orthopedics;  Laterality: Right;  . COCHLEAR IMPLANT  11-15-2005  (AGE 74)   LEFT EAR  (CLARION HIGH-RESOLUTION 90K MULTICHANNEL,  1-J ELECTRODE)  . KNEE ARTHROSCOPY  03/13/2012   Procedure: ARTHROSCOPY KNEE;  Surgeon: Shelda PalMatthew D Olin, MD;  [facility];  Service: Orthopedics;  Laterality: Right;  RIGHT KNEE DIAGNOSTIC AND OPERATIVE SCOPE  . WISDOM TOOTH EXTRACTION  AGE 26   DENTAL OFFICE    Social History   Tobacco Use  . Smoking status: Former Smoker    Types: Cigarettes    Last attempt to quit: 03/10/2008    Years since quitting: 10.3  . Smokeless tobacco: Never Used  . Tobacco comment: SOCIAL  SMOKER IN COLLEGE FOR 45YRS -- NO SMOKING SINCE  Substance Use Topics  . Alcohol use: Yes    Alcohol/week: 3.0 - 5.0 standard drinks    Types: 3 - 5 Glasses of wine per week    Comment: OCCASIONAL   . Drug use: No    Family History  Problem Relation Age of Onset  . Depression Mother   . Hypertension Mother   . Mental illness Mother   . Anxiety disorder Maternal Grandmother     Allergies  Allergen Reactions  . Amoxicillin Hives  . Tdap [Tetanus-Diphth-Acell Pertussis]   . Hydrocodeine [Dihydrocodeine] Rash  . Penicillins Rash    Medication list has been reviewed and updated.  Current Outpatient Medications on File Prior to Visit  Medication Sig Dispense Refill  . APRI 0.15-30 MG-MCG tablet   5   . cetirizine (ZYRTEC) 10 MG tablet Take 10 mg by mouth daily.    Marland Kitchen FLUoxetine (PROZAC) 40 MG capsule Take 1 capsule (40 mg total) by mouth daily. 90 capsule 1  . hydrOXYzine (VISTARIL) 25 MG capsule Take 1 capsule (25 mg total) by mouth 3 (three) times daily as needed. 30 capsule 0  . Multiple Vitamins-Minerals (MULTIVITAMIN WITH MINERALS) tablet Take 1 tablet by mouth daily.    . Probiotic Product (PROBIOTIC DAILY PO) Take by mouth.    . triamcinolone (KENALOG) 0.1 % paste Apply to canker sore twice a day as needed 5 g 2  . valACYclovir (VALTREX) 1000 MG tablet TAKE 2 TABLETS TWICE A DAY FOR 2 DOSES FOR COLD SORES 30 tablet 7   No current facility-administered medications on file prior to visit.     Review of Systems:  As per HPI- otherwise negative.   Physical Examination: Vitals:   07/24/18 0925  BP: 114/80  Pulse: 93  Resp: 16  SpO2: 98%   Vitals:   07/24/18 0925  Weight: 188 lb (85.3 kg)  Height: 5' 5.75" (1.67 m)   Body mass index is 30.58 kg/m. Ideal Body Weight: Weight in (lb) to have BMI = 25: 153.4  GEN: WDWN, NAD, Non-toxic, A & O x 3, obese, otherwise looks well  HEENT: Atraumatic, Normocephalic. Neck supple. No masses, No LAD. Cochlear implant on left Right ear wnl HOH Noted that her left pupil is not round in shape- it appears almost an hourglass shape when dilated but becomes more round and normal with light/ constriction. Pt has not had any surgery on this eye.  She had not noted any change herself but her BF did mention that her pupil looked different recently  Ears and Nose: No external deformity. CV: RRR, No M/G/R. No JVD. No thrill. No extra heart sounds. PULM: CTA B, no wheezes, crackles, rhonchi. No retractions. No resp. distress. No accessory muscle use. ABD: S, NT, ND, +BS. No rebound. No HSM. EXTR: No c/c/e NEURO Normal gait.  PSYCH: Normally interactive. Conversant. Not depressed or anxious appearing.  Calm demeanor.  Left ankle: no swelling  or bruise.  Mild tenderness over the ATFL   Assessment and Plan: Physical exam  Screening for diabetes mellitus - Plan: Comprehensive metabolic panel, Hemoglobin A1c  Screening for deficiency anemia - Plan: CBC  Screening for hyperlipidemia - Plan: Lipid panel  Pupil asymmetry  CPE today Labs pending as above Her pap is UTD per GYN, she is on OCP  Her ankle seems to be improving, she is doing a home PT program but will let me know if not continuing to improve to normal.  Left pupil abnl- pt is not really sure how long this may have been present.  She has an ophthalmologist who she has seen since childhood and will call him today to see if this is new and to request an exam asap if it is new   Signed Abbe Amsterdam, MD  Received her labs-  Results for orders placed or performed in visit on 07/24/18  CBC  Result Value Ref Range   WBC 5.1 4.0 - 10.5 K/uL   RBC 4.50 3.87 - 5.11 Mil/uL   Platelets 257.0 150.0 - 400.0 K/uL   Hemoglobin 13.1 12.0 - 15.0 g/dL   HCT 16.1 09.6 - 04.5 %   MCV 85.6 78.0 - 100.0 fl   MCHC 34.0 30.0 - 36.0 g/dL   RDW 40.9 81.1 - 91.4 %  Comprehensive metabolic panel  Result Value Ref Range   Sodium 136 135 - 145 mEq/L   Potassium 4.3 3.5 - 5.1 mEq/L   Chloride 105 96 - 112 mEq/L   CO2 24 19 - 32 mEq/L   Glucose, Bld 84 70 - 99 mg/dL   BUN 13 6 - 23 mg/dL   Creatinine, Ser 7.82 0.40 - 1.20 mg/dL   Total Bilirubin 0.4 0.2 - 1.2 mg/dL   Alkaline Phosphatase 56 39 - 117 U/L   AST 15 0 - 37 U/L   ALT 10 0 - 35 U/L   Total Protein 6.9 6.0 - 8.3 g/dL   Albumin 3.9 3.5 - 5.2 g/dL   Calcium 8.8 8.4 - 95.6 mg/dL   GFR 213.08 >65.78 mL/min  Hemoglobin A1c  Result Value Ref Range   Hgb A1c MFr Bld 5.4 4.6 - 6.5 %  Lipid panel  Result Value Ref Range   Cholesterol 230 (H) 0 - 200 mg/dL   Triglycerides 469.6 (H) 0.0 - 149.0 mg/dL   HDL 29.52 >84.13 mg/dL   VLDL 24.4 0.0 - 01.0 mg/dL   LDL Cholesterol 272 (H) 0 - 99 mg/dL   Total CHOL/HDL Ratio 3     NonHDL 156.30    Your blood counts look good Metabolic profile is normal A1c- diabetes screening test- is normal Cholesterol is good overall- your total and LDL are a bit high, but you also have a high HDL (good cholesterol) to balance this out

## 2018-07-24 ENCOUNTER — Encounter: Payer: Self-pay | Admitting: Family Medicine

## 2018-07-24 ENCOUNTER — Ambulatory Visit (INDEPENDENT_AMBULATORY_CARE_PROVIDER_SITE_OTHER): Payer: BLUE CROSS/BLUE SHIELD | Admitting: Family Medicine

## 2018-07-24 VITALS — BP 114/80 | HR 93 | Resp 16 | Ht 65.75 in | Wt 188.0 lb

## 2018-07-24 DIAGNOSIS — Z130 Encounter for screening for diseases of the blood and blood-forming organs and certain disorders involving the immune mechanism: Secondary | ICD-10-CM | POA: Diagnosis not present

## 2018-07-24 DIAGNOSIS — Z13220 Encounter for screening for lipoid disorders: Secondary | ICD-10-CM

## 2018-07-24 DIAGNOSIS — H5702 Anisocoria: Secondary | ICD-10-CM

## 2018-07-24 DIAGNOSIS — Z131 Encounter for screening for diabetes mellitus: Secondary | ICD-10-CM

## 2018-07-24 DIAGNOSIS — Z0000 Encounter for general adult medical examination without abnormal findings: Secondary | ICD-10-CM | POA: Diagnosis not present

## 2018-07-24 LAB — CBC
HCT: 38.6 % (ref 36.0–46.0)
HEMOGLOBIN: 13.1 g/dL (ref 12.0–15.0)
MCHC: 34 g/dL (ref 30.0–36.0)
MCV: 85.6 fl (ref 78.0–100.0)
PLATELETS: 257 10*3/uL (ref 150.0–400.0)
RBC: 4.5 Mil/uL (ref 3.87–5.11)
RDW: 12.8 % (ref 11.5–15.5)
WBC: 5.1 10*3/uL (ref 4.0–10.5)

## 2018-07-24 LAB — COMPREHENSIVE METABOLIC PANEL
ALK PHOS: 56 U/L (ref 39–117)
ALT: 10 U/L (ref 0–35)
AST: 15 U/L (ref 0–37)
Albumin: 3.9 g/dL (ref 3.5–5.2)
BUN: 13 mg/dL (ref 6–23)
CO2: 24 mEq/L (ref 19–32)
CREATININE: 0.71 mg/dL (ref 0.40–1.20)
Calcium: 8.8 mg/dL (ref 8.4–10.5)
Chloride: 105 mEq/L (ref 96–112)
GFR: 102.3 mL/min (ref >60.00)
GLUCOSE: 84 mg/dL (ref 70–99)
Potassium: 4.3 mEq/L (ref 3.5–5.1)
Sodium: 136 mEq/L (ref 135–145)
TOTAL PROTEIN: 6.9 g/dL (ref 6.0–8.3)
Total Bilirubin: 0.4 mg/dL (ref 0.2–1.2)

## 2018-07-24 LAB — LIPID PANEL
CHOL/HDL RATIO: 3
Cholesterol: 230 mg/dL — ABNORMAL HIGH (ref 0–200)
HDL: 73.9 mg/dL (ref >39.00)
LDL CALC: 122 mg/dL — AB (ref 0–99)
NONHDL: 156.3
TRIGLYCERIDES: 174 mg/dL — AB (ref 0.0–149.0)
VLDL: 34.8 mg/dL (ref 0.0–40.0)

## 2018-07-24 LAB — HEMOGLOBIN A1C: Hgb A1c MFr Bld: 5.4 % (ref 4.6–6.5)

## 2018-07-24 NOTE — Patient Instructions (Addendum)
Good to see you today!  I will be in touch with your labs asap Your ankle does seem to be sprained- please let me know if not continuing to improve Your left pupil has an abnormal shape today- I had not noticed this in the past.  Please call your eye doctor today and make an appt to have an exam asap   Health Maintenance, Female Adopting a healthy lifestyle and getting preventive care can go a long way to promote health and wellness. Talk with your health care provider about what schedule of regular examinations is right for you. This is a good chance for you to check in with your provider about disease prevention and staying healthy. In between checkups, there are plenty of things you can do on your own. Experts have done a lot of research about which lifestyle changes and preventive measures are most likely to keep you healthy. Ask your health care provider for more information. Weight and diet Eat a healthy diet  Be sure to include plenty of vegetables, fruits, low-fat dairy products, and lean protein.  Do not eat a lot of foods high in solid fats, added sugars, or salt.  Get regular exercise. This is one of the most important things you can do for your health. ? Most adults should exercise for at least 150 minutes each week. The exercise should increase your heart rate and make you sweat (moderate-intensity exercise). ? Most adults should also do strengthening exercises at least twice a week. This is in addition to the moderate-intensity exercise.  Maintain a healthy weight  Body mass index (BMI) is a measurement that can be used to identify possible weight problems. It estimates body fat based on height and weight. Your health care provider can help determine your BMI and help you achieve or maintain a healthy weight.  For females 26 years of age and older: ? A BMI below 18.5 is considered underweight. ? A BMI of 18.5 to 24.9 is normal. ? A BMI of 25 to 29.9 is considered  overweight. ? A BMI of 30 and above is considered obese.  Watch levels of cholesterol and blood lipids  You should start having your blood tested for lipids and cholesterol at 30 years of age, then have this test every 5 years.  You may need to have your cholesterol levels checked more often if: ? Your lipid or cholesterol levels are high. ? You are older than 30 years of age. ? You are at high risk for heart disease.  Cancer screening Lung Cancer  Lung cancer screening is recommended for adults 49-72 years old who are at high risk for lung cancer because of a history of smoking.  A yearly low-dose CT scan of the lungs is recommended for people who: ? Currently smoke. ? Have quit within the past 15 years. ? Have at least a 30-pack-year history of smoking. A pack year is smoking an average of one pack of cigarettes a day for 1 year.  Yearly screening should continue until it has been 15 years since you quit.  Yearly screening should stop if you develop a health problem that would prevent you from having lung cancer treatment.  Breast Cancer  Practice breast self-awareness. This means understanding how your breasts normally appear and feel.  It also means doing regular breast self-exams. Let your health care provider know about any changes, no matter how small.  If you are in your 20s or 30s, you should have a clinical  breast exam (CBE) by a health care provider every 1-3 years as part of a regular health exam.  If you are 72 or older, have a CBE every year. Also consider having a breast X-ray (mammogram) every year.  If you have a family history of breast cancer, talk to your health care provider about genetic screening.  If you are at high risk for breast cancer, talk to your health care provider about having an MRI and a mammogram every year.  Breast cancer gene (BRCA) assessment is recommended for women who have family members with BRCA-related cancers. BRCA-related cancers  include: ? Breast. ? Ovarian. ? Tubal. ? Peritoneal cancers.  Results of the assessment will determine the need for genetic counseling and BRCA1 and BRCA2 testing.  Cervical Cancer Your health care provider may recommend that you be screened regularly for cancer of the pelvic organs (ovaries, uterus, and vagina). This screening involves a pelvic examination, including checking for microscopic changes to the surface of your cervix (Pap test). You may be encouraged to have this screening done every 3 years, beginning at age 11.  For women ages 26-65, health care providers may recommend pelvic exams and Pap testing every 3 years, or they may recommend the Pap and pelvic exam, combined with testing for human papilloma virus (HPV), every 5 years. Some types of HPV increase your risk of cervical cancer. Testing for HPV may also be done on women of any age with unclear Pap test results.  Other health care providers may not recommend any screening for nonpregnant women who are considered low risk for pelvic cancer and who do not have symptoms. Ask your health care provider if a screening pelvic exam is right for you.  If you have had past treatment for cervical cancer or a condition that could lead to cancer, you need Pap tests and screening for cancer for at least 20 years after your treatment. If Pap tests have been discontinued, your risk factors (such as having a new sexual partner) need to be reassessed to determine if screening should resume. Some women have medical problems that increase the chance of getting cervical cancer. In these cases, your health care provider may recommend more frequent screening and Pap tests.  Colorectal Cancer  This type of cancer can be detected and often prevented.  Routine colorectal cancer screening usually begins at 30 years of age and continues through 30 years of age.  Your health care provider may recommend screening at an earlier age if you have risk factors  for colon cancer.  Your health care provider may also recommend using home test kits to check for hidden blood in the stool.  A small camera at the end of a tube can be used to examine your colon directly (sigmoidoscopy or colonoscopy). This is done to check for the earliest forms of colorectal cancer.  Routine screening usually begins at age 58.  Direct examination of the colon should be repeated every 5-10 years through 30 years of age. However, you may need to be screened more often if early forms of precancerous polyps or small growths are found.  Skin Cancer  Check your skin from head to toe regularly.  Tell your health care provider about any new moles or changes in moles, especially if there is a change in a mole's shape or color.  Also tell your health care provider if you have a mole that is larger than the size of a pencil eraser.  Always use sunscreen. Apply  sunscreen liberally and repeatedly throughout the day.  Protect yourself by wearing long sleeves, pants, a wide-brimmed hat, and sunglasses whenever you are outside.  Heart disease, diabetes, and high blood pressure  High blood pressure causes heart disease and increases the risk of stroke. High blood pressure is more likely to develop in: ? People who have blood pressure in the high end of the normal range (130-139/85-89 mm Hg). ? People who are overweight or obese. ? People who are African American.  If you are 23-42 years of age, have your blood pressure checked every 3-5 years. If you are 21 years of age or older, have your blood pressure checked every year. You should have your blood pressure measured twice-once when you are at a hospital or clinic, and once when you are not at a hospital or clinic. Record the average of the two measurements. To check your blood pressure when you are not at a hospital or clinic, you can use: ? An automated blood pressure machine at a pharmacy. ? A home blood pressure monitor.  If  you are between 14 years and 30 years old, ask your health care provider if you should take aspirin to prevent strokes.  Have regular diabetes screenings. This involves taking a blood sample to check your fasting blood sugar level. ? If you are at a normal weight and have a low risk for diabetes, have this test once every three years after 30 years of age. ? If you are overweight and have a high risk for diabetes, consider being tested at a younger age or more often. Preventing infection Hepatitis B  If you have a higher risk for hepatitis B, you should be screened for this virus. You are considered at high risk for hepatitis B if: ? You were born in a country where hepatitis B is common. Ask your health care provider which countries are considered high risk. ? Your parents were born in a high-risk country, and you have not been immunized against hepatitis B (hepatitis B vaccine). ? You have HIV or AIDS. ? You use needles to inject street drugs. ? You live with someone who has hepatitis B. ? You have had sex with someone who has hepatitis B. ? You get hemodialysis treatment. ? You take certain medicines for conditions, including cancer, organ transplantation, and autoimmune conditions.  Hepatitis C  Blood testing is recommended for: ? Everyone born from [birth year] through 1963. ? Anyone with known risk factors for hepatitis C.  Sexually transmitted infections (STIs)  You should be screened for sexually transmitted infections (STIs) including gonorrhea and chlamydia if: ? You are sexually active and are younger than 30 years of age. ? You are older than 30 years of age and your health care provider tells you that you are at risk for this type of infection. ? Your sexual activity has changed since you were last screened and you are at an increased risk for chlamydia or gonorrhea. Ask your health care provider if you are at risk.  If you do not have HIV, but are at risk, it may be recommended  that you take a prescription medicine daily to prevent HIV infection. This is called pre-exposure prophylaxis (PrEP). You are considered at risk if: ? You are sexually active and do not regularly use condoms or know the HIV status of your partner(s). ? You take drugs by injection. ? You are sexually active with a partner who has HIV.  Talk with your health care provider  about whether you are at high risk of being infected with HIV. If you choose to begin PrEP, you should first be tested for HIV. You should then be tested every 3 months for as long as you are taking PrEP. Pregnancy  If you are premenopausal and you may become pregnant, ask your health care provider about preconception counseling.  If you may become pregnant, take 400 to 800 micrograms (mcg) of folic acid every day.  If you want to prevent pregnancy, talk to your health care provider about birth control (contraception). Osteoporosis and menopause  Osteoporosis is a disease in which the bones lose minerals and strength with aging. This can result in serious bone fractures. Your risk for osteoporosis can be identified using a bone density scan.  If you are 39 years of age or older, or if you are at risk for osteoporosis and fractures, ask your health care provider if you should be screened.  Ask your health care provider whether you should take a calcium or vitamin D supplement to lower your risk for osteoporosis.  Menopause may have certain physical symptoms and risks.  Hormone replacement therapy may reduce some of these symptoms and risks. Talk to your health care provider about whether hormone replacement therapy is right for you. Follow these instructions at home:  Schedule regular health, dental, and eye exams.  Stay current with your immunizations.  Do not use any tobacco products including cigarettes, chewing tobacco, or electronic cigarettes.  If you are pregnant, do not drink alcohol.  If you are  breastfeeding, limit how much and how often you drink alcohol.  Limit alcohol intake to no more than 1 drink per day for nonpregnant women. One drink equals 12 ounces of beer, 5 ounces of wine, or 1 ounces of hard liquor.  Do not use street drugs.  Do not share needles.  Ask your health care provider for help if you need support or information about quitting drugs.  Tell your health care provider if you often feel depressed.  Tell your health care provider if you have ever been abused or do not feel safe at home. This information is not intended to replace advice given to you by your health care provider. Make sure you discuss any questions you have with your health care provider. Document Released: 05/28/2011 Document Revised: 04/19/2016 Document Reviewed: 08/16/2015 Elsevier Interactive Patient Education  [employer].

## 2018-08-06 ENCOUNTER — Ambulatory Visit (INDEPENDENT_AMBULATORY_CARE_PROVIDER_SITE_OTHER): Payer: BLUE CROSS/BLUE SHIELD | Admitting: Licensed Clinical Social Worker

## 2018-08-06 DIAGNOSIS — F3341 Major depressive disorder, recurrent, in partial remission: Secondary | ICD-10-CM

## 2018-08-08 ENCOUNTER — Encounter: Payer: Self-pay | Admitting: Family Medicine

## 2018-08-13 MED ORDER — VALACYCLOVIR HCL 1 G PO TABS: ORAL_TABLET | ORAL | 7 refills | Status: DC

## 2018-08-20 ENCOUNTER — Ambulatory Visit (INDEPENDENT_AMBULATORY_CARE_PROVIDER_SITE_OTHER): Payer: BLUE CROSS/BLUE SHIELD | Admitting: Licensed Clinical Social Worker

## 2018-08-20 DIAGNOSIS — F3341 Major depressive disorder, recurrent, in partial remission: Secondary | ICD-10-CM | POA: Diagnosis not present

## 2018-08-26 DIAGNOSIS — E663 Overweight: Secondary | ICD-10-CM | POA: Diagnosis not present

## 2018-09-03 ENCOUNTER — Ambulatory Visit (HOSPITAL_COMMUNITY): Payer: Self-pay | Admitting: Psychiatry

## 2018-09-04 ENCOUNTER — Ambulatory Visit (INDEPENDENT_AMBULATORY_CARE_PROVIDER_SITE_OTHER): Payer: BLUE CROSS/BLUE SHIELD | Admitting: Licensed Clinical Social Worker

## 2018-09-04 DIAGNOSIS — F3341 Major depressive disorder, recurrent, in partial remission: Secondary | ICD-10-CM

## 2018-09-10 DIAGNOSIS — F331 Major depressive disorder, recurrent, moderate: Secondary | ICD-10-CM | POA: Diagnosis not present

## 2018-09-13 NOTE — Progress Notes (Signed)
[facility] [address] [place], [place] [address] [phone]  Date:  09/17/2018   Name:  Patricia Morse   DOB:  1986-09-12   MRN:  [id number]  PCP:  Patricia Cables, MD    Chief Complaint: Immunizations (penumovax, physical form for surgery)   History of Present Illness:  Patricia Morse is a 32 y.o. very pleasant female patient who presents with the following:  Here today to to get immunization prior to her surgery and also for pre-op clearance for a RIGHt cochlear implant.  This will be done in December per the [facility]   She had her left ear implant placed in her teens  Will have a right implant this time Besides her hearing loss Patricia Morse is generally in good health and we don't anticipate any complications with anesthesia She does exercise on a regular basis  She will work out for 30 minutes daily, generally walking, riding a bike or elliptical machine  No CP or SOB with exercise Recent labs done in August, all ok  She does need a pneumovax prior to her operation which we will give her today   Patient Active Problem List   Diagnosis Date Noted  . Visual disturbance 04/25/2016  . Dizziness 04/25/2016  . Obesity, unspecified 06/08/2013  . Depression 10/10/2012  . Hearing loss 10/10/2012    Past Medical History:  Diagnosis Date  . Allergy   . Auditory neuropathy BILATERAL  . Bilateral sensorineural hearing loss    SECONDARY AUDITORY NEUROPATHY  . 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 Depression   . 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 Internal derangement of right knee   . PONV (postoperative nausea and vomiting) POSTOP COCHLEAR  IMPLANT 2006  . Seasonal allergies     Past Surgical History:  Procedure Laterality Date  . BILATERAL MIDDLE EAR EXPLORATION/ SEALING OF OVAL AND ROUND WINDOW FISTULAS  1999 (AGE 68)   PERILYMPH  FISTULAS  . CHONDROPLASTY  03/13/2012   Procedure: CHONDROPLASTY;  Surgeon: Shelda Pal, MD;  Location: [facility];  Service: Orthopedics;  Laterality: Right;  . COCHLEAR IMPLANT  11-15-2005  (AGE 70)   LEFT EAR  (CLARION HIGH-RESOLUTION 90K MULTICHANNEL,  1-J ELECTRODE)  . KNEE ARTHROSCOPY  03/13/2012   Procedure: ARTHROSCOPY KNEE;  Surgeon: Shelda Pal, MD;  Location: [facility];  Service: Orthopedics;  Laterality: Right;  RIGHT KNEE DIAGNOSTIC AND OPERATIVE SCOPE  . WISDOM TOOTH EXTRACTION  AGE 14   DENTAL OFFICE    Social History   Tobacco Use  . Smoking status: Former Smoker    Types: Cigarettes    Last attempt to quit: 03/10/2008    Years since quitting: 10.5  . Smokeless tobacco: Never Used  . Tobacco comment: SOCIAL SMOKER IN COLLEGE FOR 67YRS -- NO SMOKING SINCE  Substance Use Topics  . Alcohol use: Yes    Alcohol/week: 3.0 - 5.0 standard drinks    Types: 3 - 5 Glasses of wine per week    Comment: OCCASIONAL   . Drug use: No    Family History  Problem Relation Age of Onset  . Depression Mother   . Hypertension Mother   . Mental illness Mother   . Anxiety disorder Maternal Grandmother     Allergies  Allergen Reactions  . Amoxicillin Hives  .  Tdap [Tetanus-Diphth-Acell Pertussis]   . Hydrocodeine [Dihydrocodeine] Rash  . Penicillins Rash    Medication list has been reviewed and updated.  Current Outpatient Medications on File Prior to Visit  Medication Sig Dispense Refill  . APRI 0.15-30 MG-MCG tablet   5  . cetirizine (ZYRTEC) 10 MG tablet Take 10 mg by mouth daily.    Marland Kitchen FLUoxetine (PROZAC) 40 MG capsule Take 1 capsule (40 mg total) by mouth daily. 90 capsule 1  . hydrOXYzine (VISTARIL) 25 MG capsule Take 1 capsule (25 mg total) by mouth 3 (three) times daily as needed. 30 capsule 0  . Multiple Vitamins-Minerals (MULTIVITAMIN WITH MINERALS) tablet Take 1 tablet by mouth daily.    . Probiotic Product (PROBIOTIC DAILY PO)  Take by mouth.    . triamcinolone (KENALOG) 0.1 % paste Apply to canker sore twice a day as needed 5 g 2  . valACYclovir (VALTREX) 1000 MG tablet TAKE 2 TABLETS TWICE A DAY FOR 2 DOSES FOR COLD SORES 30 tablet 7   No current facility-administered medications on file prior to visit.     Review of Systems:  As per HPI- otherwise negative.   Physical Examination: Vitals:   09/17/18 1140  BP: 116/80  Pulse: 98  Resp: 16  Temp: 98.7 F (37.1 C)  SpO2: 98%   Vitals:   09/17/18 1140  Weight: 186 lb (84.4 kg)  Height: 5' 5.75" (1.67 m)   Body mass index is 30.25 kg/m. Ideal Body Weight: Weight in (lb) to have BMI = 25: 153.4  GEN: WDWN, NAD, Non-toxic, A & O x 3, overweight, looks well  HEENT: Atraumatic, Normocephalic. Neck supple. No masses, No LAD.  Left cochlear implant  Ears and Nose: No external deformity.   CV: RRR, No M/G/R. No JVD. No thrill. No extra heart sounds. PULM: CTA B, no wheezes, crackles, rhonchi. No retractions. No resp. distress. No accessory muscle use. ABD: S, NT, ND, +BS. No rebound. No HSM. EXTR: No c/c/e NEURO Normal gait.  PSYCH: Normally interactive. Conversant. Not depressed or anxious appearing.  Calm demeanor.   EKG: normal SR with RSR'V1 Assessment and Plan: Pre-op evaluation - Plan: EKG 12-Lead  Immunization due - Plan: Pneumococcal polysaccharide vaccine 23-valent greater than or equal to 2yo subcutaneous/IM  Pneumovax given. Cleared for operation from my perspective - sent letter to her ENT office   Signed Abbe Amsterdam, MD

## 2018-09-17 ENCOUNTER — Ambulatory Visit (INDEPENDENT_AMBULATORY_CARE_PROVIDER_SITE_OTHER): Payer: BLUE CROSS/BLUE SHIELD | Admitting: Licensed Clinical Social Worker

## 2018-09-17 ENCOUNTER — Encounter: Payer: Self-pay | Admitting: Family Medicine

## 2018-09-17 ENCOUNTER — Ambulatory Visit: Payer: BLUE CROSS/BLUE SHIELD | Admitting: Family Medicine

## 2018-09-17 VITALS — BP 116/80 | HR 98 | Temp 98.7°F | Resp 16 | Ht 65.75 in | Wt 186.0 lb

## 2018-09-17 DIAGNOSIS — Z23 Encounter for immunization: Secondary | ICD-10-CM | POA: Diagnosis not present

## 2018-09-17 DIAGNOSIS — F3341 Major depressive disorder, recurrent, in partial remission: Secondary | ICD-10-CM | POA: Diagnosis not present

## 2018-09-17 DIAGNOSIS — Z01818 Encounter for other preprocedural examination: Secondary | ICD-10-CM | POA: Diagnosis not present

## 2018-09-17 NOTE — Patient Instructions (Signed)
EKG looks fine, pneumonia shot given today I will fax a surgical clearance note to your surgeon

## 2018-09-24 DIAGNOSIS — H903 Sensorineural hearing loss, bilateral: Secondary | ICD-10-CM | POA: Diagnosis not present

## 2018-09-24 DIAGNOSIS — H933X3 Disorders of bilateral acoustic nerves: Secondary | ICD-10-CM | POA: Diagnosis not present

## 2018-09-28 ENCOUNTER — Encounter: Payer: Self-pay | Admitting: Family Medicine

## 2018-09-30 NOTE — Progress Notes (Deleted)
[facility] [address] [place], [place] [address] [phone]  Date:  10/01/2018   Name:  Patricia Morse   DOB:  1986/05/16   MRN:  [id number]  PCP:  Pearline Cables, MD    Chief Complaint: No chief complaint on file.   History of Present Illness:  Patricia Morse is a 32 y.o. very pleasant female patient who presents with the following:  She was in just recently to have a pneumovax prior to planned cochlear implant procedure on 10/23 She then sent me a mychart message regarding illness-  Hi Yvonnie- sorry that you got so sick! It is hard to know if this was due to the shot or not- fortunately I don't think you will need to have this shot again until you are 67!  Certainly I am glad to see you in the office this week if need be  I would think that you will be better in time for your trip to [place], but see me if you need to!  JC    I wanted to report what happened after i got the pneumonia vaccine on wed oct 23.  I told you that i had had a mild cold previously, about ten days prior. I felt fine, but i had a lingering dry cough. After the shot, i was fine. I had a sore arm for about 3 days. About 24 hours after i got the shot, i got an intense intense headache. Thursday evening i had it, took medicine, woke up with it still on Friday. Looked up vaccine side effects, and i chalked it up to immune reaction and assumed it would go away. It did, about midday/afternoon Friday. However, Saturday (oct 26), my cough seemed to worsen. As the evening wore on, i felt tired. I went to bed. Early morning Sunday i woke with a productive "junky" cough, and knew i was getting sick again. It has been 7 days. I still feel like craaaap. I have a wet cough, runny nose, general ick feeling, but no fever though. I have been taking vick's dayquil around the clock so i can work (I missed one shift last week, tues oct 29). I take zinc, vitamin c  and echinacea when i feel i am getting sick and sometimes it cuts it short (it did in early oct), but i was either too late this time or it was just a very bad cold. I know it's probably likely i was just came in contact with a cold sometime before my shot and it just started symptoms afterward, but i wanted to report in case it's me getting sick FROM the vaccine. I also wonder if it made my previous cold "come back" with vengeance? Haha..  I am going to [facility] [place] Nov 14. How can i be better by then? Fluids, rest, etc? Is working out good or should i bench myself? I haven't in about 9 days bc i felt so bad last week and had to work about 40 hours between 2 jobs. (So i have not had much opportunity to rest, but this coming week i only work 20-ish hours.)     Patient Active Problem List   Diagnosis Date Noted  . Visual disturbance 04/25/2016  . Dizziness 04/25/2016  . Obesity, unspecified 06/08/2013  . Depression 10/10/2012  . Hearing loss 10/10/2012    Past Medical History:  Diagnosis Date  . Allergy   . Auditory neuropathy  BILATERAL  . Bilateral sensorineural hearing loss    SECONDARY AUDITORY NEUROPATHY  . 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 Depression   . 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 Internal derangement of right knee   . PONV (postoperative nausea and vomiting) POSTOP COCHLEAR  IMPLANT 2006  . Seasonal allergies     Past Surgical History:  Procedure Laterality Date  . BILATERAL MIDDLE EAR EXPLORATION/ SEALING OF OVAL AND ROUND WINDOW FISTULAS  1999 (AGE 41)   PERILYMPH FISTULAS  . CHONDROPLASTY  03/13/2012   Procedure: CHONDROPLASTY;  Surgeon: Shelda Pal, MD;  Location: [facility];  Service: Orthopedics;  Laterality: Right;  . COCHLEAR IMPLANT  11-15-2005  (AGE 89)   LEFT EAR  (CLARION HIGH-RESOLUTION 90K MULTICHANNEL,  1-J ELECTRODE)  . KNEE ARTHROSCOPY  03/13/2012   Procedure: ARTHROSCOPY  KNEE;  Surgeon: Shelda Pal, MD;  Location: [facility];  Service: Orthopedics;  Laterality: Right;  RIGHT KNEE DIAGNOSTIC AND OPERATIVE SCOPE  . WISDOM TOOTH EXTRACTION  AGE 43   DENTAL OFFICE    Social History   Tobacco Use  . Smoking status: Former Smoker    Types: Cigarettes    Last attempt to quit: 03/10/2008    Years since quitting: 10.5  . Smokeless tobacco: Never Used  . Tobacco comment: SOCIAL SMOKER IN COLLEGE FOR 42YRS -- NO SMOKING SINCE  Substance Use Topics  . Alcohol use: Yes    Alcohol/week: 3.0 - 5.0 standard drinks    Types: 3 - 5 Glasses of wine per week    Comment: OCCASIONAL   . Drug use: No    Family History  Problem Relation Age of Onset  . Depression Mother   . Hypertension Mother   . Mental illness Mother   . Anxiety disorder Maternal Grandmother     Allergies  Allergen Reactions  . Amoxicillin Hives  . Tdap [Tetanus-Diphth-Acell Pertussis]   . Hydrocodeine [Dihydrocodeine] Rash  . Penicillins Rash    Medication list has been reviewed and updated.  Current Outpatient Medications on File Prior to Visit  Medication Sig Dispense Refill  . APRI 0.15-30 MG-MCG tablet   5  . cetirizine (ZYRTEC) 10 MG tablet Take 10 mg by mouth daily.    Marland Kitchen FLUoxetine (PROZAC) 40 MG capsule Take 1 capsule (40 mg total) by mouth daily. 90 capsule 1  . hydrOXYzine (VISTARIL) 25 MG capsule Take 1 capsule (25 mg total) by mouth 3 (three) times daily as needed. 30 capsule 0  . Multiple Vitamins-Minerals (MULTIVITAMIN WITH MINERALS) tablet Take 1 tablet by mouth daily.    . Probiotic Product (PROBIOTIC DAILY PO) Take by mouth.    . triamcinolone (KENALOG) 0.1 % paste Apply to canker sore twice a day as needed 5 g 2  . valACYclovir (VALTREX) 1000 MG tablet TAKE 2 TABLETS TWICE A DAY FOR 2 DOSES FOR COLD SORES 30 tablet 7   No current facility-administered medications on file prior to visit.     Review of Systems:  As per HPI- otherwise  negative.   Physical Examination: There were no vitals filed for this visit. There were no vitals filed for this visit. There is no height or weight on file to calculate BMI. Ideal Body Weight:    GEN: WDWN, NAD, Non-toxic, A & O x 3 HEENT: Atraumatic, Normocephalic. Neck supple. No masses, No LAD. Ears and Nose: No external deformity.  CV: RRR, No M/G/R. No JVD. No thrill. No extra heart sounds. PULM: CTA B, no wheezes, crackles, rhonchi. No retractions. No resp. distress. No accessory muscle use. ABD: S, NT, ND, +BS. No rebound. No HSM. EXTR: No c/c/e NEURO Normal gait.  PSYCH: Normally interactive. Conversant. Not depressed or anxious appearing.  Calm demeanor.    Assessment and Plan: ***  Signed Lamar Blinks, MD

## 2018-10-01 ENCOUNTER — Ambulatory Visit: Payer: BLUE CROSS/BLUE SHIELD | Admitting: Internal Medicine

## 2018-10-01 ENCOUNTER — Ambulatory Visit: Payer: BLUE CROSS/BLUE SHIELD | Admitting: Family Medicine

## 2018-10-02 ENCOUNTER — Encounter: Payer: Self-pay | Admitting: Medical

## 2018-10-02 ENCOUNTER — Ambulatory Visit: Payer: BLUE CROSS/BLUE SHIELD | Admitting: Medical

## 2018-10-02 ENCOUNTER — Ambulatory Visit (INDEPENDENT_AMBULATORY_CARE_PROVIDER_SITE_OTHER): Payer: BLUE CROSS/BLUE SHIELD | Admitting: Licensed Clinical Social Worker

## 2018-10-02 VITALS — BP 110/81 | HR 91 | Temp 98.9°F | Resp 16 | Ht 65.7 in | Wt 187.0 lb

## 2018-10-02 DIAGNOSIS — J0100 Acute maxillary sinusitis, unspecified: Secondary | ICD-10-CM | POA: Diagnosis not present

## 2018-10-02 DIAGNOSIS — J40 Bronchitis, not specified as acute or chronic: Secondary | ICD-10-CM

## 2018-10-02 DIAGNOSIS — R059 Cough, unspecified: Secondary | ICD-10-CM

## 2018-10-02 DIAGNOSIS — F3341 Major depressive disorder, recurrent, in partial remission: Secondary | ICD-10-CM

## 2018-10-02 DIAGNOSIS — R05 Cough: Secondary | ICD-10-CM

## 2018-10-02 MED ORDER — AZITHROMYCIN 250 MG PO TABS: ORAL_TABLET | ORAL | 0 refills | Status: DC

## 2018-10-02 MED ORDER — BENZONATATE 100 MG PO CAPS: 100.0000 mg | ORAL_CAPSULE | Freq: Three times a day (TID) | ORAL | 0 refills | Status: DC | PRN

## 2018-10-02 MED ORDER — FLUTICASONE PROPIONATE 50 MCG/ACT NA SUSP: 2.0000 | Freq: Every day | NASAL | 1 refills | Status: DC

## 2018-10-02 NOTE — Patient Instructions (Signed)
You appear to have bronchitis and sinusitis. Rest hydrate and tylenol for fever. I am prescribing cough medicine benzonatate, and azithromycin antibiotic. For your nasal congestion rx flonase.  You should gradually get better. If not then notify us and would recommend a chest xray.  Follow up in 7-10 days or as needed 

## 2018-10-02 NOTE — Progress Notes (Signed)
Subjective:    Patient ID: Patricia Morse, female    DOB: 01/25/1986, 32 y.o.   MRN: [id number]  HPI  Pt has recent nasal and chest congestion. Some intermittent productive cough for about 11 days. Started out with mild uri type symptoms and was not getting better. Also blowing out some colored mucus from her nose.   No fever, no chills or sweats. Next week traveling to [place].  LMP- on currently.  Review of Systems  Constitutional: Negative for chills, fatigue and fever.  HENT: Positive for congestion, sinus pressure and sinus pain. Negative for ear pain and sore throat.   Respiratory: Positive for cough. Negative for chest tightness, shortness of breath and wheezing.   Cardiovascular: Negative for chest pain and palpitations.  Gastrointestinal: Negative for abdominal pain.  Genitourinary: Negative for difficulty urinating and dysuria.  Musculoskeletal: Negative for back pain.  Skin: Negative for rash.  Neurological: Negative for dizziness and headaches.  Hematological: Negative for adenopathy. Does not bruise/bleed easily.  Psychiatric/Behavioral: Negative for behavioral problems and confusion.   Past Medical History:  Diagnosis Date  . Allergy   . Auditory neuropathy BILATERAL  . Bilateral sensorineural hearing loss    SECONDARY AUDITORY NEUROPATHY  . 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 Depression   . 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 Internal derangement of right knee   . PONV (postoperative nausea and vomiting) POSTOP COCHLEAR  IMPLANT 2006  . Seasonal allergies      Social History   Socioeconomic History  . Marital status: Single    Spouse name: Not on file  . Number of children: 0  . Years of education: Not on file  . Highest education level: Not on file  Occupational History  . Occupation: Doctor, hospital for [employer]    Employer: [employer]  Social Needs  . Financial resource strain:  Not on file  . Food insecurity:    Worry: Not on file    Inability: Not on file  . Transportation needs:    Medical: Not on file    Non-medical: Not on file  Tobacco Use  . Smoking status: Former Smoker    Types: Cigarettes    Last attempt to quit: 03/10/2008    Years since quitting: 10.5  . Smokeless tobacco: Never Used  . Tobacco comment: SOCIAL SMOKER IN COLLEGE FOR 61YRS -- NO SMOKING SINCE  Substance and Sexual Activity  . Alcohol use: Yes    Alcohol/week: 3.0 - 5.0 standard drinks    Types: 3 - 5 Glasses of wine per week    Comment: OCCASIONAL   . Drug use: No  . Sexual activity: Yes    Partners: Male    Birth control/protection: Pill  Lifestyle  . Physical activity:    Days per week: Not on file    Minutes per session: Not on file  . Stress: Not on file  Relationships  . Social connections:    Talks on phone: Not on file    Gets together: Not on file    Attends religious service: Not on file    Active member of club or organization: Not on file    Attends meetings of clubs or organizations: Not on file    Relationship status: Not on file  . Intimate partner violence:    Fear of current or ex partner: Not on file    Emotionally abused: Not  on file    Physically abused: Not on file    Forced sexual activity: Not on file  Other Topics Concern  . Not on file  Social History Narrative  . Not on file    Past Surgical History:  Procedure Laterality Date  . BILATERAL MIDDLE EAR EXPLORATION/ SEALING OF OVAL AND ROUND WINDOW FISTULAS  1999 (AGE 32)   PERILYMPH FISTULAS  . CHONDROPLASTY  03/13/2012   Procedure: CHONDROPLASTY;  Surgeon: Shelda Pal, MD;  Location[facility];  Service: Orthopedics;  Laterality: Right;  . COCHLEAR IMPLANT  11-15-2005  (AGE 32)   LEFT EAR  (CLARION HIGH-RESOLUTION 90K MULTICHANNEL,  1-J ELECTRODE)  . KNEE ARTHROSCOPY  03/13/2012   Procedure: ARTHROSCOPY KNEE;  Surgeon: Shelda Pal, MD;  Location: [facility];  Service: Orthopedics;  Laterality: Right;  RIGHT KNEE DIAGNOSTIC AND OPERATIVE SCOPE  . WISDOM TOOTH EXTRACTION  AGE 32   DENTAL OFFICE    Family History  Problem Relation Age of Onset  . Depression Mother   . Hypertension Mother   . Mental illness Mother   . Anxiety disorder Maternal Grandmother     Allergies  Allergen Reactions  . Amoxicillin Hives  . Tdap [Tetanus-Diphth-Acell Pertussis]   . Hydrocodeine [Dihydrocodeine] Rash  . Penicillins Rash    Current Outpatient Medications on File Prior to Visit  Medication Sig Dispense Refill  . APRI 0.15-30 MG-MCG tablet   5  . cetirizine (ZYRTEC) 10 MG tablet Take 10 mg by mouth daily.    Marland Kitchen FLUoxetine (PROZAC) 40 MG capsule Take 1 capsule (40 mg total) by mouth daily. 90 capsule 1  . hydrOXYzine (VISTARIL) 25 MG capsule Take 1 capsule (25 mg total) by mouth 3 (three) times daily as needed. 30 capsule 0  . Multiple Vitamins-Minerals (MULTIVITAMIN WITH MINERALS) tablet Take 1 tablet by mouth daily.    . Probiotic Product (PROBIOTIC DAILY PO) Take by mouth.    . triamcinolone (KENALOG) 0.1 % paste Apply to canker sore twice a day as needed 5 g 2  . valACYclovir (VALTREX) 1000 MG tablet TAKE 2 TABLETS TWICE A DAY FOR 2 DOSES FOR COLD SORES 30 tablet 7   No current facility-administered medications on file prior to visit.     BP 110/81   Pulse 91   Temp 98.9 F (37.2 C) (Oral)   Resp 16   Ht 5' 5.7" (1.669 m)   Wt 187 lb (84.8 kg)   SpO2 99%   BMI 30.46 kg/m       Objective:   Physical Exam   General  Mental Status - Alert. General Appearance - Well groomed. Not in acute distress.  Skin Rashes- No Rashes.  HEENT Head- Normal. Ear Auditory Canal - Left- Normal. Right - Normal.Tympanic Membrane- Left- Normal. Right- Normal. Eye Sclera/Conjunctiva- Left- Normal. Right- Normal. Nose & Sinuses Nasal Mucosa- Left-  Boggy and Congested. Right-  Boggy and  Congested.Bilateral maxillary and frontal sinus  pressure. Mouth & Throat Lips: Upper Lip- Normal: no dryness, cracking, pallor, cyanosis, or vesicular eruption. Lower Lip-Normal: no dryness, cracking, pallor, cyanosis or vesicular eruption. Buccal Mucosa- Bilateral- No Aphthous ulcers. Oropharynx- No Discharge or Erythema. Tonsils: Characteristics- Bilateral- No Erythema or Congestion. Size/Enlargement- Bilateral- No enlargement. Discharge- bilateral-None.  Neck Neck- Supple. No Masses.   Chest and Lung Exam Auscultation: Breath Sounds:-Clear even and unlabored.  Cardiovascular Auscultation:Rythm- Regular, rate and rhythm. Murmurs & Other Heart Sounds:Ausculatation of the heart reveal- No Murmurs.  Lymphatic Head &  Neck General Head & Neck Lymphatics: Bilateral: Description- No Localized lymphadenopathy.      Assessment & Plan:   You appear to have bronchitis and sinusitis. Rest hydrate and tylenol for fever. I am prescribing cough medicine benzonatate, and azithromycin antibiotic. For your nasal congestion rx flonase.  You should gradually get better. If not then notify us and would recommend a chest xray.  Follow up in 7-10 days or as needed  [employer] [employer]

## 2018-10-06 ENCOUNTER — Encounter: Payer: Self-pay | Admitting: Medical

## 2018-10-06 ENCOUNTER — Encounter: Payer: Self-pay | Admitting: Family Medicine

## 2018-10-06 DIAGNOSIS — E663 Overweight: Secondary | ICD-10-CM | POA: Diagnosis not present

## 2018-10-16 ENCOUNTER — Ambulatory Visit (INDEPENDENT_AMBULATORY_CARE_PROVIDER_SITE_OTHER): Payer: BLUE CROSS/BLUE SHIELD | Admitting: Licensed Clinical Social Worker

## 2018-10-16 DIAGNOSIS — F3341 Major depressive disorder, recurrent, in partial remission: Secondary | ICD-10-CM | POA: Diagnosis not present

## 2018-10-22 DIAGNOSIS — H903 Sensorineural hearing loss, bilateral: Secondary | ICD-10-CM | POA: Diagnosis not present

## 2018-10-22 DIAGNOSIS — H933X3 Disorders of bilateral acoustic nerves: Secondary | ICD-10-CM | POA: Diagnosis not present

## 2018-10-29 ENCOUNTER — Ambulatory Visit (INDEPENDENT_AMBULATORY_CARE_PROVIDER_SITE_OTHER): Payer: BLUE CROSS/BLUE SHIELD | Admitting: Licensed Clinical Social Worker

## 2018-10-29 DIAGNOSIS — F3341 Major depressive disorder, recurrent, in partial remission: Secondary | ICD-10-CM

## 2018-10-30 ENCOUNTER — Encounter: Payer: Self-pay | Admitting: Family Medicine

## 2018-10-30 DIAGNOSIS — J0110 Acute frontal sinusitis, unspecified: Secondary | ICD-10-CM

## 2018-10-30 MED ORDER — DOXYCYCLINE HYCLATE 100 MG PO TABS: 100.0000 mg | ORAL_TABLET | Freq: Two times a day (BID) | ORAL | 0 refills | Status: DC

## 2018-10-30 NOTE — Addendum Note (Signed)
Addended by: Abbe AmsterdamOPLAND, JESSICA C on: 10/30/2018 02:02 PM   Modules accepted: Orders

## 2018-11-06 NOTE — H&P (Signed)
Patricia Morse is an 32 y.o. female.   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                  [address]                  [place], [place]   [id number]                                Ph: [phone], Fax: [phone]                  [email or web address]     Provider: Ermalinda Barrios, MD, MS, FACS Encounter Date: Oct 22, 2018 Patient: Patricia Morse, Patricia Morse    ([id number]) Sex: Female DOB: 30-Aug-1986     Age: 43 Year 10 Month 2 Week  Race: White      Address: [address] [place], [place] [address]     Grace Blight. Phone(H): [phone] Primary Dr.: Warner Mccreedy Insurance(s):  BCBS OF (6) (PP)    [place] CT: Type: procedure  code: [phone]  desc:Documentation of current medications (procedure)  Visit Type: [employer], a 32 year 10 month 2 week White female is here today for a pre-operative visit.  Complaint/HPI: The patient was here today with her father for a preoperative evaluation prior to undergoing a right Advanced Bionics cochlear implant that is scheduled for November 12, 2018 at [facility]. She denied any recent upper respiratory tract infection, cough, or fever.  Previous history: The patient was here today with her father for evaluation of a second cochlear implant for her right ear. Patient is well known to me. I implanted her with an Advanced Bionics cochlear implant AS many years ago for treatment of auditory neuropathy. She feels that she has benefited from the implant but would like to have better hearing if possible with the second implant AD. 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 She has not tried a CROS hearing aid with her cochlear implant. She will be working with Victorino Dike for a trial. Her health is otherwise stable.   Current Medication: Other MD:  1. Cymbalta 30 Mg Capsule  2. Zyrtec 10 Mg Liquid Gels   Medical History: Vaccinations: Flu vaccinations: No, patient has not had a flu shot since August 26, 2017. Patient was advised & declined - code [id number].Marland Kitchen Pneumonia vaccination: Yes - code 4040F.  Ear Operations: . Cochlear implant:.  Surgical History: Prior surgeries include knee arthroscopy.  Family History: The patient has a family history of Diabetes mellitus and Heart disease.  Social History: Smoking: Her current smoking status is never smoker/non-smoker. Alcohol: Patient drinks alcoholic beverages. Marital Status: Patient is single. HIV status: (-) HIV status. Noise exposure: Patient denies noise exposure.  Allergy: Penicillins, Betalactams  ROS: General: (-) fever, (-) chills, (-) night sweats, (-) fatigue, (-) weakness, (-) changes in appetite or weight. (-) allergies, (-) not immunocompromised. Head: (-) headaches, (-) head injury or deformity. Eyes: (-) visual changes, (-) eye pain, (-) eye discharges, (-) redness, (-) itching, (-)  excessive tearing, (-) double or blurred vision, (-) glaucoma, (-) cataracts. Ears: (+) hearing loss. Nose and Sinuses: (-) frequent colds, (-) nasal stuffiness or itchiness, (-) postnasal drip, (-) hay fever, (-) nosebleeds, (-) sinus trouble. Mouth and Throat: (-) bleeding gums, (-) toothache, (-) odd taste sensations, (-) sores on tongue, (-) frequent sore throat, (-) hoarseness. Neck: (-) swollen glands, (-) enlarged thyroid, (-) neck pain. Cardiac: (-) chest pain, (-) edema, (-) high blood pressure, (-) irregular heartbeat, (-) orthopnea, (-) palpitations, (-) paroxysmal nocturnal dyspnea, (-) shortness of breath. Respiratory: (-) cough, (-) hemoptysis, (-)  shortness of breath, (-) cyanosis, (-) wheezing, (-) nocturnal choking or gasping, (-) TB exposure. Breasts: (-) nipple discharge, (-) breast lumps, (-) breast pain. Gastrointestinal: (-) abdominal pain, (-) heartburn, (-) constipation, (-) diarrhea, (-) nausea, (-) vomiting, (-) hematochezia, (-) melena, (-) change in bowel habits. Urinary: (-) dysuria, (-) frequency, (-) urgency, (-) hesitancy, (-) polyuria, (-) nocturia, (-) hematuria, (-) urinary incontinence, (-) flank pain, (-) change in urinary habits. Gynecologic/Urologic: (-) genital sores or lesions, (-) history of STD, (-) sexual difficulties. Musculoskeletal: (-) muscle pain, (-) joint pain, (-) bone pain. Peripheral Vascular: (-) intermittent claudication, (-) cramps, (-) varicose veins, (-) thrombophlebitis. Neurological: (-) numbness, (-) tingling, (-) tremors, (-) seizures, (-) vertigo, (-) dizziness, (-) memory loss, (-) any focal or diffuse neurological deficits. Psychiatric: (+) anxiety, (+) depression. Endocrine: (-) heat or cold intolerance, (-) excessive sweating, (-) diabetes, (-) excessive thirst, (-) excessive hunger, (-) excessive urination, (-) hirsutism, (-) change in ring or shoe size. Hematologic/Lymphatic: (-) anemia, (-) easy bruising, (-) excessive bleeding, (-) history of blood transfusions. Skin: (-) rashes, (-) lumps, (-) itching, (-) dryness, (-) acne, (-) discoloration, (-) recurrent skin infections, (-) changes in hair, nails or moles.  Vital Signs: Weight:   84.368 kgs Height:   5\' 5"  BMI:   30.95 BSA:   1.97 BP:   126/83  Examination: Prior to the examination, I have reviewed: (1) the patient's current medications and allergies, (2) medical, family, and social histories, (3) review of systems, and (4) vital signs.  General Appearance - Adult: The patient is a well-developed, well-nourished, female, has no recognizable syndromes or patterns of malformation, and is in no acute distress. She is awake,  alert, coherent, spontaneous, and logical. She is oriented to time, place, and person and communicates without difficulty.  Head: The patient's head was normocephalic and without any evidence of trauma or lesions.  Face: Her facial motion was intact and symmetric bilaterally with normal resting facial tone and voluntary facial power.  Skin: Gross inspection of her facial skin demonstrated no evidence of abnormality.  Eyes: Her pupils are equal, regular, reactive to light and accommodate (PERRLA). Extraocular movements were intact (EOMI). Conjunctivae were normal. There was no sclera icterus. There was no nystagmus. Eyelids appeared normal. There was no ptosis, lid lag, lid edema, or lagophthalmos.  External ears: Examination of the external ears revealed Well healed left cochlear implant incision.  External auditory canals: Her external auditory canal was normal in diameter and had intact, healthy skin. There were no signs of infection, exposed bone, or canal cholesteatoma. Minimal cerumen was removed to facilitate examination.  Right Tympanic Membrane: The right tympanic membrane was clear and mobile. There was an air containing right middle ear space.  Left Tympanic Membrane: Some atrophic changes of her left tympanic membrane but the TM is intact and there is no evidence of infection.  Respiratory: Her lungs were clear to auscultation  and percussion. There were no wheezes, rales, rubs or rhonchi noted.  Cardiovascular: Her PMI was nondisplaced and was normal in character. There were no heaves or abnormal pulsations. She was in normal sinus rhythm with normal S1 and S2 sounds without any audible clicks, murmurs or rubs.  Audiology Procedures: Audiogram/Graph Audiogram: I have referred the patient for audiometric testing. I have reviewed the patient's audiogram. (EMK).  Procedure:  Patient was placed in a special soundproof testing booth with earphones. Sounds were passed through the  earphones. Each ear was tested separately. The patient was asked which part of the ear sound was heard. Earphones were removed, bone conduction hearing was then tested by having a vibrating instrument held close against the patient's ear. Patient was found to have a severe upsloping sensorineural hearing loss in the right ear with an SRT of 100 DB and 4% discrimination. She has no hearing in the left ear.  Impression: Other:  1. Bilateral auditory neuropathy. Status post successful left ABC CI. 2. Patient was found to have a severe upsloping sensorineural hearing loss in the right ear with an SRT of 100 DB and 4% discrimination. She has no hearing in the left ear. 3. Patient has been recommended for a right ABC Ultra 3-D CI, four hours, [facility], general anesthesia, with a 23 hour overnight observation in recovery care. Risks, complications, and alternatives of the procedure were explained to the patient and to her father. Questions were invited and answered. Informed consent was read and sign and witnessed. The patient was given a copy of our cochlear implant informed consent form. Preoperative teaching and counseling were provided. I answered all of the patient's questions. 4 She has had a Pneumovax vaccine. 5. The procedure is scheduled for Wednesday, November 12, 2018 at [facility].  Plan: Clinical summary letter made available to patient today. This letter may not be complete at time of service. Please contact our office within 3 days for a completed summary of today's visit.  Status: stable. Medications: None required.  Diet: no restriction. Procedure: Cochlear implant: Right ear. Duration:  4 hours. Surgeon: Carolan Shiver MD Office Phone: [phone] Office Fax: [phone] Cell Phone: [phone]. Anesthesia Required: General. Equipment:  Mastoid instruments NIM monitor Stryker drill. Implants:  Cochlear implant - Advanced Bionics Ultra 3D, 90K  HiRes with 1j electrode. Recovery Care Center: yes. Latex Allergy: no.  Informed consent: Informed consent for a cochlear implant was provided. The consent was provided in an office examination room. The patient father was given our written Cochlear Implant Informed Consent to read because they could not hear. The recommended procedure(s) was/were explained to the patient father. Advantages, disadvantages, and options were reviewed, including doing nothing. Questions were invited and answered. Preoperative teaching and counseling were provided. The time and date of the procedure were reviewed. No guarantees were implied or made that cochlear implantation would be able to provide any hearing or useful hearing. Informed consent - status: Informed consent was provided and was signed and witnessed. Follow-Up: Postoperative visit as scheduled.  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  We have noticed an issue with your BMI (Body Mass Index). We recommend that you contact your family physician. (2) a Hypertension/Hypotension - HNS16 (3) Hearing Loss (4) Postop Tympanomastoid/cochlear implant  Followup: Postop visit -  11-23-18 at 2:10 pm for follow up and staple removal            Past Medical History:  Diagnosis Date  . Allergy   . Auditory neuropathy BILATERAL  . Bilateral sensorineural hearing loss    SECONDARY AUDITORY NEUROPATHY  . 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 Depression   . 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 Internal derangement of right knee   . PONV (postoperative nausea and vomiting) POSTOP COCHLEAR  IMPLANT 2006  . Seasonal allergies     Past Surgical History:  Procedure Laterality Date  . BILATERAL MIDDLE EAR EXPLORATION/ SEALING OF OVAL AND ROUND WINDOW  FISTULAS  1999 (AGE 26)   PERILYMPH FISTULAS  . CHONDROPLASTY  03/13/2012   Procedure: CHONDROPLASTY;  Surgeon: Shelda Pal, MD;  Location: [facility];  Service: Orthopedics;  Laterality: Right;  . COCHLEAR IMPLANT  11-15-2005  (AGE 20)   LEFT EAR  (CLARION HIGH-RESOLUTION 90K MULTICHANNEL,  1-J ELECTRODE)  . KNEE ARTHROSCOPY  03/13/2012   Procedure: ARTHROSCOPY KNEE;  Surgeon: Shelda Pal, MD;  Location: [facility];  Service: Orthopedics;  Laterality: Right;  RIGHT KNEE DIAGNOSTIC AND OPERATIVE SCOPE  . WISDOM TOOTH EXTRACTION  AGE 76   DENTAL OFFICE    Family History  Problem Relation Age of Onset  . Depression Mother   . Hypertension Mother   . Mental illness Mother   . Anxiety disorder Maternal Grandmother    Social History:  reports that she quit smoking about 10 years ago. Her smoking use included cigarettes. She has never used smokeless tobacco. She reports current alcohol use of about 3.0 - 5.0 standard drinks of alcohol per week. She reports that she does not use drugs.  Allergies:  Allergies  Allergen Reactions  . Amoxicillin Hives  . Tdap [Tetanus-Diphth-Acell Pertussis]   . Hydrocodeine [Dihydrocodeine] Rash  . Penicillins Rash    No medications prior to admission.    No results found for this or any previous visit (from the past 48 hour(s)). No results found.  Review of Systems  Constitutional: Negative.   HENT: Positive for hearing loss.   Eyes: Negative.   Respiratory: Negative.   Cardiovascular: Negative.   Gastrointestinal: Negative.   Genitourinary: Negative.   Musculoskeletal: Negative.   Skin: Negative.   Neurological: Negative.   Endo/Heme/Allergies: Negative.     There were no vitals taken for this visit. Physical Exam   Assessment/Plan 1. 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 Status post successful left ABC cochlear implant 3. The patient has  been recommended for a right ABC Ultra 3-D Hi-Res cochlear implant, three hours, general endotracheal anesthesia, 23 hour overnight observation, at [facility]. Risks, complications, and alternatives of the procedure had been explained to the patient and to her father. Questions have been invited and answered. Our cochlear implant informed consent form has been read by the patient and by her father and has been witnessed. The patient was given a copy of the written, signed and witnessed informed consent. Preoperative teaching and counseling have been provided. Our audiologists have also extensively counseled the patient concerning postoperative expectations. 4. The procedure has been scheduled for November 12, 2018 at 8:30 AM at [facility].  Ermalinda Barrios, MD 11/06/2018, 9:07 AM

## 2018-11-07 DIAGNOSIS — F331 Major depressive disorder, recurrent, moderate: Secondary | ICD-10-CM | POA: Diagnosis not present

## 2018-11-10 ENCOUNTER — Other Ambulatory Visit: Payer: Self-pay

## 2018-11-10 ENCOUNTER — Encounter (HOSPITAL_BASED_OUTPATIENT_CLINIC_OR_DEPARTMENT_OTHER): Payer: Self-pay | Admitting: *Deleted

## 2018-11-12 ENCOUNTER — Ambulatory Visit (HOSPITAL_COMMUNITY): Payer: BLUE CROSS/BLUE SHIELD

## 2018-11-12 ENCOUNTER — Encounter (HOSPITAL_BASED_OUTPATIENT_CLINIC_OR_DEPARTMENT_OTHER)
Admission: RE | Disposition: A | Discharge status: Home / Self Care | Payer: Self-pay | Source: Home / Self Care | Attending: Otolaryngology

## 2018-11-12 ENCOUNTER — Ambulatory Visit (HOSPITAL_BASED_OUTPATIENT_CLINIC_OR_DEPARTMENT_OTHER): Payer: BLUE CROSS/BLUE SHIELD | Admitting: Anesthesiology

## 2018-11-12 ENCOUNTER — Other Ambulatory Visit: Payer: Self-pay

## 2018-11-12 ENCOUNTER — Ambulatory Visit (HOSPITAL_BASED_OUTPATIENT_CLINIC_OR_DEPARTMENT_OTHER)
Admission: RE | Admit: 2018-11-12 | Discharge: 2018-11-13 | Disposition: A | Discharge status: Home / Self Care | Payer: BLUE CROSS/BLUE SHIELD | Attending: Otolaryngology | Admitting: Otolaryngology

## 2018-11-12 ENCOUNTER — Encounter (HOSPITAL_BASED_OUTPATIENT_CLINIC_OR_DEPARTMENT_OTHER): Payer: Self-pay | Admitting: *Deleted

## 2018-11-12 DIAGNOSIS — Z87891 Personal history of nicotine dependence: Secondary | ICD-10-CM | POA: Diagnosis not present

## 2018-11-12 DIAGNOSIS — Z880 Allergy status to penicillin: Secondary | ICD-10-CM | POA: Diagnosis not present

## 2018-11-12 DIAGNOSIS — H903 Sensorineural hearing loss, bilateral: Secondary | ICD-10-CM | POA: Diagnosis not present

## 2018-11-12 DIAGNOSIS — Z9621 Cochlear implant status: Secondary | ICD-10-CM | POA: Diagnosis not present

## 2018-11-12 DIAGNOSIS — Z888 Allergy status to other drugs, medicaments and biological substances status: Secondary | ICD-10-CM | POA: Diagnosis not present

## 2018-11-12 DIAGNOSIS — F329 Major depressive disorder, single episode, unspecified: Secondary | ICD-10-CM | POA: Diagnosis not present

## 2018-11-12 DIAGNOSIS — Z79899 Other long term (current) drug therapy: Secondary | ICD-10-CM | POA: Diagnosis not present

## 2018-11-12 DIAGNOSIS — G629 Polyneuropathy, unspecified: Secondary | ICD-10-CM | POA: Diagnosis not present

## 2018-11-12 DIAGNOSIS — H933X3 Disorders of bilateral acoustic nerves: Secondary | ICD-10-CM | POA: Diagnosis not present

## 2018-11-12 DIAGNOSIS — Z419 Encounter for procedure for purposes other than remedying health state, unspecified: Secondary | ICD-10-CM

## 2018-11-12 DIAGNOSIS — Z4889 Encounter for other specified surgical aftercare: Secondary | ICD-10-CM

## 2018-11-12 HISTORY — PX: COCHLEAR IMPLANT: SHX184

## 2018-11-12 LAB — POCT PREGNANCY, URINE: Preg Test, Ur: NEGATIVE

## 2018-11-12 SURGERY — INSERTION, IMPLANT, COCHLEAR
Anesthesia: General | Site: Ear | Laterality: Right

## 2018-11-12 MED ORDER — SODIUM CHLORIDE 0.9 % IV SOLN
INTRAVENOUS | Status: DC | PRN
  Administered 2018-11-12: 40 ug/min via INTRAVENOUS

## 2018-11-12 MED ORDER — ONDANSETRON HCL 4 MG/2ML IJ SOLN: 4.0000 mg | INTRAMUSCULAR | Status: DC

## 2018-11-12 MED ORDER — ONDANSETRON HCL 4 MG PO TABS
4.0000 mg | ORAL_TABLET | ORAL | Status: DC | PRN
  Administered 2018-11-13: 4 mg via ORAL
  Filled 2018-11-12: qty 1

## 2018-11-12 MED ORDER — LIDOCAINE-EPINEPHRINE 1 %-1:100000 IJ SOLN
INTRAMUSCULAR | Status: DC | PRN
  Administered 2018-11-12: 9 mL

## 2018-11-12 MED ORDER — LIDOCAINE-EPINEPHRINE 1 %-1:100000 IJ SOLN
INTRAMUSCULAR | Status: AC
  Filled 2018-11-12: qty 1

## 2018-11-12 MED ORDER — DEXAMETHASONE SODIUM PHOSPHATE 4 MG/ML IJ SOLN
INTRAMUSCULAR | Status: DC | PRN
  Administered 2018-11-12: 10 mg via INTRAVENOUS

## 2018-11-12 MED ORDER — ARTIFICIAL TEARS OPHTHALMIC OINT
TOPICAL_OINTMENT | OPHTHALMIC | Status: AC
  Filled 2018-11-12: qty 3.5

## 2018-11-12 MED ORDER — FENTANYL CITRATE (PF) 100 MCG/2ML IJ SOLN
INTRAMUSCULAR | Status: AC
  Filled 2018-11-12: qty 2

## 2018-11-12 MED ORDER — FENTANYL CITRATE (PF) 100 MCG/2ML IJ SOLN: 25.0000 ug | INTRAMUSCULAR | Status: DC | PRN

## 2018-11-12 MED ORDER — SCOPOLAMINE 1 MG/3DAYS TD PT72
MEDICATED_PATCH | TRANSDERMAL | Status: AC
  Filled 2018-11-12: qty 1

## 2018-11-12 MED ORDER — SUCCINYLCHOLINE CHLORIDE 200 MG/10ML IV SOSY
PREFILLED_SYRINGE | INTRAVENOUS | Status: AC
  Filled 2018-11-12: qty 10

## 2018-11-12 MED ORDER — BACITRACIN ZINC 500 UNIT/GM EX OINT
TOPICAL_OINTMENT | CUTANEOUS | Status: AC
  Filled 2018-11-12: qty 28.35

## 2018-11-12 MED ORDER — SODIUM CHLORIDE 0.9 % IV SOLN
INTRAVENOUS | Status: DC | PRN
  Administered 2018-11-12: 500 mL

## 2018-11-12 MED ORDER — LIDOCAINE HCL (CARDIAC) PF 100 MG/5ML IV SOSY
PREFILLED_SYRINGE | INTRAVENOUS | Status: DC | PRN
  Administered 2018-11-12: 80 mg via INTRAVENOUS

## 2018-11-12 MED ORDER — MIDAZOLAM HCL 2 MG/2ML IJ SOLN
INTRAMUSCULAR | Status: AC
  Filled 2018-11-12: qty 2

## 2018-11-12 MED ORDER — ONDANSETRON HCL 4 MG/2ML IJ SOLN
INTRAMUSCULAR | Status: DC | PRN
  Administered 2018-11-12 (×2): 4 mg via INTRAVENOUS

## 2018-11-12 MED ORDER — CLINDAMYCIN PHOSPHATE 300 MG/50ML IV SOLN
INTRAVENOUS | Status: AC
  Filled 2018-11-12: qty 50

## 2018-11-12 MED ORDER — EPINEPHRINE PF 1 MG/ML IJ SOLN
INTRAMUSCULAR | Status: DC | PRN
  Administered 2018-11-12: 1 mL

## 2018-11-12 MED ORDER — PROPOFOL 500 MG/50ML IV EMUL
INTRAVENOUS | Status: DC | PRN
  Administered 2018-11-12: 25 ug/kg/min via INTRAVENOUS

## 2018-11-12 MED ORDER — CIPROFLOXACIN-HYDROCORTISONE 0.2-1 % OT SUSP
OTIC | Status: DC | PRN
  Administered 2018-11-12: 3 [drp]

## 2018-11-12 MED ORDER — EPINEPHRINE 30 MG/30ML IJ SOLN
INTRAMUSCULAR | Status: AC
  Filled 2018-11-12: qty 1

## 2018-11-12 MED ORDER — TRAMADOL HCL 50 MG PO TABS
ORAL_TABLET | ORAL | Status: AC
  Filled 2018-11-12: qty 1

## 2018-11-12 MED ORDER — CIPROFLOXACIN-HYDROCORTISONE 0.2-1 % OT SUSP
OTIC | Status: AC
  Filled 2018-11-12: qty 10

## 2018-11-12 MED ORDER — PROMETHAZINE HCL 25 MG/ML IJ SOLN: 6.2500 mg | INTRAMUSCULAR | Status: DC | PRN

## 2018-11-12 MED ORDER — ONDANSETRON HCL 4 MG/2ML IJ SOLN
INTRAMUSCULAR | Status: AC
  Filled 2018-11-12: qty 2

## 2018-11-12 MED ORDER — ZOLPIDEM TARTRATE 5 MG PO TABS: 5.0000 mg | ORAL_TABLET | Freq: Every evening | ORAL | Status: DC | PRN

## 2018-11-12 MED ORDER — CLINDAMYCIN PHOSPHATE 300 MG/50ML IV SOLN
300.0000 mg | Freq: Three times a day (TID) | INTRAVENOUS | Status: DC
  Administered 2018-11-12 – 2018-11-13 (×2): 300 mg via INTRAVENOUS

## 2018-11-12 MED ORDER — SUCCINYLCHOLINE CHLORIDE 20 MG/ML IJ SOLN
INTRAMUSCULAR | Status: DC | PRN
  Administered 2018-11-12: 100 mg via INTRAVENOUS

## 2018-11-12 MED ORDER — DEXAMETHASONE SODIUM PHOSPHATE 4 MG/ML IJ SOLN
10.0000 mg | INTRAMUSCULAR | Status: AC
  Administered 2018-11-12: 10 mg via INTRAVENOUS

## 2018-11-12 MED ORDER — IBUPROFEN 100 MG/5ML PO SUSP
400.0000 mg | Freq: Four times a day (QID) | ORAL | Status: DC | PRN
  Administered 2018-11-12 – 2018-11-13 (×2): 400 mg via ORAL

## 2018-11-12 MED ORDER — SCOPOLAMINE 1 MG/3DAYS TD PT72
1.0000 | MEDICATED_PATCH | Freq: Once | TRANSDERMAL | Status: DC | PRN
  Administered 2018-11-12: 1.5 mg via TRANSDERMAL

## 2018-11-12 MED ORDER — PROPOFOL 10 MG/ML IV BOLUS
INTRAVENOUS | Status: DC | PRN
  Administered 2018-11-12: 150 mg via INTRAVENOUS

## 2018-11-12 MED ORDER — TRAMADOL HCL 50 MG PO TABS
50.0000 mg | ORAL_TABLET | Freq: Four times a day (QID) | ORAL | Status: DC | PRN
  Administered 2018-11-12 – 2018-11-13 (×3): 50 mg via ORAL
  Filled 2018-11-12 (×2): qty 1

## 2018-11-12 MED ORDER — CLINDAMYCIN PHOSPHATE 900 MG/50ML IV SOLN
INTRAVENOUS | Status: DC | PRN
  Administered 2018-11-12: 900 mg via INTRAVENOUS

## 2018-11-12 MED ORDER — CLINDAMYCIN PHOSPHATE 900 MG/50ML IV SOLN: 900.0000 mg | INTRAVENOUS | Status: DC

## 2018-11-12 MED ORDER — ONDANSETRON HCL 4 MG/2ML IJ SOLN: 4.0000 mg | INTRAMUSCULAR | Status: DC | PRN

## 2018-11-12 MED ORDER — LACTATED RINGERS IV SOLN
INTRAVENOUS | Status: DC
  Administered 2018-11-12 (×3): via INTRAVENOUS

## 2018-11-12 MED ORDER — LIDOCAINE 2% (20 MG/ML) 5 ML SYRINGE
INTRAMUSCULAR | Status: AC
  Filled 2018-11-12: qty 5

## 2018-11-12 MED ORDER — CLINDAMYCIN PHOSPHATE 900 MG/50ML IV SOLN
INTRAVENOUS | Status: AC
  Filled 2018-11-12: qty 50

## 2018-11-12 MED ORDER — DEXAMETHASONE SODIUM PHOSPHATE 10 MG/ML IJ SOLN
8.0000 mg | Freq: Three times a day (TID) | INTRAMUSCULAR | Status: DC
  Administered 2018-11-12 – 2018-11-13 (×2): 8 mg via INTRAVENOUS
  Filled 2018-11-12 (×2): qty 1

## 2018-11-12 MED ORDER — DEXTROSE IN LACTATED RINGERS 5 % IV SOLN
INTRAVENOUS | Status: DC
  Administered 2018-11-12: 14:00:00 via INTRAVENOUS

## 2018-11-12 MED ORDER — IBUPROFEN 100 MG/5ML PO SUSP
ORAL | Status: AC
  Filled 2018-11-12: qty 5

## 2018-11-12 MED ORDER — METHYLENE BLUE 0.5 % INJ SOLN
INTRAVENOUS | Status: AC
  Filled 2018-11-12: qty 10

## 2018-11-12 MED ORDER — METHYLENE BLUE 0.5 % INJ SOLN
INTRAVENOUS | Status: DC | PRN
  Administered 2018-11-12: 1 mL via SUBMUCOSAL

## 2018-11-12 MED ORDER — FENTANYL CITRATE (PF) 100 MCG/2ML IJ SOLN
50.0000 ug | INTRAMUSCULAR | Status: AC | PRN
  Administered 2018-11-12: 50 ug via INTRAVENOUS
  Administered 2018-11-12 (×2): 25 ug via INTRAVENOUS
  Administered 2018-11-12: 50 ug via INTRAVENOUS

## 2018-11-12 MED ORDER — MIDAZOLAM HCL 2 MG/2ML IJ SOLN
1.0000 mg | INTRAMUSCULAR | Status: DC | PRN
  Administered 2018-11-12: 2 mg via INTRAVENOUS

## 2018-11-12 MED ORDER — MUPIROCIN 2 % EX OINT
1.0000 "application " | TOPICAL_OINTMENT | Freq: Every day | CUTANEOUS | Status: DC
  Administered 2018-11-13: 1 via TOPICAL

## 2018-11-12 MED ORDER — DEXAMETHASONE SODIUM PHOSPHATE 10 MG/ML IJ SOLN
INTRAMUSCULAR | Status: AC
  Filled 2018-11-12: qty 3

## 2018-11-12 SURGICAL SUPPLY — 89 items
APL SKNCLS STERI-STRIP NONHPOA (GAUZE/BANDAGES/DRESSINGS)
ATTRACTOMAT 16X20 MAGNETIC DRP (DRAPES) IMPLANT
BAG DECANTER FOR FLEXI CONT (MISCELLANEOUS) ×2 IMPLANT
BALL CTTN LRG ABS STRL LF (GAUZE/BANDAGES/DRESSINGS) ×1
BENZOIN TINCTURE PRP APPL 2/3 (GAUZE/BANDAGES/DRESSINGS) IMPLANT
BLADE CLIPPER SURG (BLADE) ×2 IMPLANT
BLADE NDL 3 SS STRL (BLADE) ×1 IMPLANT
BLADE NEEDLE 3 SS STRL (BLADE) ×2 IMPLANT
BUR ROUNG CUTTER 5.0 (BUR) ×1 IMPLANT
BUR SABER RD CUTTING 3.0 (BURR) ×1 IMPLANT
BUR SABER TAPERED DIAMOND 1 (BURR) ×1 IMPLANT
BUR SABER TAPERED DIAMOND 2 (BURR) ×1 IMPLANT
CANISTER SUCT 1200ML W/VALVE (MISCELLANEOUS) ×2 IMPLANT
CORD BIPOLAR FORCEPS 12FT (ELECTRODE) ×2 IMPLANT
COTTONBALL LRG STERILE PKG (GAUZE/BANDAGES/DRESSINGS) ×2 IMPLANT
COVER PROBE 5X48 (MISCELLANEOUS) ×2
COVER WAND RF STERILE (DRAPES) IMPLANT
DECANTER SPIKE VIAL GLASS SM (MISCELLANEOUS) ×1 IMPLANT
DEPRESSOR TONGUE BLADE STERILE (MISCELLANEOUS) ×2 IMPLANT
DRAIN JACKSON RD 7FR 3/32 (WOUND CARE) IMPLANT
DRAIN PENROSE 1/4X12 LTX STRL (WOUND CARE) IMPLANT
DRAPE C-ARM 42X72 X-RAY (DRAPES) ×2 IMPLANT
DRAPE INCISE IOBAN 66X45 STRL (DRAPES) ×2 IMPLANT
DRAPE MICROSCOPE WILD 40.5X102 (DRAPES) ×2 IMPLANT
DRAPE SURG 17X23 STRL (DRAPES) ×2 IMPLANT
DRSG GLASSCOCK MASTOID ADT (GAUZE/BANDAGES/DRESSINGS) ×2 IMPLANT
ELECT COATED BLADE 2.86 ST (ELECTRODE) ×2 IMPLANT
ELECT PAIRED SUBDERMAL (MISCELLANEOUS) ×2
ELECT REM PT RETURN 9FT ADLT (ELECTROSURGICAL) ×2
ELECTRODE PAIRED SUBDERMAL (MISCELLANEOUS) ×1 IMPLANT
ELECTRODE REM PT RTRN 9FT ADLT (ELECTROSURGICAL) ×1 IMPLANT
GAUZE 4X4 16PLY RFD (DISPOSABLE) ×1 IMPLANT
GAUZE SPONGE 4X4 12PLY STRL (GAUZE/BANDAGES/DRESSINGS) IMPLANT
GLOVE BIO SURGEON STRL SZ 6.5 (GLOVE) ×3 IMPLANT
GLOVE BIO SURGEON STRL SZ7 (GLOVE) ×1 IMPLANT
GLOVE BIOGEL PI IND STRL 7.0 (GLOVE) IMPLANT
GLOVE BIOGEL PI INDICATOR 7.0 (GLOVE) ×3
GLOVE ECLIPSE 7.5 STRL STRAW (GLOVE) ×4 IMPLANT
GOWN STRL REUS W/ TWL LRG LVL3 (GOWN DISPOSABLE) ×1 IMPLANT
GOWN STRL REUS W/ TWL XL LVL3 (GOWN DISPOSABLE) ×1 IMPLANT
GOWN STRL REUS W/TWL LRG LVL3 (GOWN DISPOSABLE) ×2
GOWN STRL REUS W/TWL XL LVL3 (GOWN DISPOSABLE) ×2
IMPL COCHLEAR ULTRA 3D (Prosthesis and Implant ENT) IMPLANT
IMPLANT COCHLEAR ULTRA 3D (Prosthesis and Implant ENT) ×2 IMPLANT
IV NS 500ML (IV SOLUTION) ×2
IV NS 500ML BAXH (IV SOLUTION) ×1 IMPLANT
IV NS IRRIG 3000ML ARTHROMATIC (IV SOLUTION) ×2 IMPLANT
IV SET EXT 30 76VOL 4 MALE LL (IV SETS) ×2 IMPLANT
KIT CVR 48X5XPRB PLUP LF (MISCELLANEOUS) IMPLANT
MARKER SKIN DUAL TIP RULER LAB (MISCELLANEOUS) ×1 IMPLANT
NDL HYPO 18GX1.5 BLUNT FILL (NEEDLE) ×1 IMPLANT
NDL HYPO 25X1 1.5 SAFETY (NEEDLE) ×2 IMPLANT
NDL SAFETY ECLIPSE 18X1.5 (NEEDLE) ×1 IMPLANT
NDL SPNL 25GX3.5 QUINCKE BL (NEEDLE) ×1 IMPLANT
NEEDLE HYPO 18GX1.5 BLUNT FILL (NEEDLE) ×2 IMPLANT
NEEDLE HYPO 18GX1.5 SHARP (NEEDLE) ×2
NEEDLE HYPO 22GX1.5 SAFETY (NEEDLE) ×2 IMPLANT
NEEDLE HYPO 25X1 1.5 SAFETY (NEEDLE) ×4 IMPLANT
NEEDLE SPNL 25GX3.5 QUINCKE BL (NEEDLE) ×2 IMPLANT
NS IRRIG 1000ML POUR BTL (IV SOLUTION) ×2 IMPLANT
PACK BASIN DAY SURGERY FS (CUSTOM PROCEDURE TRAY) ×2 IMPLANT
PACK ENT DAY SURGERY (CUSTOM PROCEDURE TRAY) ×2 IMPLANT
PATTIES SURGICAL .25X.25 (GAUZE/BANDAGES/DRESSINGS) ×2 IMPLANT
PENCIL BUTTON HOLSTER BLD 10FT (ELECTRODE) ×2 IMPLANT
PIN SAFETY STERILE (MISCELLANEOUS) IMPLANT
PROBE NERVBE PRASS .33 (MISCELLANEOUS) ×2 IMPLANT
PROCESSOR DUAL CI M90 3D NAIDA (MISCELLANEOUS) ×1 IMPLANT
SET IRRIG Y TYPE TUR BLADDER L (SET/KITS/TRAYS/PACK) ×2 IMPLANT
SLEEVE SCD COMPRESS KNEE MED (MISCELLANEOUS) ×2 IMPLANT
SPONGE NEURO XRAY DETECT 1X3 (DISPOSABLE) ×1 IMPLANT
SPONGE SURGIFOAM ABS GEL 100 (HEMOSTASIS) ×2 IMPLANT
SPONGE SURGIFOAM ABS GEL 12-7 (HEMOSTASIS) ×2 IMPLANT
STAPLER VISISTAT (STAPLE) ×2 IMPLANT
STRIP CLOSURE SKIN 1/2X4 (GAUZE/BANDAGES/DRESSINGS) ×2 IMPLANT
SUT BONE WAX W31G (SUTURE) ×2 IMPLANT
SUT CHROMIC 3 0 PS 2 (SUTURE) ×2 IMPLANT
SUT CHROMIC 3 0 SH 27 (SUTURE) ×2 IMPLANT
SUT ETHILON 5 0 PS 2 18 (SUTURE) IMPLANT
SUT NOVAFIL 5 0 BLK 18 IN P13 (SUTURE) IMPLANT
SYR 3ML 23GX1 SAFETY (SYRINGE) ×2 IMPLANT
SYR BULB 3OZ (MISCELLANEOUS) ×2 IMPLANT
SYR TB 1ML LL NO SAFETY (SYRINGE) ×4 IMPLANT
TOWEL GREEN STERILE FF (TOWEL DISPOSABLE) ×4 IMPLANT
TOWEL OR NON WOVEN STRL DISP B (DISPOSABLE) ×2 IMPLANT
TRAY DSU PREP LF (CUSTOM PROCEDURE TRAY) ×2 IMPLANT
TRAY FOL W/BAG SLVR 16FR STRL (SET/KITS/TRAYS/PACK) IMPLANT
TRAY FOLEY W/BAG SLVR 14FR LF (SET/KITS/TRAYS/PACK) ×1 IMPLANT
TRAY FOLEY W/BAG SLVR 16FR LF (SET/KITS/TRAYS/PACK)
TUBING IRRIGATION (MISCELLANEOUS) ×2 IMPLANT

## 2018-11-12 NOTE — Anesthesia Procedure Notes (Signed)
Procedure Name: Intubation Date/Time: 11/12/2018 8:42 AM Performed by: Lyndee Leo, CRNA Pre-anesthesia Checklist: Patient identified, Emergency Drugs available, Suction available and Patient being monitored Patient Re-evaluated:Patient Re-evaluated prior to induction Oxygen Delivery Method: Circle system utilized Preoxygenation: Pre-oxygenation with 100% oxygen Induction Type: IV induction Ventilation: Mask ventilation without difficulty Laryngoscope Size: Mac and 3 Grade View: Grade I Tube type: Oral Tube size: 8.0 mm Number of attempts: 2 Airway Equipment and Method: Stylet and Oral airway Placement Confirmation: ETT inserted through vocal cords under direct vision,  positive ETCO2 and breath sounds checked- equal and bilateral Tube secured with: Tape Dental Injury: Teeth and Oropharynx as per pre-operative assessment  Comments: Glidescope attempt for confirmation of placement

## 2018-11-12 NOTE — Op Note (Signed)
NAME: Patricia Morse.  Patricia Morse                                                                ACCOUNT NO.: [id number]   MEDICAL RECORD NO.: [id number] LOCATION:  [facility]                       FACILITY: [facility]   PHYSICIAN:  Carolan Shiver, M.D., M.S., F.A.C.S.       DATE OF BIRTH: [birth year],   32 y.o.   DATE OF PROCEDURE: November 12, 2018 DATE OF DISCHARGE:   November 13, 2018    OPERATIVE REPORT     JUSTIFICATION FOR PROCEDURE:   The patient is a 32 year old white female who was born with auditory neuropathy.  She underwent a successful left ABC high-res 90 K cochlear implant years ago and desired to have a right ABC cochlear implant to achieve binaural hearing.  She has done well with her left cochlear implant.  She was recommended for a right Advanced Bionics High-Res ultra 3D cochlear implant under general anesthesia at [facility].  Risks, complications, and alternatives of the procedure were explained to the patient and to her father.  Questions were invited and answered.  Informed consent was read, signed and witnessed.  The operative teaching and counseling were provided.  She was very familiar with the procedure has she had undergone a left cochlear implant in the past.    JUSTIFICATION FOR OUTPATIENT SETTING:  The patient's age and need for general endotracheal anesthesia.   JUSTIFICATION FOR OVERNIGHT STAY:  This 30 hours of observation following a right cochlear implant.   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     OPERATION: Right Advanced Bionics High-Res Ultra 3D with mid scalar electrode cochlear implant (MRI compatible)    SURGEON:  Carolan Shiver, M.D., M.S., F.A.C.S.   ANESTHESIA:  General endotracheal, Dr. Adonis Huguenin,  CRNA = Mingo Amber, CRNA   COMPLICATIONS:  None.   SUMMARY OF OPERATION:  After the patient was taken to operating room #6 at [facility], she was placed in the supine position.  An IV had been begun in the holding area.  A general IV induction was performed by Dr. Krista Blue, and the patient was orally intubated without difficulty by Mingo Amber, CRNA.    She was properly positioned and monitored.  Elbows and ankles were padded with foam rubber, and I initiated a time-out at 8:46 am.   The patient was then turned 90 degrees counter clockwise.  The right middle ear was then injected transtympanically with 1 mL of Decadron 10 mg/mL using a 25-gauge spinal needle.   A small amount of hair was clipped in the right postauricular area, hair was taped, and a stockinette cap was applied.  A right lazy-S cochlear implant incision was then marked with the aid of an Advanced Bionics template and a dummy ICS.  The site was marked on the scalp for the center portion of the ICS.  The incision line was then infiltrated with 9 mL of 1%  Xylocaine with 1:100,000 epinephrine.   Silver wire needle electrodes were then inserted into the right orbicularis oris and oculi muscles and suprasternal notch.  The electrodes were connected to a NIM facial nerve monitor preamplifier.  All electrodes were tested and were found to be functioning properly.   The circulating nurse inserted a Foley catheter. The patient's right ear and hemiface were then prepped with Betadine and draped in the standard fashion for a cochlear implant.     A right postauricular incision was then made and carried down through skin and subcutaneous tissue.  A T-shaped incision was then made in the musculoperiosteal layer, and anterior and posterior subperiosteal flaps were elevated.  Self-retaining retractors were placed. The mastoid cortex was then removed with a #5 cutting bur using continuous suction irrigation and a Stryker drill.  The  mastoid was found to be normally pneumatized.  The antrum was opened, and the dissection was carried down toward the digastric tip.  The aditus ad antrum was then followed to the attic and enough bone was removed in the attic to identify the short process of the incus.  The facial recess was then opened with #3 and #2 cutting burs. The middle ear was entered without difficulty.  The facial nerve was identified but was not exposed.  The nerve stimulated at 1 milliamp throughout the procedure.  Facial recess was enlarged. The chorda tympani nerve was sacrificed as it was coursing through the middle of the facial recess.   The round window niche was found to be constricted filled with some fibrous tissue..  A thick lip of the niche was removed with a 1 mm diamond bur to expose the round window membrane, which measured 2x2 mm. as measured with a 1 mm bur. A disk of Gelfoam soaked in Decadron 10 mg/mL was then placed against the round window membrane.   The lazy-S incision was then extended.  An incision was made in the temporalis muscle to create a muscle flap, and the site for the ICS was identified.  Using an Advanced Bionics template, a bony well was drilled for the ICS to a depth of 2 mm.  It was also positioned 20 mm from the posterior-superior corner of the mastoid cavity.  A dummy ICS was used and sure that the ICS would fit well into the bony well.  A trough was drilled from the center portion of the well to the posterior-superior aspect of the mastoid cavity.  The edges were smoothed. Bone wax was used to control bone bleeding.  A pocket was then made posterior to the bony well for the ICS and was checked with an ABC dummy ICS.Marland Kitchen  The site was then copiously irrigated with bacitracin containing saline, and hemostasis was obtained with bipolar cautery.    Unipolar cautery was not utilized at all during the procedure due to the patient's left cochlear implant. Gloves were changed.   An Advanced Bionics HiRes  90 K Ultra 3D cochlear implant with a mid scaler electrode cochlear implant, serial [id number], was  opened under saline and was placed on the Mayo stand.  The implant was then opened and was inspected.  The ICS was then placed in its pocket with the ICS in the bony well.  Decadron 10 mg/mL was then injected into the sheath around the electrode array.    An opening was then made in the round window membrane using a 5910 Beaver blade.  The electrode array was secured with an offset forceps. The  tip of the electrode array was inserted into the scala tympani to the first blue ring. The electrode array was then gently inserted into the cochlea over a 180 second time period by the clock.  A full-length insertion was accomplished. Fascial squares were then packed around the round window niche and around the electrode array to seal the round window opening.  The facial recess was then sealed with a muscle plug held in place with a Gel-Foam soaked in saline. The electrode array was coiled into the mastoid inferiorly and was stabilized with 4 rectangles of Gelfoam soaked in saline.   The musculoperiosteal layer was then closed over the electrode array with interrupted 3-0 chromics.  The temporalis muscle flap was then closed over the ICS using interrupted 3-0 chromics.  This stabilized the ICS.  The subcutaneous layer of the lazy-S cochlear implant incision was then closed using interrupted inverted 3-0 chromics.   Hanley Ben, AuD and Alisa Graff AuD, my audiologists, then entered the operating room and performed intraoperative testing of the cochlear implant.  All impedances were normal, and all electrodes were found to be functioning.   A C-arm x-ray was then performed and documented proper placement of the electrode array within the right cochlea without kinking or telescoping of th electrode array.   Skin was closed with skin staples.  Bacitracin ointment was applied to the incision.  The 8 ear was then  padded with Telfa and cotton.  A standard adult Glasscock mastoid dressing was applied loosely in the standard fashion.   The patient's Foley catheter was removed by the circulating nurse. The patient was awakened, extubated, and transferred to her hospital bed.    She appeared to tolerate both the general endotracheal anesthesia and the procedure well.  She left the operating room in stable condition.  The patient's left cochlear implant external sound processor was applied to the left side of her head in the recovery room facilitate her awakening on the anesthesia.   INTRAOPERATIVE  DATA:   1. Total fluids:   1750 mL. 2. Total estimated blood loss:  Less than 20 mL. 3. Counts:  Sponge, needle, and cotton ball counts were correct at the termination of the procedure. 4. Specimens:  No specimens were sent to microbiology or pathology.   The patient will be admitted to the PACU, then will be admitted to [facility] for 23 hours of overnight observation.  If stable overnight, the patient will be discharged in the morning of November 13, 2018, with her parents. They will be instructed to have her return to my office on November 20, 2018 at 2:10 PM.   DISCHARGE MEDICATIONS:  1. Clindamycin 300 mg p.o. 3 times daily x10 days with food(#30)  2. Norco 5/325 1-2  p.o. q.4 hours p.r.n. pain x3 days (#20 tabs) 3. Mupirocin ointment 2% applied to the cochlear implant incision b.i.d. x1 week.   The patient is to follow a regular diet, keep her head elevated, and avoid water exposure AD for the next 3 weeks.  The patient is to call [phone] for any postoperative problems directly related to the procedure.  She will be given both verbal and written instructions.   SUMMARY:  The patient underwent an Advanced Bionics Hi-Res Ultra 3D with mid scalar cochlear implant without complication.  A full-length insertion was performed.  She was implanted with serial #[id number]. Intraoperative electrode testing revealed all  impedances were normal, and the electrodes were all functioning.  Intraoperative C-arm x-raydocumented proper placement of  the electrode array within the right cochlea.  There were no complications.  The facial nerve was identified, but was not exposed. The nerve stimulated at 1 milliamp throughout the case. The chorda tympani nerve was sacrificed as it coursed directly through the midportion of the facial recess. There were no complications.  Sponge, needle, and cotton ball counts were correct at the termination of the operation.    Signed by: Ermalinda BarriosEric Kasey Ewings, MD, MS, FACS Date:         11/12/2018 Time: 12:39 PM Office: [facility] [place] [place]             [phone] [address], [address]             [place], [place] [address] Phone: [phone] Fax:     [phone] Cell:     [phone]

## 2018-11-12 NOTE — Interval H&P Note (Signed)
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 The various methods of treatment have been discussed with the patient and her father. After consideration of risks, benefits and other options for treatment, the patient has consented to  Procedure(s): COCHLEAR IMPLANT RIGHT EAR (Right)  With an Advanced Bionics Ultra 3D cochlear implant (MRI compatible) as a surgical intervention .  The patient's history has been reviewed, patient examined, no change in status, stable for surgery.  I have reviewed the patient's chart and labs.  Questions were answered to the patient's and parent's satisfaction.  The patient does not report any fever, cough, or upper/lower respiratory tract infection during the last 3 days.  Note: The patient has a left ABC cochlear implant that is currently fully functional that is not MRI compatible.   Ermalinda BarriosEric Francie Keeling

## 2018-11-12 NOTE — Transfer of Care (Signed)
Immediate Anesthesia Transfer of Care Note  Patient: Patricia Morse  Procedure(s) Performed: COCHLEAR IMPLANT RIGHT EAR (Right Ear)  Patient Location: PACU  Anesthesia Type:General  Level of Consciousness: awake, sedated and patient cooperative  Airway & Oxygen Therapy: Patient Spontanous Breathing and Patient connected to face mask oxygen  Post-op Assessment: Report given to RN and Post -op Vital signs reviewed and stable  Post vital signs: Reviewed and stable  Last Vitals:  Vitals Value Taken Time  BP 118/66 11/12/2018 12:11 PM  Temp    Pulse 124 11/12/2018 12:13 PM  Resp 27 11/12/2018 12:13 PM  SpO2 99 % 11/12/2018 12:13 PM  Vitals shown include unvalidated device data.  Last Pain:  Vitals:   11/12/18 0723  TempSrc: Oral  PainSc: 0-No pain      Patients Stated Pain Goal: 0 (11/12/18 0723)  Complications: No apparent anesthesia complications

## 2018-11-12 NOTE — Brief Op Note (Signed)
11/12/2018  12:59 PM  PATIENT:  Patricia Morse  32 y.o. female  PRE-OPERATIVE DIAGNOSIS: Profound bilateral sensorineural hearing loss secondary to auditory neuropathy, beyond hearing aids, and status post left cochlear implant  POST-OPERATIVE DIAGNOSIS:   Profound bilateral sensorineural hearing loss secondary to auditory neuropathy, beyond hearing aids, and status post left cochlear implant  PROCEDURE:  Procedure(s): COCHLEAR IMPLANT RIGHT EAR (Right) - Advanced Bionics Hi-Res Ultra 3D with midscalar electrode cochlea implant, serial #[id number]  SURGEON:  Surgeon(s) and Role:    Ermalinda Barrios* Maclane Holloran, MD - Primary  PHYSICIAN ASSISTANT: None  ASSISTANTS: none   ANESTHESIA:   general  EBL:  20 mL   BLOOD ADMINISTERED:none  DRAINS: none   LOCAL MEDICATIONS USED:  XYLOCAINE 9 mL's  SPECIMEN:  No Specimen  DISPOSITION OF SPECIMEN:  N/A  COUNTS:  YES  TOURNIQUET:  * No tourniquets in log *  DICTATION: .Dragon Dictation  PLAN OF CARE: Admit for overnight observation  PATIENT DISPOSITION:  PACU - hemodynamically stable.   Delay start of Pharmacological VTE agent (>24hrs) due to surgical blood loss or risk of bleeding: not applicable

## 2018-11-12 NOTE — Anesthesia Preprocedure Evaluation (Addendum)
Anesthesia Evaluation  Patient identified by MRN, date of birth, ID band Patient awake    Reviewed: Allergy & Precautions, NPO status , Patient's Chart, lab work & pertinent test results  History of Anesthesia Complications (+) PONV and history of anesthetic complications  Airway Mallampati: II   Neck ROM: Full    Dental no notable dental hx. (+) Dental Advisory Given   Pulmonary former smoker,    Pulmonary exam normal        Cardiovascular negative cardio ROS Normal cardiovascular exam     Neuro/Psych PSYCHIATRIC DISORDERS Depression negative neurological ROS     GI/Hepatic negative GI ROS, Neg liver ROS,   Endo/Other  negative endocrine ROS  Renal/GU negative Renal ROS  negative genitourinary   Musculoskeletal negative musculoskeletal ROS (+)   Abdominal   Peds negative pediatric ROS (+)  Hematology negative hematology ROS (+)   Anesthesia Other Findings   Reproductive/Obstetrics negative OB ROS                            Anesthesia Physical Anesthesia Plan  ASA: II  Anesthesia Plan: General   Post-op Pain Management:    Induction: Intravenous  PONV Risk Score and Plan: 4 or greater and Ondansetron, Dexamethasone, Scopolamine patch - Pre-op and Diphenhydramine  Airway Management Planned: Oral ETT  Additional Equipment:   Intra-op Plan:   Post-operative Plan: Extubation in OR  Informed Consent: I have reviewed the patients History and Physical, chart, labs and discussed the procedure including the risks, benefits and alternatives for the proposed anesthesia with the patient or authorized representative who has indicated his/her understanding and acceptance.   Dental advisory given  Plan Discussed with: CRNA and Anesthesiologist  Anesthesia Plan Comments:         Anesthesia Quick Evaluation

## 2018-11-12 NOTE — Anesthesia Postprocedure Evaluation (Signed)
Anesthesia Post Note  Patient: Patricia Morse  Procedure(s) Performed: COCHLEAR IMPLANT RIGHT EAR (Right Ear)     Patient location during evaluation: PACU Anesthesia Type: General Level of consciousness: sedated Pain management: pain level controlled Vital Signs Assessment: post-procedure vital signs reviewed and stable Respiratory status: spontaneous breathing and respiratory function stable Cardiovascular status: stable Postop Assessment: no apparent nausea or vomiting Anesthetic complications: no    Last Vitals:  Vitals:   11/12/18 1300 11/12/18 1315  BP: 130/77 134/82  Pulse: (!) 123 (!) 121  Resp: 19 17  Temp:    SpO2: 93% 96%    Last Pain:  Vitals:   11/12/18 1300  TempSrc:   PainSc: 0-No pain                 Jerson Furukawa DANIEL

## 2018-11-12 NOTE — Progress Notes (Signed)
S: In Rm #3. RCC. Awake, voided, c/o some pain AD, eyes dry, no nausea or vomiting, a little imbalance when has gotten up to BR, mother and friend in room, voided  O: Awake, alert, VS stable, dressing dry, facial function intact and symmetric, 2 degrees of nystagmus  A: 1. Stable postop course 2. Findings reviewed with patient and mother  P: 1. Tramadol 50 mg PO Q6hrs. Prn pain 2. Continue IV fluids, neurochecks, etc. 3. Plan DC in morning with parents if stable

## 2018-11-13 ENCOUNTER — Encounter (HOSPITAL_BASED_OUTPATIENT_CLINIC_OR_DEPARTMENT_OTHER): Payer: Self-pay | Admitting: Otolaryngology

## 2018-11-13 DIAGNOSIS — Z79899 Other long term (current) drug therapy: Secondary | ICD-10-CM | POA: Diagnosis not present

## 2018-11-13 DIAGNOSIS — Z9621 Cochlear implant status: Secondary | ICD-10-CM | POA: Diagnosis not present

## 2018-11-13 DIAGNOSIS — Z880 Allergy status to penicillin: Secondary | ICD-10-CM | POA: Diagnosis not present

## 2018-11-13 DIAGNOSIS — F329 Major depressive disorder, single episode, unspecified: Secondary | ICD-10-CM | POA: Diagnosis not present

## 2018-11-13 DIAGNOSIS — Z87891 Personal history of nicotine dependence: Secondary | ICD-10-CM | POA: Diagnosis not present

## 2018-11-13 DIAGNOSIS — H903 Sensorineural hearing loss, bilateral: Secondary | ICD-10-CM | POA: Diagnosis not present

## 2018-11-13 DIAGNOSIS — G629 Polyneuropathy, unspecified: Secondary | ICD-10-CM | POA: Diagnosis not present

## 2018-11-13 DIAGNOSIS — Z888 Allergy status to other drugs, medicaments and biological substances status: Secondary | ICD-10-CM | POA: Diagnosis not present

## 2018-11-13 MED ORDER — IBUPROFEN 100 MG/5ML PO SUSP
ORAL | Status: AC
  Filled 2018-11-13: qty 20

## 2018-11-13 MED ORDER — IBUPROFEN 100 MG/5ML PO SUSP
ORAL | Status: AC
  Filled 2018-11-13: qty 5

## 2018-11-13 MED ORDER — MUPIROCIN 2 % EX OINT
TOPICAL_OINTMENT | CUTANEOUS | Status: AC
  Filled 2018-11-13: qty 22

## 2018-11-13 MED ORDER — CLINDAMYCIN PHOSPHATE 300 MG/50ML IV SOLN
INTRAVENOUS | Status: AC
  Filled 2018-11-13: qty 50

## 2018-11-13 NOTE — Progress Notes (Signed)
Subjective: Quiet night, reports of vertigo,slight nausea treated with Zofran, voiding without difficulty.  Ports hearing from her right ear this morning once the resting was removed.  Mother in room.  Objective: Vital signs were stable.  Afebrile.  Awake and alert.                   Facial function was intact and symmetric.                   1 agree of nystagmus                   Incision stable without signs of hematoma.  Assessment: 1.  Stable postoperative course without signs of infection. 2.  Light nausea secondary to medications. 3. Okay for discharge this morning with her mother/  Plan: 1. Discharge this morning with her mother. 2. Return to my office on November 20, 2018 at 2:10 PM 3. Regular diet.  Quiet indoor activity.  Avoid water exposure the incision for the next week.  Void heavy lifting or straining. 4. Charge medications:      1. Clindamycin 300 mg p.o. 3 times daily x10 days with food.      2. Zofran 4 mg p.o. every 4 hours as needed nausea.      3. Tramadol 50 mg p.o. every 6 hours as needed pain is 3 days (#10)      4. Mupirocin ointment applied to the incision twice daily x1 week 5. The patient is to call [phone] for any postoperative problems directly related to the procedure. 6.  Discharge status = stable

## 2018-11-13 NOTE — Discharge Instructions (Signed)
1. Discharge this morning with her mother.  Follow all instructions given to you on the yellow discharge instruction sheets. 2. Return to my office on November 20, 2018 at 2:10 PM 3. Regular diet.  Quiet indoor activity.  Avoid water exposure the incision for the next week.  Void heavy lifting or straining. 4. Charge medications:      1. Clindamycin 300 mg p.o. 3 times daily x10 days with food.      2. Zofran 4 mg p.o. every 4 hours as needed nausea.   Post Anesthesia Home Care Instructions  Activity: Get plenty of rest for the remainder of the day. A responsible individual must stay with you for 24 hours following the procedure.  For the next 24 hours, DO NOT: -Drive a car -Advertising copywriterperate machinery -Drink alcoholic beverages -Take any medication unless instructed by your physician -Make any legal decisions or sign important papers.  Meals: Start with liquid foods such as gelatin or soup. Progress to regular foods as tolerated. Avoid greasy, spicy, heavy foods. If nausea and/or vomiting occur, drink only clear liquids until the nausea and/or vomiting subsides. Call your physician if vomiting continues.  Special Instructions/Symptoms: Your throat may feel dry or sore from the anesthesia or the breathing tube placed in your throat during surgery. If this causes discomfort, gargle with warm salt water. The discomfort should disappear within 24 hours.  If you had a scopolamine patch placed behind your ear for the management of post- operative nausea and/or vomiting:  1. The medication in the patch is effective for 72 hours, after which it should be removed.  Wrap patch in a tissue and discard in the trash. Wash hands thoroughly with soap and water. 2. You may remove the patch earlier than 72 hours if you experience unpleasant side effects which may include dry mouth, dizziness or visual disturbances. 3. Avoid touching the patch. Wash your hands with soap and water after contact with the patch.          3. Tramadol 50 mg p.o. every 6 hours as needed pain is 3 days (#10)      4. Mupirocin ointment applied to the incision twice daily x1 week 5. The patient is to call [phone] for any postoperative problems directly related to the procedure. 6.  Discharge status = stable

## 2018-11-13 NOTE — Discharge Summary (Signed)
Physician Discharge Summary  Patient ID: Patricia Morse MRN: [id number] DOB/AGE: 07-27-1986 32 y.o.  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  She was evaluated by anesthesia.  She was taken to operating room #6 at [facility] and underwent an uncomplicated  Right Advanced Bionics HiRes Ultra 3D with mid-scalar electrode cochlear implant under general endotracheal anesthesia.  Full length insertion of the electrode array was accomplished.  Preoperative testing by my audiologist documented proper functioning of the clear implant internal receiver.  An intraoperative C arm x-ray documented proper position of the electrode array within the right cochlea.  No complications.  She was recovered in the PACU and [facility].  She had an uncomplicated postoperative course with intact facial function and a stable right temporal parietal incision..  Some slight nausea on the first postoperative day treated with Zofran.  She was discharged in stable condition on the morning of November 13, 2018 with her mother.  She was instructed to return to my office on November 20, 2018 at 2:10 PM for follow-up and staple removal.  Discharge medications included: Clindamycin, Zofran, tramadol, and mupirocin.  There were no complications.   Consults: None  Significant Diagnostic Studies: Intraoperative cochlear  implant testing by my audiologists documented that the internal receiver was working well and to our expectations.  Intraoperative C arm x-ray commended proper position of the electrode array within the right cochlea.  Treatments: surgery: Right Advanced Bionics HiRes Ultra 3D with mid-scalar electrode cochlear implant -full-length insertion without complications   Discharge Exam: Blood pressure 128/71, pulse 98, temperature 98.2 F (36.8 C), resp. rate 18, height 5\' 5"  (1.651 m), weight 85.8 kg, last menstrual period 10/28/2018, SpO2 97 %. The patient was awake alert and had stable vital signs.  She had intact right facial function and 1 degree of nystagmus.  Circular incision was healing well without signs of infection or hematoma.  Remainder of her examination was unremarkable.  Patient did report hearing from her right ear.  Disposition:   Diet regular as tolerated  Room Locations patient was in the preoperative area, room #4.  She was in operating room #6.  [facility]  Activity: Quiet interactivity x1 week.  Avoid water exposure right ear.  Regular diet.  She is not to drive or operate machinery she will she has no head shaking nystagmus, vertigo, imbalance, disequilibrium.  She is not to drive or operate machinery while taking tramadol.   1. Discharge this morning with her mother. 2. Return to my office on November 20, 2018 at 2:10 PM 3. Regular diet.  Quiet indoor activity.  Avoid water exposure the incision for the next week.  Void heavy lifting or straining. 4. Charge medications:      1. Clindamycin 300 mg p.o. 3 times daily x10 days with food.      2. Zofran 4 mg p.o. every 4 hours as needed nausea.      3. Tramadol 50 mg p.o. every 6 hours as needed pain is  3 days (#10)      4. Mupirocin ointment applied to the incision twice daily x1 week 5. The patient is to call [phone] for any postoperative problems directly related to the procedure. 6.  Discharge status  = stable   Signed: Ermalinda BarriosEric Caley Volkert, MD, MS , FACS 11/13/2018, 8:22 AM

## 2018-12-02 ENCOUNTER — Ambulatory Visit (INDEPENDENT_AMBULATORY_CARE_PROVIDER_SITE_OTHER): Payer: BLUE CROSS/BLUE SHIELD | Admitting: Licensed Clinical Social Worker

## 2018-12-02 DIAGNOSIS — F3341 Major depressive disorder, recurrent, in partial remission: Secondary | ICD-10-CM

## 2018-12-08 ENCOUNTER — Encounter: Payer: Self-pay | Admitting: Family Medicine

## 2018-12-11 DIAGNOSIS — H903 Sensorineural hearing loss, bilateral: Secondary | ICD-10-CM | POA: Diagnosis not present

## 2018-12-23 ENCOUNTER — Ambulatory Visit (INDEPENDENT_AMBULATORY_CARE_PROVIDER_SITE_OTHER): Payer: BLUE CROSS/BLUE SHIELD | Admitting: Licensed Clinical Social Worker

## 2018-12-23 DIAGNOSIS — H903 Sensorineural hearing loss, bilateral: Secondary | ICD-10-CM | POA: Diagnosis not present

## 2018-12-23 DIAGNOSIS — F3341 Major depressive disorder, recurrent, in partial remission: Secondary | ICD-10-CM

## 2019-01-07 ENCOUNTER — Ambulatory Visit (INDEPENDENT_AMBULATORY_CARE_PROVIDER_SITE_OTHER): Payer: BLUE CROSS/BLUE SHIELD | Admitting: Licensed Clinical Social Worker

## 2019-01-07 DIAGNOSIS — F3341 Major depressive disorder, recurrent, in partial remission: Secondary | ICD-10-CM | POA: Diagnosis not present

## 2019-01-07 DIAGNOSIS — F331 Major depressive disorder, recurrent, moderate: Secondary | ICD-10-CM | POA: Diagnosis not present

## 2019-01-11 NOTE — Progress Notes (Addendum)
[facility] at [facility] [address] [place], [place] [address] [phone]  Date:  01/12/2019   Name:  Patricia Morse   DOB:  [birth year]   MRN:  [id number]  PCP:  Pearline Cables, MD    Chief Complaint: Lab Work (weight gain, lipid panel, a1c)   History of Present Illness:  Patricia Morse is a 33 y.o. very pleasant female patient who presents with the following:  Patient with history of obesity and hearing loss, here today for some lab work Her psychiatrist has requested an A1c and lipid panel, as she is now taking Abilify She started this about  2 months ago and feels like it is helping her  They want her to continue to use this for about 6 months  CMP and CBC done in August She had a second cochlear implant procedure in December- she is still adjusting to this, her hearing is better but understanding speech is still difficult   Pap; per her GYN.  She will be seen this spring Per pt never had an abnl  Her psychiatrist is Dr. Angus Palms-   We will send him her lab reports  [phone] [address]  She is engaged recently, she and her fiance plan to marry in about 15 months They are excited about getting married  She does notice that her periodic allergic conjunctivitis is acting up again, would like a refill of her Pataday  Wt Readings from Last 3 Encounters:  01/12/19 189 lb (85.7 kg)  11/12/18 189 lb 2.5 oz (85.8 kg)  10/02/18 187 lb (84.8 kg)     Patient Active Problem List   Diagnosis Date Noted  . Postoperative observation 11/12/2018  . Visual disturbance 04/25/2016  . Dizziness 04/25/2016  . Obesity, unspecified 06/08/2013  . Depression 10/10/2012  . Hearing loss 10/10/2012    Past Medical History:  Diagnosis Date  . Allergy   . Auditory neuropathy BILATERAL  . Bilateral sensorineural hearing loss    SECONDARY AUDITORY NEUROPATHY  . 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 Depression   . 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 Internal derangement of right knee   . PONV (postoperative nausea and vomiting) POSTOP COCHLEAR  IMPLANT 2006  . Seasonal allergies     Past Surgical History:  Procedure Laterality Date  . BILATERAL MIDDLE EAR EXPLORATION/ SEALING OF OVAL AND ROUND WINDOW FISTULAS  1999 (AGE 47)   PERILYMPH FISTULAS  . CHONDROPLASTY  03/13/2012   Procedure: CHONDROPLASTY;  Surgeon: Shelda Pal, MD;  Location: [facility];  Service: Orthopedics;  Laterality: Right;  . COCHLEAR IMPLANT  11-15-2005  (AGE 7)   LEFT EAR  (CLARION HIGH-RESOLUTION 90K MULTICHANNEL,  1-J ELECTRODE)  . COCHLEAR IMPLANT Right 11/12/2018   Procedure: COCHLEAR IMPLANT RIGHT EAR;  Surgeon: Ermalinda Barrios, MD;  Location: [facility];  Service: ENT;  Laterality: Right;  . KNEE ARTHROSCOPY  03/13/2012   Procedure: ARTHROSCOPY KNEE;  Surgeon: Shelda Pal, MD;  Location: [facility];  Service: Orthopedics;  Laterality: Right;  RIGHT KNEE DIAGNOSTIC AND OPERATIVE SCOPE  . WISDOM TOOTH EXTRACTION  AGE 30   DENTAL OFFICE    Social History   Tobacco Use  . Smoking status: Former Smoker    Types: Cigarettes    Last attempt to quit: 03/10/2008  Years since quitting: 10.8  . Smokeless tobacco: Never Used  . Tobacco comment: SOCIAL SMOKER IN COLLEGE FOR 70YRS -- NO SMOKING SINCE  Substance Use Topics  . Alcohol use: Yes    Alcohol/week: 3.0 - 5.0 standard drinks    Types: 3 - 5 Glasses of wine per week    Comment: OCCASIONAL, "social"   . Drug use: No    Family History  Problem Relation Age of Onset  . Depression Mother   . Hypertension Mother   . Mental illness Mother   . Anxiety disorder Maternal Grandmother     Allergies  Allergen Reactions  . Amoxicillin Hives  . Tdap [Tetanus-Diphth-Acell Pertussis]   . Hydrocodeine [Dihydrocodeine] Rash  . Penicillins Rash     Medication list has been reviewed and updated.  Current Outpatient Medications on File Prior to Visit  Medication Sig Dispense Refill  . APRI 0.15-30 MG-MCG tablet   5  . ARIPiprazole (ABILIFY) 2 MG tablet Take 2 mg by mouth daily.    . cetirizine (ZYRTEC) 10 MG tablet Take 10 mg by mouth daily.    Marland Kitchen FLUoxetine (PROZAC) 40 MG capsule Take 1 capsule (40 mg total) by mouth daily. 90 capsule 1  . fluticasone (FLONASE) 50 MCG/ACT nasal spray Place 2 sprays into both nostrils daily. 16 g 1  . hydrOXYzine (VISTARIL) 25 MG capsule Take 1 capsule (25 mg total) by mouth 3 (three) times daily as needed. 30 capsule 0  . Multiple Vitamins-Minerals (MULTIVITAMIN WITH MINERALS) tablet Take 1 tablet by mouth daily.    . Probiotic Product (PROBIOTIC DAILY PO) Take by mouth.    . triamcinolone (KENALOG) 0.1 % paste Apply to canker sore twice a day as needed 5 g 2  . valACYclovir (VALTREX) 1000 MG tablet TAKE 2 TABLETS TWICE A DAY FOR 2 DOSES FOR COLD SORES 30 tablet 7   No current facility-administered medications on file prior to visit.     Review of Systems:  As per HPI- otherwise negative. She is exercising and trying to lose weight.  I advised her that Abilify can cause weight gain, so she has done a good job just keeping her weight stable  Physical Examination: Vitals:   01/12/19 0951  BP: 116/78  Pulse: 84  Resp: 16  Temp: 98.3 F (36.8 C)  SpO2: 98%   Vitals:   01/12/19 0951  Weight: 189 lb (85.7 kg)  Height: 5\' 5"  (1.651 m)   Body mass index is 31.45 kg/m. Ideal Body Weight: Weight in (lb) to have BMI = 25: 149.9  GEN: WDWN, NAD, Non-toxic, A & O x 3, overweight, looks well  HEENT: Atraumatic, Normocephalic. Neck supple. No masses, No LAD. She is still quite hard of hearing, and does rely on reading lips a good bit Ears and Nose: No external deformity. CV: RRR, No M/G/R. No JVD. No thrill. No extra heart sounds. PULM: CTA B, no wheezes, crackles, rhonchi. No retractions.  No resp. distress. No accessory muscle use. EXTR: No c/c/e NEURO Normal gait.  PSYCH: Normally interactive. Conversant. Not depressed or anxious appearing.  Calm demeanor.    Assessment and Plan: Medication monitoring encounter - Plan: Lipid panel, Hemoglobin A1c  Allergic conjunctivitis of both eyes - Plan: Olopatadine HCl (PATADAY) 0.2 % SOLN  Here today for labs as requested by her psychiatrist.  We will get a lipid panel and A1c, I will send him her results She also notes a recurrence of her allergic conjunctivitis.  She is used Medical laboratory scientific officer  in the past with good results, refilled this for her  Signed Abbe AmsterdamJessica Nitisha Civello, MD  Received her labs, as follows Results for orders placed or performed in visit on 01/12/19  Lipid panel  Result Value Ref Range   Cholesterol 222 (H) 0 - 200 mg/dL   Triglycerides 409.8207.0 (H) 0.0 - 149.0 mg/dL   HDL 11.9165.50 >47.82>39.00 mg/dL   VLDL 95.641.4 (H) 0.0 - 21.340.0 mg/dL   Total CHOL/HDL Ratio 3    NonHDL 156.39   LDL cholesterol, direct  Result Value Ref Range   Direct LDL 141.0 mg/dL   Await [id number]

## 2019-01-12 ENCOUNTER — Ambulatory Visit: Payer: BLUE CROSS/BLUE SHIELD | Admitting: Family Medicine

## 2019-01-12 ENCOUNTER — Encounter: Payer: Self-pay | Admitting: Family Medicine

## 2019-01-12 ENCOUNTER — Telehealth: Payer: Self-pay | Admitting: *Deleted

## 2019-01-12 VITALS — BP 116/78 | HR 84 | Temp 98.3°F | Resp 16 | Ht 65.0 in | Wt 189.0 lb

## 2019-01-12 DIAGNOSIS — H1013 Acute atopic conjunctivitis, bilateral: Secondary | ICD-10-CM | POA: Diagnosis not present

## 2019-01-12 DIAGNOSIS — Z5181 Encounter for therapeutic drug level monitoring: Secondary | ICD-10-CM

## 2019-01-12 LAB — LIPID PANEL
CHOL/HDL RATIO: 3
Cholesterol: 222 mg/dL — ABNORMAL HIGH (ref 0–200)
HDL: 65.5 mg/dL (ref >39.00)
NonHDL: 156.39
Triglycerides: 207 mg/dL — ABNORMAL HIGH (ref 0.0–149.0)
VLDL: 41.4 mg/dL — ABNORMAL HIGH (ref 0.0–40.0)

## 2019-01-12 LAB — LDL CHOLESTEROL, DIRECT: Direct LDL: 141 mg/dL

## 2019-01-12 MED ORDER — OLOPATADINE HCL 0.2 % OP SOLN: OPHTHALMIC | 6 refills | Status: DC

## 2019-01-12 NOTE — Patient Instructions (Addendum)
It was great to see you today-  I will be in touch with your labs asap  I will send a copy to Dr. Rene Kocher for you Best wishes on your engagement!

## 2019-01-12 NOTE — Telephone Encounter (Signed)
Received call from [facility] lab that they did not receive a lavender tube for pt's a1c from today's visit. Master label in the lab only has lipid panel, does not show a1c was ordered. We can see order for a1c in chart review but did not print from [facility] so it was not drawn. Sent mychart message to see if she can return to the lab for a1c.

## 2019-01-14 DIAGNOSIS — H903 Sensorineural hearing loss, bilateral: Secondary | ICD-10-CM | POA: Diagnosis not present

## 2019-01-15 NOTE — Telephone Encounter (Signed)
Mychart message from 2/17 has not been read by pt yet. Left detailed message on her voicemail to please check mychart message and respond back via mychart. Awaiting response.

## 2019-01-15 NOTE — Addendum Note (Signed)
Addended by: Kathi Simpers on: 01/15/2019 09:34 AM   Modules accepted: Orders

## 2019-01-17 ENCOUNTER — Encounter: Payer: Self-pay | Admitting: Family Medicine

## 2019-01-19 ENCOUNTER — Other Ambulatory Visit (INDEPENDENT_AMBULATORY_CARE_PROVIDER_SITE_OTHER): Payer: BLUE CROSS/BLUE SHIELD

## 2019-01-19 DIAGNOSIS — Z5181 Encounter for therapeutic drug level monitoring: Secondary | ICD-10-CM

## 2019-01-20 LAB — HEMOGLOBIN A1C: Hgb A1c MFr Bld: 5.1 % (ref 4.6–6.5)

## 2019-01-22 ENCOUNTER — Ambulatory Visit (INDEPENDENT_AMBULATORY_CARE_PROVIDER_SITE_OTHER): Payer: BLUE CROSS/BLUE SHIELD | Admitting: Licensed Clinical Social Worker

## 2019-01-22 DIAGNOSIS — F3341 Major depressive disorder, recurrent, in partial remission: Secondary | ICD-10-CM | POA: Diagnosis not present

## 2019-01-26 ENCOUNTER — Encounter: Payer: Self-pay | Admitting: Family Medicine

## 2019-01-26 NOTE — Progress Notes (Signed)
[facility] at [facility] [address], [address] [place], [place] [address] [phone]  Date:  01/28/2019   Name:  Patricia Morse   DOB:  [birth year]   MRN:  [id number]  PCP:  Pearline Cables, MD    Chief Complaint: Sinusitis (congestion, productive cough, teeth pain, headache, no fever)   History of Present Illness:  Patricia Morse is a 33 y.o. very pleasant female patient who presents with the following:  Here today with concern of a sinus infection History of obesity, hearing loss. She had a second cochlear implant procedure in December 2019 I saw her on February 17, her psychiatrist had started on Abilify and requested an A1c and lipid panel  She notes sx of illness maybe a week ago- like a head cold Over the weekend she felt worse, she is blowing green mucus out of her nose  No recent travel or in an airport  No fever She has noted a mild cough, but thinks this is from PND Her main issue is sinus congestion -she does not really feel like her symptoms are in her chest  Patient Active Problem List   Diagnosis Date Noted  . Postoperative observation 11/12/2018  . Visual disturbance 04/25/2016  . Dizziness 04/25/2016  . Obesity, unspecified 06/08/2013  . Depression 10/10/2012  . Hearing loss 10/10/2012    Past Medical History:  Diagnosis Date  . Allergy   . Auditory neuropathy BILATERAL  . Bilateral sensorineural hearing loss    SECONDARY AUDITORY NEUROPATHY  . 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 Depression   . 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 Internal derangement of right knee   . PONV (postoperative nausea and vomiting) POSTOP COCHLEAR  IMPLANT 2006  . Seasonal allergies     Past Surgical History:  Procedure Laterality Date  . BILATERAL MIDDLE EAR EXPLORATION/ SEALING OF OVAL AND ROUND WINDOW FISTULAS  1999 (AGE 36)   PERILYMPH FISTULAS  .  CHONDROPLASTY  03/13/2012   Procedure: CHONDROPLASTY;  Surgeon: Shelda Pal, MD;  Location: [facility];  Service: Orthopedics;  Laterality: Right;  . COCHLEAR IMPLANT  11-15-2005  (AGE 4)   LEFT EAR  (CLARION HIGH-RESOLUTION 90K MULTICHANNEL,  1-J ELECTRODE)  . COCHLEAR IMPLANT Right 11/12/2018   Procedure: COCHLEAR IMPLANT RIGHT EAR;  Surgeon: Ermalinda Barrios, MD;  Location: [facility];  Service: ENT;  Laterality: Right;  . KNEE ARTHROSCOPY  03/13/2012   Procedure: ARTHROSCOPY KNEE;  Surgeon: Shelda Pal, MD;  Location: [facility];  Service: Orthopedics;  Laterality: Right;  RIGHT KNEE DIAGNOSTIC AND OPERATIVE SCOPE  . WISDOM TOOTH EXTRACTION  AGE 50   DENTAL OFFICE    Social History   Tobacco Use  . Smoking status: Former Smoker    Types: Cigarettes    Last attempt to quit: 03/10/2008    Years since quitting: 10.8  . Smokeless tobacco: Never Used  . Tobacco comment: SOCIAL SMOKER IN COLLEGE FOR 78YRS -- NO SMOKING SINCE  Substance Use Topics  . Alcohol use: Yes    Alcohol/week: 3.0 - 5.0 standard drinks    Types: 3 - 5 Glasses of wine per week    Comment: OCCASIONAL, "social"   . Drug use: No    Family History  Problem Relation Age of Onset  . Depression Mother   . Hypertension Mother   .  Mental illness Mother   . Anxiety disorder Maternal Grandmother     Allergies  Allergen Reactions  . Amoxicillin Hives  . Tdap [Tetanus-Diphth-Acell Pertussis]   . Hydrocodeine [Dihydrocodeine] Rash  . Penicillins Rash    Medication list has been reviewed and updated.  Current Outpatient Medications on File Prior to Visit  Medication Sig Dispense Refill  . APRI 0.15-30 MG-MCG tablet   5  . ARIPiprazole (ABILIFY) 2 MG tablet Take 2 mg by mouth daily.    . cetirizine (ZYRTEC) 10 MG tablet Take 10 mg by mouth daily.    Marland Kitchen FLUoxetine (PROZAC) 40 MG capsule Take 1 capsule (40 mg total) by mouth daily. 90 capsule 1  . fluticasone (FLONASE)  50 MCG/ACT nasal spray Place 2 sprays into both nostrils daily. 16 g 1  . hydrOXYzine (VISTARIL) 25 MG capsule Take 1 capsule (25 mg total) by mouth 3 (three) times daily as needed. 30 capsule 0  . Multiple Vitamins-Minerals (MULTIVITAMIN WITH MINERALS) tablet Take 1 tablet by mouth daily.    . Olopatadine HCl (PATADAY) 0.2 % SOLN Apply 1 drop to eyes daily as needed for allergies 2.5 mL 6  . Probiotic Product (PROBIOTIC DAILY PO) Take by mouth.    . triamcinolone (KENALOG) 0.1 % paste Apply to canker sore twice a day as needed 5 g 2  . valACYclovir (VALTREX) 1000 MG tablet TAKE 2 TABLETS TWICE A DAY FOR 2 DOSES FOR COLD SORES 30 tablet 7   No current facility-administered medications on file prior to visit.     Review of Systems:  As per HPI- otherwise negative. No vomiting or diarrhea She felt pain in her teeth when she bend her head down to put a hair towel on this am   LMP a week ago, she does not have any suspicion of pregnancy  Physical Examination: Vitals:   01/28/19 1030  BP: 124/80  Pulse: (!) 108  Resp: 18  Temp: 98.5 F (36.9 C)  SpO2: 97%   Vitals:   01/28/19 1030  Weight: 190 lb (86.2 kg)  Height: 5\' 5"  (1.651 m)   Body mass index is 31.62 kg/m. Ideal Body Weight: Weight in (lb) to have BMI = 25: 149.9  GEN: WDWN, NAD, Non-toxic, A & O x 3, overweight, looks well HEENT: Atraumatic, Normocephalic. Neck supple. No masses, No LAD.  Bilateral TM wnl, oropharynx normal.  PEERL,EOMI.   Frontal sinuses are tender to percussion, and there is purulent nasal discharge Ears and Nose: No external deformity. CV: RRR, No M/G/R. No JVD. No thrill. No extra heart sounds. PULM: CTA B, no wheezes, crackles, rhonchi. No retractions. No resp. distress. No accessory muscle use. EXTR: No c/c/e NEURO Normal gait.  PSYCH: Normally interactive. Conversant. Not depressed or anxious appearing.  Calm demeanor.    Assessment and Plan: Acute non-recurrent frontal sinusitis - Plan:  doxycycline (VIBRAMYCIN) 100 MG capsule  Treat for recurrent sinus infection with doxycycline, 20 mg twice a day for 10 days. She will alert me if not feeling better in 2 days, sooner if worse she states no chance of current pregnancy   Signed Abbe Amsterdam, MD

## 2019-01-28 ENCOUNTER — Encounter: Payer: Self-pay | Admitting: Family Medicine

## 2019-01-28 ENCOUNTER — Ambulatory Visit: Payer: BLUE CROSS/BLUE SHIELD | Admitting: Family Medicine

## 2019-01-28 VITALS — BP 124/80 | HR 90 | Temp 98.5°F | Resp 18 | Ht 65.0 in | Wt 190.0 lb

## 2019-01-28 DIAGNOSIS — J0110 Acute frontal sinusitis, unspecified: Secondary | ICD-10-CM

## 2019-01-28 MED ORDER — DOXYCYCLINE HYCLATE 100 MG PO CAPS: 100.0000 mg | ORAL_CAPSULE | Freq: Two times a day (BID) | ORAL | 0 refills | Status: DC

## 2019-01-28 NOTE — Patient Instructions (Signed)
A pleasure to see you today, as always We will treat you for sinus infection with doxycycline.  Take this twice a day for 10 days  Please let me know if you are not feeling better in the next few days, sooner if you are getting worse

## 2019-02-04 ENCOUNTER — Ambulatory Visit (INDEPENDENT_AMBULATORY_CARE_PROVIDER_SITE_OTHER): Payer: BLUE CROSS/BLUE SHIELD | Admitting: Licensed Clinical Social Worker

## 2019-02-04 DIAGNOSIS — F3341 Major depressive disorder, recurrent, in partial remission: Secondary | ICD-10-CM | POA: Diagnosis not present

## 2019-02-06 DIAGNOSIS — H903 Sensorineural hearing loss, bilateral: Secondary | ICD-10-CM | POA: Diagnosis not present

## 2019-02-25 ENCOUNTER — Ambulatory Visit: Payer: BLUE CROSS/BLUE SHIELD | Admitting: Licensed Clinical Social Worker

## 2019-03-02 ENCOUNTER — Encounter: Payer: Self-pay | Admitting: Family Medicine

## 2019-03-05 ENCOUNTER — Ambulatory Visit (INDEPENDENT_AMBULATORY_CARE_PROVIDER_SITE_OTHER): Payer: BLUE CROSS/BLUE SHIELD | Admitting: Licensed Clinical Social Worker

## 2019-03-05 DIAGNOSIS — F3341 Major depressive disorder, recurrent, in partial remission: Secondary | ICD-10-CM | POA: Diagnosis not present

## 2019-03-20 ENCOUNTER — Ambulatory Visit: Payer: BLUE CROSS/BLUE SHIELD | Admitting: Licensed Clinical Social Worker

## 2019-04-02 ENCOUNTER — Ambulatory Visit (INDEPENDENT_AMBULATORY_CARE_PROVIDER_SITE_OTHER): Payer: BLUE CROSS/BLUE SHIELD | Admitting: Licensed Clinical Social Worker

## 2019-04-02 DIAGNOSIS — F3341 Major depressive disorder, recurrent, in partial remission: Secondary | ICD-10-CM | POA: Diagnosis not present

## 2019-04-03 DIAGNOSIS — F331 Major depressive disorder, recurrent, moderate: Secondary | ICD-10-CM | POA: Diagnosis not present

## 2019-04-30 ENCOUNTER — Ambulatory Visit: Payer: BLUE CROSS/BLUE SHIELD | Admitting: Licensed Clinical Social Worker

## 2019-05-06 ENCOUNTER — Ambulatory Visit (INDEPENDENT_AMBULATORY_CARE_PROVIDER_SITE_OTHER): Payer: BC Managed Care – PPO | Admitting: Licensed Clinical Social Worker

## 2019-05-06 DIAGNOSIS — F3341 Major depressive disorder, recurrent, in partial remission: Secondary | ICD-10-CM

## 2019-05-19 ENCOUNTER — Encounter: Payer: Self-pay | Admitting: Family Medicine

## 2019-05-20 NOTE — Telephone Encounter (Signed)
Should patient have appointment with you first or just podiatry referral?

## 2019-05-21 ENCOUNTER — Ambulatory Visit (INDEPENDENT_AMBULATORY_CARE_PROVIDER_SITE_OTHER): Payer: BC Managed Care – PPO | Admitting: Licensed Clinical Social Worker

## 2019-05-21 DIAGNOSIS — F3341 Major depressive disorder, recurrent, in partial remission: Secondary | ICD-10-CM | POA: Diagnosis not present

## 2019-05-21 DIAGNOSIS — F331 Major depressive disorder, recurrent, moderate: Secondary | ICD-10-CM | POA: Diagnosis not present

## 2019-06-09 ENCOUNTER — Ambulatory Visit (INDEPENDENT_AMBULATORY_CARE_PROVIDER_SITE_OTHER): Payer: BC Managed Care – PPO | Admitting: Licensed Clinical Social Worker

## 2019-06-09 DIAGNOSIS — F3341 Major depressive disorder, recurrent, in partial remission: Secondary | ICD-10-CM

## 2019-06-22 DIAGNOSIS — Z01419 Encounter for gynecological examination (general) (routine) without abnormal findings: Secondary | ICD-10-CM | POA: Diagnosis not present

## 2019-06-22 DIAGNOSIS — Z3041 Encounter for surveillance of contraceptive pills: Secondary | ICD-10-CM | POA: Diagnosis not present

## 2019-06-22 DIAGNOSIS — Z124 Encounter for screening for malignant neoplasm of cervix: Secondary | ICD-10-CM | POA: Diagnosis not present

## 2019-06-22 DIAGNOSIS — Z6833 Body mass index (BMI) 33.0-33.9, adult: Secondary | ICD-10-CM | POA: Diagnosis not present

## 2019-06-23 ENCOUNTER — Ambulatory Visit (INDEPENDENT_AMBULATORY_CARE_PROVIDER_SITE_OTHER): Payer: BC Managed Care – PPO | Admitting: Licensed Clinical Social Worker

## 2019-06-23 DIAGNOSIS — F331 Major depressive disorder, recurrent, moderate: Secondary | ICD-10-CM

## 2019-07-06 ENCOUNTER — Encounter: Payer: Self-pay | Admitting: Family Medicine

## 2019-07-07 ENCOUNTER — Encounter: Payer: Self-pay | Admitting: Family Medicine

## 2019-07-08 ENCOUNTER — Ambulatory Visit (INDEPENDENT_AMBULATORY_CARE_PROVIDER_SITE_OTHER): Payer: BC Managed Care – PPO | Admitting: *Deleted

## 2019-07-08 ENCOUNTER — Other Ambulatory Visit: Payer: Self-pay

## 2019-07-08 DIAGNOSIS — Z111 Encounter for screening for respiratory tuberculosis: Secondary | ICD-10-CM

## 2019-07-08 NOTE — Progress Notes (Addendum)
Patient here for TB skin test per Dr Lorelei Pont  Tb placed on left arm at 1105am.  Advised patient to return on Friday between 1105am and 4pm to read.  I have read the above note and agree- Denny Peon MD

## 2019-07-16 ENCOUNTER — Ambulatory Visit (INDEPENDENT_AMBULATORY_CARE_PROVIDER_SITE_OTHER): Payer: BC Managed Care – PPO | Admitting: Licensed Clinical Social Worker

## 2019-07-16 DIAGNOSIS — F3341 Major depressive disorder, recurrent, in partial remission: Secondary | ICD-10-CM

## 2019-08-11 ENCOUNTER — Encounter: Payer: Self-pay | Admitting: Family Medicine

## 2019-08-11 DIAGNOSIS — M722 Plantar fascial fibromatosis: Secondary | ICD-10-CM

## 2019-08-26 DIAGNOSIS — F331 Major depressive disorder, recurrent, moderate: Secondary | ICD-10-CM | POA: Diagnosis not present

## 2019-09-20 NOTE — Progress Notes (Signed)
[facility] [address] [place], [place] [address] Phone: [phone] Subjective:   Bruce Donath, am serving as a scribe for Dr. Antoine Primas.  I'm seeing this patient by the request  of:  Copland, Gwenlyn Found, MD   CC: Foot pain   INO:[id number]  Patricia Morse is a 33 y.o. female coming in with complaint of left foot pain. Patient has been having pain 6 months. 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 Using a 3/4 length insole in her shoes. Patient also has been using KT taping. Pain over medial calcaneous. Pain in mornings and after sitting for a long period of time. Pain is stabbing. Use Aleve prn.  Patient is hard of hearing    Past Medical History:  Diagnosis Date  . Allergy   . Auditory neuropathy BILATERAL  . Bilateral sensorineural hearing loss    SECONDARY AUDITORY NEUROPATHY  . 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 Depression   . 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 Internal derangement of right knee   . PONV (postoperative nausea and vomiting) POSTOP COCHLEAR  IMPLANT 2006  . Seasonal allergies    Past Surgical History:  Procedure Laterality Date  . BILATERAL MIDDLE EAR EXPLORATION/ SEALING OF OVAL AND ROUND WINDOW FISTULAS  1999 (AGE 45)   PERILYMPH FISTULAS  . CHONDROPLASTY  03/13/2012   Procedure: CHONDROPLASTY;  Surgeon: Shelda Pal, MD;  Location: [facility];  Service: Orthopedics;  Laterality: Right;  . COCHLEAR IMPLANT  11-15-2005  (AGE 25)   LEFT EAR  (CLARION HIGH-RESOLUTION 90K MULTICHANNEL,  1-J ELECTRODE)  . COCHLEAR IMPLANT Right 11/12/2018   Procedure: COCHLEAR IMPLANT RIGHT EAR;  Surgeon: Ermalinda Barrios, MD;  Location: [facility];  Service: ENT;  Laterality: Right;  . KNEE ARTHROSCOPY  03/13/2012   Procedure: ARTHROSCOPY KNEE;  Surgeon: Shelda Pal, MD;  Location: [facility];  Service: Orthopedics;  Laterality: Right;   RIGHT KNEE DIAGNOSTIC AND OPERATIVE SCOPE  . WISDOM TOOTH EXTRACTION  AGE 20   DENTAL OFFICE   Social History   Socioeconomic History  . Marital status: Single    Spouse name: Not on file  . Number of children: 0  . Years of education: Not on file  . Highest education level: Not on file  Occupational History  . Occupation: Doctor, hospital for [employer]    Employer: [employer]  Social Needs  . Financial resource strain: Not on file  . Food insecurity    Worry: Not on file    Inability: Not on file  . Transportation needs    Medical: Not on file    Non-medical: Not on file  Tobacco Use  . Smoking status: Former Smoker    Types: Cigarettes    Quit date: 03/10/2008    Years since quitting: 11.5  . Smokeless tobacco: Never Used  . Tobacco comment: SOCIAL SMOKER IN COLLEGE FOR 63YRS -- NO SMOKING SINCE  Substance and Sexual Activity  . Alcohol use: Yes    Alcohol/week: 3.0 - 5.0 standard drinks    Types: 3 - 5 Glasses of wine per week    Comment: OCCASIONAL, "social"   . Drug use: No  . Sexual activity: Yes    Partners: Male    Birth control/protection: Pill  Lifestyle  . Physical activity    Days per week: Not  on file    Minutes per session: Not on file  . Stress: Not on file  Relationships  . Social Herbalist on phone: Not on file    Gets together: Not on file    Attends religious service: Not on file    Active member of club or organization: Not on file    Attends meetings of clubs or organizations: Not on file    Relationship status: Not on file  Other Topics Concern  . Not on file  Social History Narrative   At 52 months of age, pt had encephalitis vs. Guillain-Barre Syndrome (MD's unsure, therefore, doesn't take flu shot)   Allergies  Allergen Reactions  . Amoxicillin Hives  . Tdap [Tetanus-Diphth-Acell Pertussis]   . Hydrocodeine [Dihydrocodeine] Rash  . Penicillins Rash   Family History  Problem Relation Age of Onset  .  Depression Mother   . Hypertension Mother   . Mental illness Mother   . Anxiety disorder Maternal Grandmother     Current Outpatient Medications (Endocrine & Metabolic):  Marland Kitchen  APRI 0.15-30 MG-MCG tablet,    Current Outpatient Medications (Respiratory):  .  cetirizine (ZYRTEC) 10 MG tablet, Take 10 mg by mouth daily. .  fluticasone (FLONASE) 50 MCG/ACT nasal spray, Place 2 sprays into both nostrils daily.    Current Outpatient Medications (Other):  Marland Kitchen  ARIPiprazole (ABILIFY) 2 MG tablet, Take 2 mg by mouth daily. Marland Kitchen  doxycycline (VIBRAMYCIN) 100 MG capsule, Take 1 capsule (100 mg total) by mouth 2 (two) times daily. Marland Kitchen  FLUoxetine (PROZAC) 40 MG capsule, Take 1 capsule (40 mg total) by mouth daily. .  hydrOXYzine (VISTARIL) 25 MG capsule, Take 1 capsule (25 mg total) by mouth 3 (three) times daily as needed. .  Multiple Vitamins-Minerals (MULTIVITAMIN WITH MINERALS) tablet, Take 1 tablet by mouth daily. .  Olopatadine HCl (PATADAY) 0.2 % SOLN, Apply 1 drop to eyes daily as needed for allergies .  Probiotic Product (PROBIOTIC DAILY PO), Take by mouth. .  triamcinolone (KENALOG) 0.1 % paste, Apply to canker sore twice a day as needed .  valACYclovir (VALTREX) 1000 MG tablet, TAKE 2 TABLETS TWICE A DAY FOR 2 DOSES FOR COLD SORES    Past medical history, social, surgical and family history all reviewed in electronic medical record.  No pertanent information unless stated regarding to the chief complaint.   Review of Systems:  No headache, visual changes, nausea, vomiting, diarrhea, constipation, dizziness, abdominal pain, skin rash, fevers, chills, night sweats, weight loss, swollen lymph nodes, body aches, joint swelling, muscle aches, chest pain, shortness of breath, mood changes.   Objective  Blood pressure 112/80, pulse 85, height 5\' 5"  (1.651 m), weight 190 lb (86.2 kg), SpO2 96 %.   General: No apparent distress alert and oriented x3 mood and affect normal, dressed appropriately.   HEENT: Pupils equal, extraocular movements intact patient is wearing hearing aids Respiratory: Patient's speak in full sentences and does not appear short of breath  Cardiovascular: No lower extremity edema, non tender, no erythema  Skin: Warm dry intact with no signs of infection or rash on extremities or on axial skeleton.  Abdomen: Soft nontender  Neuro: Cranial nerves II through XII are intact, neurovascularly intact in all extremities with 2+ DTRs and 2+ pulses.  Lymph: No lymphadenopathy of posterior or anterior cervical chain or axillae bilaterally.  Gait normal with good balance and coordination.  MSK:  Non tender with full range of motion and good stability and symmetric  strength and tone of shoulders, elbows, wrist, hip, knee bila right ankle exam shows the patient does have a supinated hindfoot.  Moderate to severe tenderness to palpation of the medial calcaneal area.  No masses appreciated.  Mild breakdown of the longitudinal arch.  Mild breakdown of the transverse arch as well.  Contralateral foot fairly unremarkable in a nice neutral position   Limited musculoskeletal ultrasound was performed and interpreted by Judi SaaZachary M Smith   Limited musculoskeletal ultrasound shows the patient does have some mild enlargement plantar fashion 0.4 cm no true tearing or increasing in Doppler flow, questionable small fibroma noted Impression: Mild plantar fasciitis with questionable fibroma  [id number]; 15 additional minutes spent for Therapeutic exercises as stated in above notes.  This included exercises focusing on stretching, strengthening, with significant focus on eccentric aspects.   Long term goals include an improvement in range of motion, strength, endurance as well as avoiding reinjury. Patient's frequency would include in 1-2 times a day, 3-5 times a week for a duration of 6-12 weeks. Ankle strengthening that included:  Basic range of motion exercises to allow proper full motion at ankle  Stretching of the lower leg and hamstrings  Theraband exercises for the lower leg - inversion, eversion, dorsiflexion and plantarflexion each to be completed with a theraband Balance exercises to increase proprioception Weight bearing exercises to increase strength and balance  Proper technique shown and discussed handout in great detail with ATC.  All questions were discussed and answered.       Impression and Recommendations:     This case required medical decision making of moderate complexity. The above documentation has been reviewed and is accurate and complete Judi SaaZachary M Smith, DO       Note: This dictation was prepared with Dragon dictation along with smaller phrase technology. Any transcriptional errors that result from this process are unintentional.

## 2019-09-21 ENCOUNTER — Other Ambulatory Visit: Payer: Self-pay

## 2019-09-21 ENCOUNTER — Ambulatory Visit: Payer: BC Managed Care – PPO | Admitting: Family Medicine

## 2019-09-21 ENCOUNTER — Ambulatory Visit: Payer: Self-pay

## 2019-09-21 VITALS — BP 112/80 | HR 85 | Ht 65.0 in | Wt 190.0 lb

## 2019-09-21 DIAGNOSIS — M722 Plantar fascial fibromatosis: Secondary | ICD-10-CM | POA: Diagnosis not present

## 2019-09-21 DIAGNOSIS — M79672 Pain in left foot: Secondary | ICD-10-CM

## 2019-09-21 NOTE — Patient Instructions (Addendum)
Good to see you.  Ice 20 minutes 2 times daily. Usually after activity and before bed. Exercises 3 times a week.  [employer] Total Support Orthotics Avoid being barefoot Hard soled shoes: Jalene Mullet or Merrell, New Balance greater than 700 Vitamin D 2000 IU daily  See me again in 5-6 weeks

## 2019-09-21 NOTE — Assessment & Plan Note (Signed)
We reviewed that stretching is critically important to the treatment of PF.  Reviewed footwear. Rigid soles have been shown to help with PF. Night splints can sometimes help. Reviewed rehab of stretching and calf raises.   Could benefit from a corticosteroid injection, orthotics, or other measures if conservative treatment fails. Discussed with patient about which activities to do which was to avoid.  Patient will increase activity as tolerated.  Follow-up again in 4 to 8 weeks

## 2019-09-22 ENCOUNTER — Encounter: Payer: Self-pay | Admitting: Family Medicine

## 2019-09-24 ENCOUNTER — Ambulatory Visit (INDEPENDENT_AMBULATORY_CARE_PROVIDER_SITE_OTHER): Payer: BC Managed Care – PPO | Admitting: Licensed Clinical Social Worker

## 2019-09-24 DIAGNOSIS — F3341 Major depressive disorder, recurrent, in partial remission: Secondary | ICD-10-CM | POA: Diagnosis not present

## 2019-09-27 ENCOUNTER — Encounter: Payer: Self-pay | Admitting: Family Medicine

## 2019-09-27 DIAGNOSIS — Z20822 Contact with and (suspected) exposure to covid-19: Secondary | ICD-10-CM

## 2019-09-27 DIAGNOSIS — Z20828 Contact with and (suspected) exposure to other viral communicable diseases: Secondary | ICD-10-CM

## 2019-09-28 ENCOUNTER — Other Ambulatory Visit: Payer: Self-pay

## 2019-09-28 DIAGNOSIS — Z20822 Contact with and (suspected) exposure to covid-19: Secondary | ICD-10-CM

## 2019-09-29 LAB — NOVEL CORONAVIRUS, NAA: SARS-CoV-2, NAA: NOT DETECTED

## 2019-10-06 ENCOUNTER — Encounter: Payer: Self-pay | Admitting: Family Medicine

## 2019-10-09 ENCOUNTER — Encounter: Payer: Self-pay | Admitting: Family Medicine

## 2019-10-09 NOTE — Telephone Encounter (Signed)
Patients are unable to retest for at least 14 days.

## 2019-10-15 ENCOUNTER — Encounter: Payer: Self-pay | Admitting: Family Medicine

## 2019-10-16 ENCOUNTER — Other Ambulatory Visit: Payer: Self-pay

## 2019-10-16 DIAGNOSIS — Z20822 Contact with and (suspected) exposure to covid-19: Secondary | ICD-10-CM

## 2019-10-18 LAB — NOVEL CORONAVIRUS, NAA: SARS-CoV-2, NAA: DETECTED — AB

## 2019-11-04 ENCOUNTER — Ambulatory Visit: Payer: BC Managed Care – PPO | Admitting: Family Medicine

## 2019-11-27 ENCOUNTER — Encounter: Payer: Self-pay | Admitting: Family Medicine

## 2019-12-08 ENCOUNTER — Ambulatory Visit (INDEPENDENT_AMBULATORY_CARE_PROVIDER_SITE_OTHER): Payer: BC Managed Care – PPO | Admitting: Licensed Clinical Social Worker

## 2019-12-08 DIAGNOSIS — F3341 Major depressive disorder, recurrent, in partial remission: Secondary | ICD-10-CM

## 2019-12-15 ENCOUNTER — Ambulatory Visit (INDEPENDENT_AMBULATORY_CARE_PROVIDER_SITE_OTHER): Payer: BC Managed Care – PPO | Admitting: Family Medicine

## 2019-12-15 ENCOUNTER — Other Ambulatory Visit: Payer: Self-pay

## 2019-12-15 ENCOUNTER — Encounter: Payer: Self-pay | Admitting: Family Medicine

## 2019-12-15 VITALS — BP 110/80 | HR 102 | Ht 65.0 in | Wt 208.0 lb

## 2019-12-15 DIAGNOSIS — M722 Plantar fascial fibromatosis: Secondary | ICD-10-CM | POA: Diagnosis not present

## 2019-12-15 NOTE — Assessment & Plan Note (Signed)
Patient has made some progress.  We discussed different treatment options including nitroglycerin, continuing home exercise, icing regimen, patient is to increase activity slowly.  Patient will be referred to formal physical therapy that I think will be also potentially beneficial follow-up again in 4 to 8 weeks

## 2019-12-15 NOTE — Progress Notes (Signed)
[facility] [address] [place] Phone: [phone] Subjective:   This visit occurred during the SARS-CoV-2 public health emergency.  Safety protocols were in place, including screening questions prior to the visit, additional usage of staff PPE, and extensive cleaning of exam room while observing appropriate contact time as indicated for disinfecting solutions.   I'm seeing this patient by the request  of:  Copland, Gay Filler, MD  CC: Left foot pain follow-up  IOE:[id number]   09/21/2019 We reviewed that stretching is critically important to the treatment of PF.  Reviewed footwear. Rigid soles have been shown to help with PF. Night splints can sometimes help. Reviewed rehab of stretching and calf raises.   Could benefit from a corticosteroid injection, orthotics, or other measures if conservative treatment fails. Discussed with patient about which activities to do which was to avoid.  Patient will increase activity as tolerated.  Follow-up again in 4 to 8 weeks  Update 12/15/2019 Patricia Morse is a 34 y.o. female coming in with complaint of left foot pain. Patient states that she has felt improvement. Uses ice prn. Pain is worse near end of her day. Did try orthotics but went back to her old insoles.      Past Medical History:  Diagnosis Date  . Allergy   . Auditory neuropathy BILATERAL  . Bilateral sensorineural hearing loss    SECONDARY AUDITORY NEUROPATHY  . 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 Depression   . 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 Internal derangement of right knee   . PONV (postoperative nausea and vomiting) POSTOP COCHLEAR  IMPLANT 2006  . Seasonal allergies    Past Surgical History:  Procedure Laterality Date  . BILATERAL MIDDLE EAR EXPLORATION/ SEALING OF OVAL AND ROUND WINDOW FISTULAS  1999 (AGE 27)   PERILYMPH FISTULAS  . CHONDROPLASTY   03/13/2012   Procedure: CHONDROPLASTY;  Surgeon: Mauri Pole, MD;  [facility]: [facility];  Service: Orthopedics;  Laterality: Right;  . COCHLEAR IMPLANT  11-15-2005  (AGE 15)   LEFT EAR  (CLARION HIGH-RESOLUTION 90K MULTICHANNEL,  1-J ELECTRODE)  . COCHLEAR IMPLANT Right 11/12/2018   Procedure: COCHLEAR IMPLANT RIGHT EAR;  Surgeon: Vicie Mutters, MD;  Location: [facility];  Service: ENT;  Laterality: Right;  . KNEE ARTHROSCOPY  03/13/2012   Procedure: ARTHROSCOPY KNEE;  Surgeon: Mauri Pole, MD;  Location: [facility];  Service: Orthopedics;  Laterality: Right;  RIGHT KNEE DIAGNOSTIC AND OPERATIVE SCOPE  . WISDOM TOOTH EXTRACTION  AGE 77   DENTAL OFFICE   Social History   Socioeconomic History  . Marital status: Single    Spouse name: Not on file  . Number of children: 0  . Years of education: Not on file  . Highest education level: Not on file  Occupational History  . Occupation: Patent examiner for [employer]    Employer: [employer]  Tobacco Use  . Smoking status: Former Smoker    Types: Cigarettes    Quit date: 03/10/2008    Years since quitting: 11.7  . Smokeless tobacco: Never Used  . Tobacco comment: SOCIAL SMOKER IN COLLEGE FOR 36YRS -- NO SMOKING SINCE  Substance and Sexual Activity  . Alcohol use: Yes    Alcohol/week: 3.0 - 5.0 standard drinks    Types: 3 - 5 Glasses of wine per week  Comment: OCCASIONAL, "social"   . Drug use: No  . Sexual activity: Yes    Partners: Male    Birth control/protection: Pill  Other Topics Concern  . Not on file  Social History Narrative   At 45 months of age, pt had encephalitis vs. Guillain-Barre Syndrome (MD's unsure, therefore, doesn't take flu shot)   Social Determinants of Health   Financial Resource Strain:   . Difficulty of Paying Living Expenses: Not on file  Food Insecurity:   . Worried About Programme researcher, broadcasting/film/video in the Last Year: Not on file  . Ran Out of Food in  the Last Year: Not on file  Transportation Needs:   . Lack of Transportation (Medical): Not on file  . Lack of Transportation (Non-Medical): Not on file  Physical Activity:   . Days of Exercise per Week: Not on file  . Minutes of Exercise per Session: Not on file  Stress:   . Feeling of Stress : Not on file  Social Connections:   . Frequency of Communication with Friends and Family: Not on file  . Frequency of Social Gatherings with Friends and Family: Not on file  . Attends Religious Services: Not on file  . Active Member of Clubs or Organizations: Not on file  . Attends Banker Meetings: Not on file  . Marital Status: Not on file   Allergies  Allergen Reactions  . Amoxicillin Hives  . Tdap [Tetanus-Diphth-Acell Pertussis]   . Hydrocodeine [Dihydrocodeine] Rash  . Penicillins Rash   Family History  Problem Relation Age of Onset  . Depression Mother   . Hypertension Mother   . Mental illness Mother   . Anxiety disorder Maternal Grandmother     Current Outpatient Medications (Endocrine & Metabolic):  Marland Kitchen  APRI 0.15-30 MG-MCG tablet,    Current Outpatient Medications (Respiratory):  .  cetirizine (ZYRTEC) 10 MG tablet, Take 10 mg by mouth daily. .  fluticasone (FLONASE) 50 MCG/ACT nasal spray, Place 2 sprays into both nostrils daily.    Current Outpatient Medications (Other):  Marland Kitchen  ARIPiprazole (ABILIFY) 2 MG tablet, Take 2 mg by mouth daily. Marland Kitchen  doxycycline (VIBRAMYCIN) 100 MG capsule, Take 1 capsule (100 mg total) by mouth 2 (two) times daily. .  hydrOXYzine (VISTARIL) 25 MG capsule, Take 1 capsule (25 mg total) by mouth 3 (three) times daily as needed. .  Multiple Vitamins-Minerals (MULTIVITAMIN WITH MINERALS) tablet, Take 1 tablet by mouth daily. .  Olopatadine HCl (PATADAY) 0.2 % SOLN, Apply 1 drop to eyes daily as needed for allergies .  Probiotic Product (PROBIOTIC DAILY PO), Take by mouth. .  triamcinolone (KENALOG) 0.1 % paste, Apply to canker sore  twice a day as needed .  valACYclovir (VALTREX) 1000 MG tablet, TAKE 2 TABLETS TWICE A DAY FOR 2 DOSES FOR COLD SORES .  FLUoxetine (PROZAC) 40 MG capsule, Take 1 capsule (40 mg total) by mouth daily.    Past medical history, social, surgical and family history all reviewed in electronic medical record.  No pertanent information unless stated regarding to the chief complaint.   Review of Systems:  No headache, visual changes, nausea, vomiting, diarrhea, constipation, dizziness, abdominal pain, skin rash, fevers, chills, night sweats, weight loss, swollen lymph nodes, body aches, joint swelling, chest pain, shortness of breath, mood changes. POSITIVE muscle aches  Objective  Blood pressure 110/80, pulse (!) 102, height 5\' 5"  (1.651 m), weight 208 lb (94.3 kg), SpO2 98 %.   General: No apparent distress alert  and oriented x3 mood and affect normal, dressed appropriately.  HEENT: Pupils equal, extraocular movements intact  Respiratory: Patient's speak in full sentences and does not appear short of breath  Cardiovascular: No lower extremity edema, non tender, no erythema  Skin: Warm dry intact with no signs of infection or rash on extremities or on axial skeleton.  Abdomen: Soft nontender  Neuro: Cranial nerves II through XII are intact, neurovascularly intact in all extremities with 2+ DTRs and 2+ pulses.  Lymph: No lymphadenopathy of posterior or anterior cervical chain or axillae bilaterally.  Gait normal with good balance and coordination.  MSK:  Non tender with full range of motion and good stability and symmetric strength and tone of shoulders, elbows, wrist, hip, knee and ankles bilaterally.  Left foot exam still shows the patient does have some breakdown of the longitudinal arch noted.  Some tenderness to palpation over the medial calcaneal area.  Patient does have some tightness of the posterior cord of the ankle with some mild overpronation of the hindfoot noted.   Impression and  Recommendations:     This case required medical decision making of moderate complexity. The above documentation has been reviewed and is accurate and complete Judi Saa, DO       Note: This dictation was prepared with Dragon dictation along with smaller phrase technology. Any transcriptional errors that result from this process are unintentional.

## 2019-12-15 NOTE — Patient Instructions (Signed)
PT will call you Try to not be barefoot See me in 6 weeks

## 2019-12-21 ENCOUNTER — Encounter: Payer: Self-pay | Admitting: Family Medicine

## 2019-12-24 NOTE — Telephone Encounter (Signed)
Does patient need to go to ER or is she ok waiting until Wednesday?

## 2019-12-24 NOTE — Telephone Encounter (Signed)
Called her to check in - she noted mild intermittent chest pressure,  Able to breathe ok I recommended that she be seen sooner if not doing ok or getting worse and she agrees For now plans to be seen Wednesday so she won't have to miss work

## 2019-12-27 ENCOUNTER — Encounter: Payer: Self-pay | Admitting: Family Medicine

## 2019-12-28 ENCOUNTER — Ambulatory Visit: Payer: BC Managed Care – PPO | Admitting: Physical Therapy

## 2019-12-29 ENCOUNTER — Ambulatory Visit (INDEPENDENT_AMBULATORY_CARE_PROVIDER_SITE_OTHER): Payer: BC Managed Care – PPO | Admitting: Licensed Clinical Social Worker

## 2019-12-29 DIAGNOSIS — F3341 Major depressive disorder, recurrent, in partial remission: Secondary | ICD-10-CM | POA: Diagnosis not present

## 2019-12-30 ENCOUNTER — Ambulatory Visit: Payer: BC Managed Care – PPO | Admitting: Family Medicine

## 2020-01-05 ENCOUNTER — Other Ambulatory Visit: Payer: Self-pay

## 2020-01-05 NOTE — Progress Notes (Addendum)
[facility] [place], [place], [place] [address] [phone]  Date:  01/06/2020   Name:  Patricia Morse   DOB:  [birth year]   MRN:  [id number]  PCP:  Darreld Mclean, MD    Chief Complaint: Chest Pain (covid in november, left side, pain with breathing in, worse with anxiety)   History of Present Illness:  Patricia Morse is a 34 y.o. very pleasant female patient who presents with the following:  Patient with history of hearing loss, status post cochlear implant [name] does still depend on lipreading to a certain degree (impossible with facemask), I asked her to let me know if she could not hear any part of our interaction so I could repeat and ensure that she understands Here today for concern of chest pain She did have COVID-19 in November of last year  She has noted some chest pains since January, more severe for about 2.5 weeks May be associated with anxiety -she is getting married on May 1, she is under some stress Her chest pains occur with stress, or when she takes a deep breath She does not think her pains are exertional She has not been exercising much due to a foot problem However when she walked recently she felt fine as far as her chest symptoms  She would like to try doing more biking for exercise due to her foot problem She is not coughing  She did have a fever during COVID-19 infection but now ok   No personal or family history of DVT or PE  She does take OCP She does not smoke  Her MGF did have CABG, father has hyperlipidemia   Patient Active Problem List   Diagnosis Date Noted  . Plantar fasciitis of left foot 09/21/2019  . Postoperative observation 11/12/2018  . Visual disturbance 04/25/2016  . Dizziness 04/25/2016  . Obesity, unspecified 06/08/2013  . Depression 10/10/2012  . Hearing loss 10/10/2012    Past Medical History:  Diagnosis Date  . Allergy   . Auditory  neuropathy BILATERAL  . Bilateral sensorineural hearing loss    SECONDARY AUDITORY NEUROPATHY  . 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 Depression   . 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 Internal derangement of right knee   . PONV (postoperative nausea and vomiting) POSTOP COCHLEAR  IMPLANT 2006  . Seasonal allergies     Past Surgical History:  Procedure Laterality Date  . BILATERAL MIDDLE EAR EXPLORATION/ SEALING OF OVAL AND ROUND WINDOW FISTULAS  1999 (AGE 51)   PERILYMPH FISTULAS  . CHONDROPLASTY  03/13/2012   Procedure: CHONDROPLASTY;  Surgeon: Mauri Pole, MD;  Location: [facility];  Service: Orthopedics;  Laterality: Right;  . COCHLEAR IMPLANT  11-15-2005  (AGE 58)   LEFT EAR  (CLARION HIGH-RESOLUTION 90K MULTICHANNEL,  1-J ELECTRODE)  . COCHLEAR IMPLANT Right 11/12/2018   Procedure: COCHLEAR IMPLANT RIGHT EAR;  Surgeon: Vicie Mutters, MD;  Location: [facility];  Service: ENT;  Laterality: Right;  . KNEE ARTHROSCOPY  03/13/2012   Procedure: ARTHROSCOPY KNEE;  Surgeon: Mauri Pole, MD;  Location: [facility];  Service: Orthopedics;  Laterality: Right;  RIGHT KNEE DIAGNOSTIC AND OPERATIVE SCOPE  . WISDOM TOOTH EXTRACTION  AGE 62   DENTAL OFFICE    Social History   Tobacco Use  .  Smoking status: Former Smoker    Types: Cigarettes    Quit date: 03/10/2008    Years since quitting: 11.8  . Smokeless tobacco: Never Used  . Tobacco comment: SOCIAL SMOKER IN COLLEGE FOR 21YRS -- NO SMOKING SINCE  Substance Use Topics  . Alcohol use: Yes    Alcohol/week: 3.0 - 5.0 standard drinks    Types: 3 - 5 Glasses of wine per week    Comment: OCCASIONAL, "social"   . Drug use: No    Family History  Problem Relation Age of Onset  . Depression Mother   . Hypertension Mother   . Mental illness Mother   . Anxiety disorder Maternal Grandmother     Allergies  Allergen Reactions  .  Amoxicillin Hives  . Tdap [Tetanus-Diphth-Acell Pertussis]   . Hydrocodeine [Dihydrocodeine] Rash  . Penicillins Rash    Medication list has been reviewed and updated.  Current Outpatient Medications on File Prior to Visit  Medication Sig Dispense Refill  . APRI 0.15-30 MG-MCG tablet   5  . busPIRone (BUSPAR) 15 MG tablet     . cetirizine (ZYRTEC) 10 MG tablet Take 10 mg by mouth daily.    Marland Kitchen desvenlafaxine (PRISTIQ) 100 MG 24 hr tablet     . hydrOXYzine (VISTARIL) 25 MG capsule Take 1 capsule (25 mg total) by mouth 3 (three) times daily as needed. 30 capsule 0  . Probiotic Product (PROBIOTIC DAILY PO) Take by mouth.    . triamcinolone (KENALOG) 0.1 % paste Apply to canker sore twice a day as needed 5 g 2  . valACYclovir (VALTREX) 1000 MG tablet TAKE 2 TABLETS TWICE A DAY FOR 2 DOSES FOR COLD SORES 30 tablet 7  . FLUoxetine (PROZAC) 40 MG capsule Take 1 capsule (40 mg total) by mouth daily. 90 capsule 1   No current facility-administered medications on file prior to visit.    Review of Systems:  As per HPI- otherwise negative.  BP Readings from Last 3 Encounters:  01/06/20 110/80  12/15/19 110/80  09/21/19 112/80    Physical Examination: Vitals:   01/06/20 1526  BP: 110/80  Pulse: 92  Resp: 16  Temp: (!) 96.5 F (35.8 C)  SpO2: 98%   Vitals:   01/06/20 1526  Weight: 200 lb (90.7 kg)  Height: 5\' 5"  (1.651 m)   Body mass index is 33.28 kg/m. Ideal Body Weight: Weight in (lb) to have BMI = 25: 149.9  GEN: no acute distress.  Overweight, looks well.  Patient has cochlear implant HEENT: Atraumatic, Normocephalic.  Ears and Nose: No external deformity. CV: RRR, No M/G/R. No JVD. No thrill. No extra heart sounds. PULM: CTA B, no wheezes, crackles, rhonchi. No retractions. No resp. distress. No accessory muscle use. I am able to reproduce her chest tenderness by pressing on the left anterior chest wall superior to the breast No redness, swelling, skin change ABD: S,  NT, ND, +BS. No rebound. No HSM. EXTR: No c/c/e PSYCH: Normally interactive. Conversant.   EKG: Normal sinus rhythm, no acute changes when compared with tracing from October 2019.  No concerning ST changes  Results for orders placed or performed in visit on 01/06/20  POCT urine pregnancy  Result Value Ref Range   Preg Test, Ur Negative Negative    Assessment and Plan: Chest pain, unspecified type - Plan: EKG 12-Lead, CBC, Comprehensive metabolic panel, D-Dimer, Quantitative, DG Chest 2 View, POCT urine pregnancy  Here today with chest pain since January, worse the last 2-1/2 weeks  Differential diagnoses includes musculoskeletal pain, cardiac, pulmonary/PE Due to patient age and risk factors as well as physical exam, MSK pain seems most likely EKG reassuring, will get chest film today  Labs pending, if D dimer positive consider Ct angiogram. Pt states understanding and agreement with plan   Moderate med decision making today  This visit occurred during the SARS-CoV-2 public health emergency.  Safety protocols were in place, including screening questions prior to the visit, additional usage of staff PPE, and extensive cleaning of exam room while observing appropriate contact time as indicated for disinfecting solutions.     Signed Abbe Amsterdam, MD  Received her chest film, message to pt  DG Chest 2 View  Result Date: 01/06/2020 CLINICAL DATA:  Chest pain  covid last novemebr EXAM: CHEST - 2 VIEW COMPARISON:  Chest radiograph 01/01/2011 FINDINGS: The heart size and mediastinal contours are within normal limits. The lungs are clear. No pneumothorax or pleural effusion. The visualized skeletal structures are unremarkable. IMPRESSION: No active cardiopulmonary disease. Electronically Signed   By: Emmaline Kluver M.D.   On: 01/06/2020 16:24    Received her labs 2/11- message to pt  Results for orders placed or performed in visit on 01/06/20  CBC  Result Value Ref Range   WBC  7.6 4.0 - 10.5 K/uL   RBC 4.52 3.87 - 5.11 Mil/uL   Platelets 284.0 150.0 - 400.0 K/uL   Hemoglobin 13.1 12.0 - 15.0 g/dL   HCT 63.7 85.8 - 85.0 %   MCV 86.0 78.0 - 100.0 fl   MCHC 33.8 30.0 - 36.0 g/dL   RDW 27.7 41.2 - 87.8 %  Comprehensive metabolic panel  Result Value Ref Range   Sodium 137 135 - 145 mEq/L   Potassium 3.8 3.5 - 5.1 mEq/L   Chloride 102 96 - 112 mEq/L   CO2 26 19 - 32 mEq/L   Glucose, Bld 78 70 - 99 mg/dL   BUN 12 6 - 23 mg/dL   Creatinine, Ser 6.76 0.40 - 1.20 mg/dL   Total Bilirubin 0.3 0.2 - 1.2 mg/dL   Alkaline Phosphatase 67 39 - 117 U/L   AST 19 0 - 37 U/L   ALT 18 0 - 35 U/L   Total Protein 7.0 6.0 - 8.3 g/dL   Albumin 3.9 3.5 - 5.2 g/dL   GFR 72.09 >47.09 mL/min   Calcium 9.2 8.4 - 10.5 mg/dL  D-Dimer, Quantitative  Result Value Ref Range   D-Dimer, Quant 0.23 <0.50 mcg/mL FEU  POCT urine pregnancy  Result Value Ref Range   Preg Test, Ur Negative Negative

## 2020-01-06 ENCOUNTER — Encounter: Payer: Self-pay | Admitting: Family Medicine

## 2020-01-06 ENCOUNTER — Other Ambulatory Visit: Payer: Self-pay

## 2020-01-06 ENCOUNTER — Ambulatory Visit (INDEPENDENT_AMBULATORY_CARE_PROVIDER_SITE_OTHER): Payer: BC Managed Care – PPO | Admitting: Family Medicine

## 2020-01-06 ENCOUNTER — Ambulatory Visit (HOSPITAL_BASED_OUTPATIENT_CLINIC_OR_DEPARTMENT_OTHER)
Admission: RE | Admit: 2020-01-06 | Discharge: 2020-01-06 | Disposition: A | Discharge status: Home / Self Care | Payer: BC Managed Care – PPO | Source: Ambulatory Visit | Attending: Family Medicine | Admitting: Family Medicine

## 2020-01-06 VITALS — BP 110/80 | HR 92 | Temp 96.5°F | Resp 16 | Ht 65.0 in | Wt 200.0 lb

## 2020-01-06 DIAGNOSIS — R079 Chest pain, unspecified: Secondary | ICD-10-CM

## 2020-01-06 LAB — POCT URINE PREGNANCY: Preg Test, Ur: NEGATIVE

## 2020-01-06 NOTE — Patient Instructions (Signed)
I suspect your chest pain is musculoskeletal- Please go to lab and then to the imaging dept on the ground floor for a chest film   If your D dimer is positive we will need to consider a CT scan to rule out a pulmonary embolus

## 2020-01-07 ENCOUNTER — Encounter: Payer: Self-pay | Admitting: Family Medicine

## 2020-01-07 LAB — COMPREHENSIVE METABOLIC PANEL
ALT: 18 U/L (ref 0–35)
AST: 19 U/L (ref 0–37)
Albumin: 3.9 g/dL (ref 3.5–5.2)
Alkaline Phosphatase: 67 U/L (ref 39–117)
BUN: 12 mg/dL (ref 6–23)
CO2: 26 mEq/L (ref 19–32)
Calcium: 9.2 mg/dL (ref 8.4–10.5)
Chloride: 102 mEq/L (ref 96–112)
Creatinine, Ser: 0.81 mg/dL (ref 0.40–1.20)
GFR: 81.9 mL/min (ref >60.00)
Glucose, Bld: 78 mg/dL (ref 70–99)
Potassium: 3.8 mEq/L (ref 3.5–5.1)
Sodium: 137 mEq/L (ref 135–145)
Total Bilirubin: 0.3 mg/dL (ref 0.2–1.2)
Total Protein: 7 g/dL (ref 6.0–8.3)

## 2020-01-07 LAB — CBC
HCT: 38.9 % (ref 36.0–46.0)
Hemoglobin: 13.1 g/dL (ref 12.0–15.0)
MCHC: 33.8 g/dL (ref 30.0–36.0)
MCV: 86 fl (ref 78.0–100.0)
Platelets: 284 10*3/uL (ref 150.0–400.0)
RBC: 4.52 Mil/uL (ref 3.87–5.11)
RDW: 12.8 % (ref 11.5–15.5)
WBC: 7.6 10*3/uL (ref 4.0–10.5)

## 2020-01-07 LAB — D-DIMER, QUANTITATIVE: D-Dimer, Quant: 0.23 mcg/mL FEU (ref <0.50)

## 2020-01-18 ENCOUNTER — Encounter: Payer: Self-pay | Admitting: Family Medicine

## 2020-01-19 ENCOUNTER — Ambulatory Visit (INDEPENDENT_AMBULATORY_CARE_PROVIDER_SITE_OTHER): Payer: BC Managed Care – PPO | Admitting: Licensed Clinical Social Worker

## 2020-01-19 DIAGNOSIS — F3341 Major depressive disorder, recurrent, in partial remission: Secondary | ICD-10-CM | POA: Diagnosis not present

## 2020-01-20 ENCOUNTER — Other Ambulatory Visit: Payer: Self-pay

## 2020-01-20 ENCOUNTER — Encounter: Payer: Self-pay | Admitting: Physical Therapy

## 2020-01-20 ENCOUNTER — Ambulatory Visit: Payer: BC Managed Care – PPO | Admitting: Physical Therapy

## 2020-01-20 DIAGNOSIS — R2689 Other abnormalities of gait and mobility: Secondary | ICD-10-CM | POA: Diagnosis not present

## 2020-01-20 DIAGNOSIS — M25572 Pain in left ankle and joints of left foot: Secondary | ICD-10-CM

## 2020-01-20 DIAGNOSIS — M25672 Stiffness of left ankle, not elsewhere classified: Secondary | ICD-10-CM | POA: Diagnosis not present

## 2020-01-21 NOTE — Telephone Encounter (Signed)
I called [facility] and spoke with one of the pharmacist.  She consulted infectious disease pharmacy specialist and call me back.  There have not been any reported cases of Guillain-Barr or viral encephalitis following COVID-19 vaccine.  We do not believe the patient has any contraindications to receiving the vaccine.  I sent the patient a MyChart message with this update

## 2020-01-25 ENCOUNTER — Encounter: Payer: Self-pay | Admitting: Physical Therapy

## 2020-01-25 ENCOUNTER — Other Ambulatory Visit: Payer: Self-pay

## 2020-01-25 ENCOUNTER — Ambulatory Visit: Payer: BC Managed Care – PPO | Admitting: Physical Therapy

## 2020-01-25 DIAGNOSIS — M25572 Pain in left ankle and joints of left foot: Secondary | ICD-10-CM | POA: Diagnosis not present

## 2020-01-25 DIAGNOSIS — M25672 Stiffness of left ankle, not elsewhere classified: Secondary | ICD-10-CM | POA: Diagnosis not present

## 2020-01-25 DIAGNOSIS — R2689 Other abnormalities of gait and mobility: Secondary | ICD-10-CM

## 2020-01-25 NOTE — Therapy (Signed)
[facility] [address] [place], [place], [address] Phone: [phone]   Fax:  [phone]  Physical Therapy Treatment  Patient Details  Name: Patricia Morse MRN: [id number] Date of Birth: 07/12/1986 Referring Provider (PT): Terrilee Files   Encounter Date: 01/25/2020  PT End of Session - 01/25/20 2121    Visit Number  2    Number of Visits  12    Date for PT Re-Evaluation  03/03/20    Authorization Type  BCBS  state health    PT Start Time  1517    PT Stop Time  1600    PT Time Calculation (min)  43 min    Activity Tolerance  Patient tolerated treatment well    Behavior During Therapy  [facility] for tasks assessed/performed       Past Medical History:  Diagnosis Date  . Allergy   . Auditory neuropathy BILATERAL  . Bilateral sensorineural hearing loss    SECONDARY AUDITORY NEUROPATHY  . 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 Depression   . 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 Internal derangement of right knee   . PONV (postoperative nausea and vomiting) POSTOP COCHLEAR  IMPLANT 2006  . Seasonal allergies     Past Surgical History:  Procedure Laterality Date  . BILATERAL MIDDLE EAR EXPLORATION/ SEALING OF OVAL AND ROUND WINDOW FISTULAS  1999 (AGE 68)   PERILYMPH FISTULAS  . CHONDROPLASTY  03/13/2012   Procedure: CHONDROPLASTY;  Surgeon: Shelda Pal, MD;  Location: [facility];  Service: Orthopedics;  Laterality: Right;  . COCHLEAR IMPLANT  11-15-2005  (AGE 45)   LEFT EAR  (CLARION HIGH-RESOLUTION 90K MULTICHANNEL,  1-J ELECTRODE)  . COCHLEAR IMPLANT Right 11/12/2018   Procedure: COCHLEAR IMPLANT RIGHT EAR;  Surgeon: Ermalinda Barrios, MD;  Location: [facility];  Service: ENT;  Laterality: Right;  . KNEE ARTHROSCOPY  03/13/2012   Procedure: ARTHROSCOPY KNEE;  Surgeon: Shelda Pal, MD;  Location[facility];  Service: Orthopedics;   Laterality: Right;  RIGHT KNEE DIAGNOSTIC AND OPERATIVE SCOPE  . WISDOM TOOTH EXTRACTION  AGE 49   DENTAL OFFICE    There were no vitals filed for this visit.  Subjective Assessment - 01/25/20 2120    Subjective  Pt states pain in heel with standing, walking    Patient Stated Goals  Decreased pain in foot    Currently in Pain?  Yes    Pain Score  5     Pain Location  Foot    Pain Orientation  Left    Pain Descriptors / Indicators  Aching;Sore    Pain Type  Chronic pain    Pain Onset  More than a month ago    Pain Frequency  Intermittent                       [facility] Adult PT Treatment/Exercise - 01/25/20 1522      Exercises   Exercises  Ankle      Modalities   Modalities  Iontophoresis      Iontophoresis   Type of Iontophoresis  Dexamethasone    Location  L heel    Time  4 hr patch      Manual Therapy   Manual Therapy  Soft tissue mobilization;Joint mobilization    Soft tissue mobilization  DTM to R heel and pl fascia,  and distal achilles, gastroc       Ankle Exercises: Stretches   Soleus Stretch  2 reps;20 seconds    Gastroc Stretch  2 reps;30 seconds    Other Stretch  DF glides, kneeling x10 bil;       Ankle Exercises: Aerobic   Stationary Bike  L 1 x 7 min;       Ankle Exercises: Standing   Heel Raises  20 reps    Heel Raises Limitations  with ball squeeze               PT Short Term Goals - 01/25/20 0826      PT SHORT TERM GOAL #1   Title  Pt to be independent with initial HEP    Time  2    Status  New    Target Date  02/04/20      PT SHORT TERM GOAL #2   Title  Pt to report decreased pain in L foot, to 4/10 with walking    Time  3    Period  Weeks    Status  New    Target Date  02/11/20        PT Long Term Goals - 01/25/20 0827      PT LONG TERM GOAL #1   Title  Pt to be independent with final HEP    Time  6    Period  Weeks    Status  New    Target Date  03/03/20      PT LONG TERM GOAL #2   Title  Pt to report  decreased pain in L foot to 0-2/10 with standing and walking activity    Time  6    Period  Weeks    Status  New    Target Date  03/03/20      PT LONG TERM GOAL #3   Title  Pt to demo improved DF by at least 3 deg bilaterally , to improve gait and pain    Time  6    Period  Weeks    Status  New    Target Date  03/03/20      PT LONG TERM GOAL #4   Title  Pt to demo improved ability for SLS, up to 30 sec bilaterally, for improved stability    Time  6    Period  Weeks    Status  New    Target Date  03/03/20      PT LONG TERM GOAL #5   Title  Pt to be compliant with footwear and/or orthotics, appropriate for her foot type.    Time  6    Period  Weeks    Status  New    Target Date  03/03/20            Plan - 01/25/20 2124    Clinical Impression Statement  Pt with soreness at base of L heel, manual and Ionto done for pain. Pt with limited DF, ther ex focused on improivng ROM, HEP reviewed. Plan to progress as tolerated.    Personal Factors and Comorbidities  Time since onset of injury/illness/exacerbation    Examination-Activity Limitations  Locomotion Level;Squat;Stairs;Stand    Examination-Participation Restrictions  Meal Prep;Cleaning;Community Activity;Driving;Shop;Laundry    Stability/Clinical Decision Making  Stable/Uncomplicated    Rehab Potential  Good    PT Frequency  2x / week    PT Duration  6 weeks    PT Treatment/Interventions  ADLs/Self Care Home Management;Electrical  Stimulation;Cryotherapy;Gait training;DME Instruction;Ultrasound;Moist Heat;Iontophoresis 4mg /ml Dexamethasone;Stair training;Functional mobility training;Therapeutic activities;Therapeutic exercise;Balance training;Neuromuscular re-education;Manual techniques;Orthotic Fit/Training;Patient/family education;Passive range of motion;Dry needling;Taping;Joint Manipulations;Spinal Manipulations;Vasopneumatic Device    Consulted and Agree with Plan of Care  Patient       Patient will benefit from  skilled therapeutic intervention in order to improve the following deficits and impairments:  Abnormal gait, Decreased coordination, Decreased range of motion, Decreased endurance, Decreased activity tolerance, Pain, Hypomobility, Impaired flexibility, Improper body mechanics, Decreased balance, Decreased mobility, Decreased strength  Visit Diagnosis: Pain in left ankle and joints of left foot  Stiffness of left ankle, not elsewhere classified  Other abnormalities of gait and mobility     Problem List Patient Active Problem List   Diagnosis Date Noted  . Plantar fasciitis of left foot 09/21/2019  . Postoperative observation 11/12/2018  . Visual disturbance 04/25/2016  . Dizziness 04/25/2016  . Obesity, unspecified 06/08/2013  . Depression 10/10/2012  . Hearing loss 10/10/2012    Lyndee Hensen, PT, DPT 9:25 PM  01/25/20    [place], [place], [address] Phone: [phone]   Fax:  [phone]  Name: KYRIANNA BARLETTA MRN: [id number] Date of Birth: 10/17/1986

## 2020-01-25 NOTE — Therapy (Signed)
[facility] [address] [place], [place], [address] Phone: [phone]   Fax:  [phone]  Physical Therapy Evaluation  Patient Details  Name: Patricia Morse MRN: [id number] Date of Birth: 01-01-1986 Referring Provider (PT): Terrilee Files   Encounter Date: 01/20/2020  PT End of Session - 01/25/20 0824    Visit Number  1    Number of Visits  12    Date for PT Re-Evaluation  03/03/20    Authorization Type  BCBS  state health    PT Start Time  1602    PT Stop Time  1648    PT Time Calculation (min)  46 min    Activity Tolerance  Patient tolerated treatment well    Behavior During Therapy  [facility] for tasks assessed/performed       Past Medical History:  Diagnosis Date  . Allergy   . Auditory neuropathy BILATERAL  . Bilateral sensorineural hearing loss    SECONDARY AUDITORY NEUROPATHY  . 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 Depression   . 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 Internal derangement of right knee   . PONV (postoperative nausea and vomiting) POSTOP COCHLEAR  IMPLANT 2006  . Seasonal allergies     Past Surgical History:  Procedure Laterality Date  . BILATERAL MIDDLE EAR EXPLORATION/ SEALING OF OVAL AND ROUND WINDOW FISTULAS  1999 (AGE 28)   PERILYMPH FISTULAS  . CHONDROPLASTY  03/13/2012   Procedure: CHONDROPLASTY;  Surgeon: Shelda Pal, MD;  Location: [facility];  Service: Orthopedics;  Laterality: Right;  . COCHLEAR IMPLANT  11-15-2005  (AGE 4)   LEFT EAR  (CLARION HIGH-RESOLUTION 90K MULTICHANNEL,  1-J ELECTRODE)  . COCHLEAR IMPLANT Right 11/12/2018   Procedure: COCHLEAR IMPLANT RIGHT EAR;  Surgeon: Ermalinda Barrios, MD;  Location: [facility];  Service: ENT;  Laterality: Right;  . KNEE ARTHROSCOPY  03/13/2012   Procedure: ARTHROSCOPY KNEE;  Surgeon: Shelda Pal, MD;  Location: [facility];  Service: Orthopedics;   Laterality: Right;  RIGHT KNEE DIAGNOSTIC AND OPERATIVE SCOPE  . WISDOM TOOTH EXTRACTION  AGE 49   DENTAL OFFICE    There were no vitals filed for this visit.   Subjective Assessment - 01/25/20 0822    Subjective  Pt states increased pain in L foot, onoing for about 1 year. Has tried different shoes and inserts, without much improvment. Wearing Mizunos, and OTC 1/2 insert, appears over corrected. States multiple ankle sprains in last 2 years. Works as Environmental health practitioner.    Limitations  Standing;Walking;House hold activities    Patient Stated Goals  Decreased pain in foot    Currently in Pain?  Yes    Pain Score  6     Pain Location  Foot    Pain Orientation  Left    Pain Descriptors / Indicators  Aching;Sore    Pain Type  Chronic pain    Pain Onset  More than a month ago    Pain Frequency  Intermittent    Aggravating Factors   First thing in AM, after prolonged actiity, at end of work day    Pain Relieving Factors  rest         [facility] PT Assessment - 01/25/20 0001      Assessment   Medical Diagnosis  L foot pain    Referring Provider (PT)  Terrilee Files  Prior Therapy  no      Balance Screen   Has the patient fallen in the past 6 months  No      Prior Function   Level of Independence  Independent      Cognition   Overall Cognitive Status  Within Functional Limits for tasks assessed      ROM / Strength   AROM / PROM / Strength  AROM;Strength      AROM   Overall AROM Comments  Hip: WNL, Knee: WNL    AROM Assessment Site  Ankle    Right/Left Ankle  Right;Left    Right Ankle Dorsiflexion  5    Right Ankle Plantar Flexion  --   wfl   Right Ankle Inversion  --   wfl   Right Ankle Eversion  --   wfl   Left Ankle Dorsiflexion  4    Left Ankle Plantar Flexion  --   wfl   Left Ankle Inversion  --   wfl   Left Ankle Eversion  --   wfl     Strength   Overall Strength Comments  Hip: 4-/5;  R Knee: 4/5,  L Knee: 4+/5    Strength Assessment Site  Ankle     Right/Left Ankle  Left    Left Ankle Dorsiflexion  4-/5    Left Ankle Plantar Flexion  4-/5    Left Ankle Inversion  4-/5      Palpation   Palpation comment  Pain at base of pl fascia, and flexor digitorum.   Hypomobile DF bilaterally L>R      Special Tests   Other special tests  SLS: 6 sec L ,  10 sec R                 Objective measurements completed on examination: See above findings.      [facility] Adult PT Treatment/Exercise - 01/25/20 0001      Exercises   Exercises  Ankle      Ankle Exercises: Stretches   Soleus Stretch  2 reps;20 seconds    Gastroc Stretch  2 reps;30 seconds    Other Stretch  DF glides, kneeling x10 bil;              PT Education - 01/25/20 3953    Education Details  Initial HEP for DF ROM, HEP, PT POC    Person(s) Educated  Patient    Methods  Explanation;Demonstration;Tactile cues;Verbal cues;Handout    Comprehension  Verbalized understanding;Returned demonstration;Verbal cues required;Tactile cues required;Need further instruction       PT Short Term Goals - 01/25/20 0826      PT SHORT TERM GOAL #1   Title  Pt to be independent with initial HEP    Time  2    Status  New    Target Date  02/04/20      PT SHORT TERM GOAL #2   Title  Pt to report decreased pain in L foot, to 4/10 with walking    Time  3    Period  Weeks    Status  New    Target Date  02/11/20        PT Long Term Goals - 01/25/20 0827      PT LONG TERM GOAL #1   Title  Pt to be independent with final HEP    Time  6    Period  Weeks    Status  New    Target Date  03/03/20      PT LONG TERM GOAL #2   Title  Pt to report decreased pain in L foot to 0-2/10 with standing and walking activity    Time  6    Period  Weeks    Status  New    Target Date  03/03/20      PT LONG TERM GOAL #3   Title  Pt to demo improved DF by at least 3 deg bilaterally , to improve gait and pain    Time  6    Period  Weeks    Status  New    Target Date  03/03/20       PT LONG TERM GOAL #4   Title  Pt to demo improved ability for SLS, up to 30 sec bilaterally, for improved stability    Time  6    Period  Weeks    Status  New    Target Date  03/03/20      PT LONG TERM GOAL #5   Title  Pt to be compliant with footwear and/or orthotics, appropriate for her foot type.    Time  6    Period  Weeks    Status  New    Target Date  03/03/20             Plan - 01/25/20 4097    Clinical Impression Statement  Pt presents with primary complaint of increased pain in L foot for about 1 year. Pt with tenderness at base of heel, and increased pain with initial standing, walking, and most weight bearing activities. Pt with hypomobile STJ, with lack of sufficient DF. Pt with decreased stabiltiy and NMC, with poor balance with Single leg activity for her age,( pt relates some to her childhood encephalitis and hearing loss). Pt wearing inapproprate shoe for her foot type, with visible over correction into supination. Pt to benefit from skilled PT to improve pain and deficits.    Personal Factors and Comorbidities  Time since onset of injury/illness/exacerbation    Examination-Activity Limitations  Locomotion Level;Squat;Stairs;Stand    Examination-Participation Restrictions  Meal Prep;Cleaning;Community Activity;Driving;Shop;Laundry    Stability/Clinical Decision Making  Stable/Uncomplicated    Clinical Decision Making  Low    Rehab Potential  Good    PT Frequency  2x / week    PT Duration  6 weeks    PT Treatment/Interventions  ADLs/Self Care Home Management;Electrical Stimulation;Cryotherapy;Gait training;DME Instruction;Ultrasound;Moist Heat;Iontophoresis 4mg /ml Dexamethasone;Stair training;Functional mobility training;Therapeutic activities;Therapeutic exercise;Balance training;Neuromuscular re-education;Manual techniques;Orthotic Fit/Training;Patient/family education;Passive range of motion;Dry needling;Taping;Joint Manipulations;Spinal Manipulations;Vasopneumatic  Device    Consulted and Agree with Plan of Care  Patient       Patient will benefit from skilled therapeutic intervention in order to improve the following deficits and impairments:  Abnormal gait, Decreased coordination, Decreased range of motion, Decreased endurance, Decreased activity tolerance, Pain, Hypomobility, Impaired flexibility, Improper body mechanics, Decreased balance, Decreased mobility, Decreased strength  Visit Diagnosis: Pain in left ankle and joints of left foot  Stiffness of left ankle, not elsewhere classified  Other abnormalities of gait and mobility     Problem List Patient Active Problem List   Diagnosis Date Noted  . Plantar fasciitis of left foot 09/21/2019  . Postoperative observation 11/12/2018  . Visual disturbance 04/25/2016  . Dizziness 04/25/2016  . Obesity, unspecified 06/08/2013  . Depression 10/10/2012  . Hearing loss 10/10/2012   10/12/2012, PT, DPT 8:34 AM  01/25/20    [facility] [address]  [place], [place], [address] Phone: [phone]   Fax:  [phone]  Name: ISELA STANTZ MRN: [id number] Date of Birth: January 20, 1986

## 2020-01-25 NOTE — Patient Instructions (Signed)
Soleus stretch at wall Gastroc stretch at wall... both 30 sec x3 bil, 2x/day Kneeling DF glides x10, 2x/day, bil

## 2020-01-26 ENCOUNTER — Ambulatory Visit: Payer: BC Managed Care – PPO | Admitting: Family Medicine

## 2020-02-01 ENCOUNTER — Other Ambulatory Visit: Payer: Self-pay

## 2020-02-01 ENCOUNTER — Ambulatory Visit: Payer: BC Managed Care – PPO | Admitting: Physical Therapy

## 2020-02-01 DIAGNOSIS — M25672 Stiffness of left ankle, not elsewhere classified: Secondary | ICD-10-CM

## 2020-02-01 DIAGNOSIS — R2689 Other abnormalities of gait and mobility: Secondary | ICD-10-CM | POA: Diagnosis not present

## 2020-02-01 DIAGNOSIS — M25572 Pain in left ankle and joints of left foot: Secondary | ICD-10-CM | POA: Diagnosis not present

## 2020-02-02 ENCOUNTER — Emergency Department (HOSPITAL_COMMUNITY): Payer: BC Managed Care – PPO

## 2020-02-02 ENCOUNTER — Encounter (HOSPITAL_COMMUNITY): Payer: Self-pay

## 2020-02-02 ENCOUNTER — Encounter: Payer: Self-pay | Admitting: Family Medicine

## 2020-02-02 ENCOUNTER — Encounter: Payer: Self-pay | Admitting: Physical Therapy

## 2020-02-02 ENCOUNTER — Other Ambulatory Visit: Payer: Self-pay

## 2020-02-02 ENCOUNTER — Emergency Department (HOSPITAL_COMMUNITY)
Admission: EM | Admit: 2020-02-02 | Discharge: 2020-02-02 | Disposition: A | Discharge status: Home / Self Care | Payer: BC Managed Care – PPO | Attending: Emergency Medicine | Admitting: Emergency Medicine

## 2020-02-02 DIAGNOSIS — Z87891 Personal history of nicotine dependence: Secondary | ICD-10-CM | POA: Insufficient documentation

## 2020-02-02 DIAGNOSIS — R200 Anesthesia of skin: Secondary | ICD-10-CM

## 2020-02-02 LAB — COMPREHENSIVE METABOLIC PANEL
ALT: 16 U/L (ref 0–44)
AST: 25 U/L (ref 15–41)
Albumin: 3.5 g/dL (ref 3.5–5.0)
Alkaline Phosphatase: 61 U/L (ref 38–126)
Anion gap: 9 (ref 5–15)
BUN: 10 mg/dL (ref 6–20)
CO2: 23 mmol/L (ref 22–32)
Calcium: 8.8 mg/dL — ABNORMAL LOW (ref 8.9–10.3)
Chloride: 107 mmol/L (ref 98–111)
Creatinine, Ser: 0.66 mg/dL (ref 0.44–1.00)
GFR calc Af Amer: >60 mL/min (ref >60)
GFR calc non Af Amer: >60 mL/min (ref >60)
Glucose, Bld: 89 mg/dL (ref 70–99)
Potassium: 3.8 mmol/L (ref 3.5–5.1)
Sodium: 139 mmol/L (ref 135–145)
Total Bilirubin: 0.4 mg/dL (ref 0.3–1.2)
Total Protein: 7.3 g/dL (ref 6.5–8.1)

## 2020-02-02 LAB — CBC
HCT: 38.9 % (ref 36.0–46.0)
Hemoglobin: 13.1 g/dL (ref 12.0–15.0)
MCH: 29.2 pg (ref 26.0–34.0)
MCHC: 33.7 g/dL (ref 30.0–36.0)
MCV: 86.6 fL (ref 80.0–100.0)
Platelets: 266 10*3/uL (ref 150–400)
RBC: 4.49 MIL/uL (ref 3.87–5.11)
RDW: 12.8 % (ref 11.5–15.5)
WBC: 5.9 10*3/uL (ref 4.0–10.5)
nRBC: 0 % (ref 0.0–0.2)

## 2020-02-02 LAB — I-STAT BETA HCG BLOOD, ED (MC, WL, AP ONLY): I-stat hCG, quantitative: <5 m[IU]/mL (ref <5)

## 2020-02-02 MED ORDER — DIPHENHYDRAMINE HCL 50 MG/ML IJ SOLN
50.0000 mg | Freq: Once | INTRAMUSCULAR | Status: AC
  Administered 2020-02-02: 50 mg via INTRAVENOUS
  Filled 2020-02-02: qty 1

## 2020-02-02 MED ORDER — IOHEXOL 350 MG/ML SOLN
80.0000 mL | Freq: Once | INTRAVENOUS | Status: AC | PRN
  Administered 2020-02-02: 80 mL via INTRAVENOUS

## 2020-02-02 MED ORDER — SODIUM CHLORIDE (PF) 0.9 % IJ SOLN
INTRAMUSCULAR | Status: AC
  Administered 2020-02-02: 3 mL
  Filled 2020-02-02: qty 50

## 2020-02-02 NOTE — ED Notes (Signed)
Pt started to have itching in eyes and face during the CT. Benadryl given

## 2020-02-02 NOTE — Discharge Instructions (Addendum)
You were evaluated in the Emergency Department and after careful evaluation, we did not find any emergent condition requiring admission or further testing in the hospital.  Your exam/testing today was overall reassuring.  Your CT scans did not show any emergencies.  We recommend follow-up with the neurologist for further management and/or testing.  Please return to the Emergency Department if you experience any worsening of your condition.  We encourage you to follow up with a primary care provider.  Thank you for allowing [place] to be a part of your care.

## 2020-02-02 NOTE — Therapy (Signed)
[facility] [address] [place], [place], [address] Phone: [phone]   Fax:  [phone]  Physical Therapy Treatment  Patient Details  Name: Patricia Morse MRN: [id number] Date of Birth: Jun 28, 1986 Referring Provider (PT): Terrilee Files   Encounter Date: 02/01/2020  PT End of Session - 02/02/20 1403    Visit Number  3    Number of Visits  12    Date for PT Re-Evaluation  03/03/20    Authorization Type  BCBS  state health    PT Start Time  1524    PT Stop Time  1600    PT Time Calculation (min)  36 min    Activity Tolerance  Patient tolerated treatment well    Behavior During Therapy  [facility] for tasks assessed/performed       Past Medical History:  Diagnosis Date  . Allergy   . Auditory neuropathy BILATERAL  . Bilateral sensorineural hearing loss    SECONDARY AUDITORY NEUROPATHY  . 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 Depression   . 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 Internal derangement of right knee   . PONV (postoperative nausea and vomiting) POSTOP COCHLEAR  IMPLANT 2006  . Seasonal allergies     Past Surgical History:  Procedure Laterality Date  . BILATERAL MIDDLE EAR EXPLORATION/ SEALING OF OVAL AND ROUND WINDOW FISTULAS  1999 (AGE 26)   PERILYMPH FISTULAS  . CHONDROPLASTY  03/13/2012   Procedure: CHONDROPLASTY;  Surgeon: Shelda Pal, MD;  Location: [facility];  Service: Orthopedics;  Laterality: Right;  . COCHLEAR IMPLANT  11-15-2005  (AGE 73)   LEFT EAR  (CLARION HIGH-RESOLUTION 90K MULTICHANNEL,  1-J ELECTRODE)  . COCHLEAR IMPLANT Right 11/12/2018   Procedure: COCHLEAR IMPLANT RIGHT EAR;  Surgeon: Ermalinda Barrios, MD;  Location: [facility];  Service: ENT;  Laterality: Right;  . KNEE ARTHROSCOPY  03/13/2012   Procedure: ARTHROSCOPY KNEE;  Surgeon: Shelda Pal, MD;  Location: [facility];  Service: Orthopedics;   Laterality: Right;  RIGHT KNEE DIAGNOSTIC AND OPERATIVE SCOPE  . WISDOM TOOTH EXTRACTION  AGE 23   DENTAL OFFICE    There were no vitals filed for this visit.  Subjective Assessment - 02/02/20 1240    Subjective  Pt states pain in heel is improving. She switched shoes back to old [employer]. Was able to go for walk yesterday without pain.    Currently in Pain?  Yes    Pain Score  1     Pain Location  Heel    Pain Orientation  Left    Pain Descriptors / Indicators  Aching    Pain Type  Acute pain    Pain Onset  More than a month ago    Pain Frequency  Intermittent                       [facility] Adult PT Treatment/Exercise - 02/01/20 1534      Exercises   Exercises  Ankle      Modalities   Modalities  Iontophoresis      Iontophoresis   Type of Iontophoresis  Dexamethasone    Location  L heel    Time  4 hr patch      Manual Therapy   Manual Therapy  Soft tissue mobilization;Joint mobilization    Manual therapy comments  PROM for ankle  DF     Soft tissue mobilization  DTM and IASTM to, heel and pl fascia, and distal achilles, gastroc       Ankle Exercises: Stretches   Soleus Stretch  --    Gastroc Stretch  2 reps;30 seconds    Other Stretch  --      Ankle Exercises: Aerobic   Stationary Bike  L 1 x 6  min;       Ankle Exercises: Standing   Heel Raises  20 reps    Heel Raises Limitations  with ball squeeze    Other Standing Ankle Exercises  Tandem stance 30 sec x2 bil;     Other Standing Ankle Exercises  March x20 (for SLS/stability)                PT Short Term Goals - 01/25/20 0826      PT SHORT TERM GOAL #1   Title  Pt to be independent with initial HEP    Time  2    Status  New    Target Date  02/04/20      PT SHORT TERM GOAL #2   Title  Pt to report decreased pain in L foot, to 4/10 with walking    Time  3    Period  Weeks    Status  New    Target Date  02/11/20        PT Long Term Goals - 01/25/20 0827      PT LONG TERM GOAL #1    Title  Pt to be independent with final HEP    Time  6    Period  Weeks    Status  New    Target Date  03/03/20      PT LONG TERM GOAL #2   Title  Pt to report decreased pain in L foot to 0-2/10 with standing and walking activity    Time  6    Period  Weeks    Status  New    Target Date  03/03/20      PT LONG TERM GOAL #3   Title  Pt to demo improved DF by at least 3 deg bilaterally , to improve gait and pain    Time  6    Period  Weeks    Status  New    Target Date  03/03/20      PT LONG TERM GOAL #4   Title  Pt to demo improved ability for SLS, up to 30 sec bilaterally, for improved stability    Time  6    Period  Weeks    Status  New    Target Date  03/03/20      PT LONG TERM GOAL #5   Title  Pt to be compliant with footwear and/or orthotics, appropriate for her foot type.    Time  6    Period  Weeks    Status  New    Target Date  03/03/20            Plan - 02/02/20 1404    Clinical Impression Statement  Pt wtih decreasing soreness at base of heel with manual and palpation today. Ther ex continued for stretching and strengthening. Discussed footwear recommendations and foot posture.    Personal Factors and Comorbidities  Time since onset of injury/illness/exacerbation    Examination-Activity Limitations  Locomotion Level;Squat;Stairs;Stand    Examination-Participation Restrictions  Meal Prep;Cleaning;Community Activity;Driving;Shop;Laundry    Stability/Clinical Decision Making  Stable/Uncomplicated  Rehab Potential  Good    PT Frequency  2x / week    PT Duration  6 weeks    PT Treatment/Interventions  ADLs/Self Care Home Management;Electrical Stimulation;Cryotherapy;Gait training;DME Instruction;Ultrasound;Moist Heat;Iontophoresis 4mg /ml Dexamethasone;Stair training;Functional mobility training;Therapeutic activities;Therapeutic exercise;Balance training;Neuromuscular re-education;Manual techniques;Orthotic Fit/Training;Patient/family education;Passive range  of motion;Dry needling;Taping;Joint Manipulations;Spinal Manipulations;Vasopneumatic Device    Consulted and Agree with Plan of Care  Patient       Patient will benefit from skilled therapeutic intervention in order to improve the following deficits and impairments:  Abnormal gait, Decreased coordination, Decreased range of motion, Decreased endurance, Decreased activity tolerance, Pain, Hypomobility, Impaired flexibility, Improper body mechanics, Decreased balance, Decreased mobility, Decreased strength  Visit Diagnosis: Pain in left ankle and joints of left foot  Stiffness of left ankle, not elsewhere classified  Other abnormalities of gait and mobility     Problem List Patient Active Problem List   Diagnosis Date Noted  . Plantar fasciitis of left foot 09/21/2019  . Postoperative observation 11/12/2018  . Visual disturbance 04/25/2016  . Dizziness 04/25/2016  . Obesity, unspecified 06/08/2013  . Depression 10/10/2012  . Hearing loss 10/10/2012    10/12/2012, PT, DPT 2:08 PM  02/02/20    [facility] [address] [place], [place], [place] Phone: [phone]   Fax:  [phone]  Name: Patricia Morse MRN: Verlon Setting Date of Birth: Jan 21, 1986

## 2020-02-02 NOTE — ED Triage Notes (Addendum)
Patient states she was lying on the couch last night and her right leg went numb and she moved, stood up trying to get blood flowing. Patient states she began having arm numbness on the right approx 1 hour later.   Today, the patient states she is still experiencing right arm numbness, but the right leg is better.

## 2020-02-02 NOTE — ED Notes (Signed)
Spoke with CT, pt will be next to have test completed

## 2020-02-02 NOTE — ED Notes (Signed)
States the Benadryl has helped with her itching. Continue to wait for EDP to speak with pt regarding CT results and POC

## 2020-02-02 NOTE — ED Provider Notes (Signed)
[facility] Emergency Department Provider Note MRN:  [id number]  Arrival date & time: 02/02/20     Chief Complaint   Numbness History of Present Illness   Patricia Morse is a 34 y.o. year-old female with a history of cochlear implant presenting to the ED with chief complaint of numbness.  Patient is endorsing numbness to the right arm.  She explains that she began with some numbness to the right leg yesterday morning, which almost completely resolved, still slightly decreased sensation.  Yesterday afternoon she began experiencing numbness to the right arm as well.  Denies pain, no weakness, no headache, no vision change, no chest pain, no shortness of breath, no abdominal pain.  Review of Systems  A complete 10 system review of systems was obtained and all systems are negative except as noted in the HPI and PMH.   Patient's Health History    Past Medical History:  Diagnosis Date  . Allergy   . Auditory neuropathy BILATERAL  . Bilateral sensorineural hearing loss    SECONDARY AUDITORY NEUROPATHY  . 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 Depression   . 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 Internal derangement of right knee   . PONV (postoperative nausea and vomiting) POSTOP COCHLEAR  IMPLANT 2006  . Seasonal allergies     Past Surgical History:  Procedure Laterality Date  . BILATERAL MIDDLE EAR EXPLORATION/ SEALING OF OVAL AND ROUND WINDOW FISTULAS  1999 (AGE 55)   PERILYMPH FISTULAS  . CHONDROPLASTY  03/13/2012   Procedure: CHONDROPLASTY;  Surgeon: Mauri Pole, MD;  Location: [facility];  Service: Orthopedics;  Laterality: Right;  . COCHLEAR IMPLANT  11-15-2005  (AGE 3)   LEFT EAR  (CLARION HIGH-RESOLUTION 90K MULTICHANNEL,  1-J ELECTRODE)  . COCHLEAR IMPLANT Right 11/12/2018   Procedure: COCHLEAR IMPLANT RIGHT EAR;  Surgeon: Vicie Mutters, MD;  Location: [facility];  Service: ENT;  Laterality: Right;  . KNEE ARTHROSCOPY  03/13/2012   Procedure: ARTHROSCOPY KNEE;  Surgeon: Mauri Pole, MD;  Location: [facility];  Service: Orthopedics;  Laterality: Right;  RIGHT KNEE DIAGNOSTIC AND OPERATIVE SCOPE  . WISDOM TOOTH EXTRACTION  AGE 15   DENTAL OFFICE    Family History  Problem Relation Age of Onset  . Depression Mother   . Hypertension Mother   . Mental illness Mother   . Anxiety disorder Maternal Grandmother     Social History   Socioeconomic History  . Marital status: Single    Spouse name: Not on file  . Number of children: 0  . Years of education: Not on file  . Highest education level: Not on file  Occupational History  . Occupation: Patent examiner for [employer]    Employer: [employer]  Tobacco Use  . Smoking status: Former Smoker    Types: Cigarettes    Quit date: 03/10/2008    Years since quitting: 11.9  . Smokeless tobacco: Never Used  . Tobacco comment: SOCIAL SMOKER IN COLLEGE FOR 92YRS -- NO SMOKING SINCE  Substance and Sexual Activity  . Alcohol use: Yes    Alcohol/week: 3.0 - 5.0 standard drinks    Types: 3 - 5 Glasses of wine per week    Comment: OCCASIONAL, "social"   . Drug use: No  . Sexual activity: Yes    Partners: Male    Birth control/protection: Pill  Other Topics Concern  . Not on file  Social History Narrative   At 78 months of age, pt had encephalitis vs. Guillain-Barre Syndrome (MD's unsure, therefore, doesn't take flu shot)   Social Determinants of Health   Financial Resource Strain:   . Difficulty of Paying Living Expenses: Not on file  Food Insecurity:   . Worried About Programme researcher, broadcasting/film/video in the Last Year: Not on file  . Ran Out of Food in the Last Year: Not on file  Transportation Needs:   . Lack of Transportation (Medical): Not on file  . Lack of Transportation (Non-Medical): Not on file  Physical Activity:   . Days of Exercise per Week: Not on file  . Minutes  of Exercise per Session: Not on file  Stress:   . Feeling of Stress : Not on file  Social Connections:   . Frequency of Communication with Friends and Family: Not on file  . Frequency of Social Gatherings with Friends and Family: Not on file  . Attends Religious Services: Not on file  . Active Member of Clubs or Organizations: Not on file  . Attends Banker Meetings: Not on file  . Marital Status: Not on file  Intimate Partner Violence:   . Fear of Current or Ex-Partner: Not on file  . Emotionally Abused: Not on file  . Physically Abused: Not on file  . Sexually Abused: Not on file     Physical Exam   Vitals:   02/02/20 1355  BP: (!) 131/93  Pulse: 99  Resp: 16  Temp: 98.1 F (36.7 C)  SpO2: 97%    CONSTITUTIONAL: Well-appearing, NAD NEURO:  Alert and oriented x 3, subjective decreased sensation to the right arm and right leg, more pronounced with the right arm.  Otherwise no facial droop, normal and symmetric strength, normal speech, normal extraocular movements, normal coordination, normal gait EYES:  eyes equal and reactive ENT/NECK:  no LAD, no JVD CARDIO: Regular rate, well-perfused, normal S1 and S2 PULM:  CTAB no wheezing or rhonchi GI/GU:  normal bowel sounds, non-distended, non-tender MSK/SPINE:  No gross deformities, no edema SKIN:  no rash, atraumatic PSYCH:  Appropriate speech and behavior  *Additional and/or pertinent findings included in MDM below  Diagnostic and Interventional Summary    EKG Interpretation  Date/Time:    Ventricular Rate:    PR Interval:    QRS Duration:   QT Interval:    QTC Calculation:   R Axis:     Text Interpretation:        Cardiac Monitoring Interpretation:  Labs Reviewed  COMPREHENSIVE METABOLIC PANEL - Abnormal; Notable for the following components:      Result Value   Calcium 8.8 (*)    All other components within normal limits  CBC  I-STAT BETA HCG BLOOD, ED ([facility], [facility], [facility] ONLY)    CT Angio Head W  or Wo Contrast  Final Result    CT Angio Neck W and/or Wo Contrast  Final Result      Medications  sodium chloride (PF) 0.9 % injection (3 mLs  Given 02/02/20 1824)  iohexol (OMNIPAQUE) 350 MG/ML injection 80 mL (80 mLs Intravenous Contrast Given 02/02/20 1748)  diphenhydrAMINE (BENADRYL) injection 50 mg (50 mg Intravenous Given 02/02/20 1824)     Procedures  /  Critical Care Procedures  ED Course and Medical Decision Making  I have reviewed the triage vital signs, the nursing notes, and pertinent available records from the EMR.  Pertinent  labs & imaging results that were available during my care of the patient were reviewed by me and considered in my medical decision making (see below for details).     Greater than 24 hours of right-sided decreased sensation, little to no risk factors for ischemic event, considering MS.  Patient has a cochlear implant that is not MRI compatible, discussed with neurology and with regard to imaging options.  Will obtain CTA head and neck to ensure no vascular abnormality.  7:30 PM update: CT imaging is unremarkable.  Patient's symptoms are still present, possibly mildly improved.  Of note patient did have some mild symptoms of sneezing and itchy eyes after contrast today.  Improved with Benadryl.  Without ability to obtain MRI today, patient is appropriate for neurology follow-up as an outpatient.  Strict return precautions for worsening neurological deficits.  Elmer Sow. Pilar Plate, MD [facility] Emergency Medicine [facility] [email or web address]  Final Clinical Impressions(s) / ED Diagnoses     ICD-10-CM   1. Numbness  R20.0     ED Discharge Orders         Ordered    Ambulatory referral to Neurology    Comments: An appointment is requested in approximately: 2 weeks   02/02/20 1929           Discharge Instructions Discussed with and Provided to Patient:     Discharge Instructions     You were evaluated in the Emergency  Department and after careful evaluation, we did not find any emergent condition requiring admission or further testing in the hospital.  Your exam/testing today was overall reassuring.  Your CT scans did not show any emergencies.  We recommend follow-up with the neurologist for further management and/or testing.  Please return to the Emergency Department if you experience any worsening of your condition.  We encourage you to follow up with a primary care provider.  Thank you for allowing [place] to be a part of your care.        Sabas Sous, MD 02/02/20 [phone]

## 2020-02-03 ENCOUNTER — Ambulatory Visit: Payer: BC Managed Care – PPO | Admitting: Physical Therapy

## 2020-02-03 ENCOUNTER — Encounter: Payer: Self-pay | Admitting: Physical Therapy

## 2020-02-03 DIAGNOSIS — M25572 Pain in left ankle and joints of left foot: Secondary | ICD-10-CM | POA: Diagnosis not present

## 2020-02-03 DIAGNOSIS — R2689 Other abnormalities of gait and mobility: Secondary | ICD-10-CM | POA: Diagnosis not present

## 2020-02-03 DIAGNOSIS — M25672 Stiffness of left ankle, not elsewhere classified: Secondary | ICD-10-CM | POA: Diagnosis not present

## 2020-02-03 NOTE — Therapy (Signed)
[employer] [facility] [address] [place], [place], [address] Phone: [phone]   Fax:  [phone]  Physical Therapy Treatment  Patient Details  Name: Patricia Morse MRN: [id number] Date of Birth: June 29, 1986 Referring Provider (PT): Charlann Boxer   Encounter Date: 02/03/2020  PT End of Session - 02/03/20 1532    Visit Number  4    Number of Visits  12    Date for PT Re-Evaluation  03/03/20    Authorization Type  BCBS  state health    PT Start Time  1520    PT Stop Time  1558    PT Time Calculation (min)  38 min    Activity Tolerance  Patient tolerated treatment well    Behavior During Therapy  [facility] for tasks assessed/performed       Past Medical History:  Diagnosis Date  . Allergy   . Auditory neuropathy BILATERAL  . Bilateral sensorineural hearing loss    SECONDARY AUDITORY NEUROPATHY  . 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 Depression   . 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 Internal derangement of right knee   . PONV (postoperative nausea and vomiting) POSTOP COCHLEAR  IMPLANT 2006  . Seasonal allergies     Past Surgical History:  Procedure Laterality Date  . BILATERAL MIDDLE EAR EXPLORATION/ SEALING OF OVAL AND ROUND WINDOW FISTULAS  1999 (AGE 42)   PERILYMPH FISTULAS  . CHONDROPLASTY  03/13/2012   Procedure: CHONDROPLASTY;  Surgeon: Mauri Pole, MD;  Location: [facility];  Service: Orthopedics;  Laterality: Right;  . COCHLEAR IMPLANT  11-15-2005  (AGE 75)   LEFT EAR  (CLARION HIGH-RESOLUTION 90K MULTICHANNEL,  1-J ELECTRODE)  . COCHLEAR IMPLANT Right 11/12/2018   Procedure: COCHLEAR IMPLANT RIGHT EAR;  Surgeon: Vicie Mutters, MD;  Location: [place];  Service: ENT;  Laterality: Right;  . KNEE ARTHROSCOPY  03/13/2012   Procedure: ARTHROSCOPY KNEE;  Surgeon: Mauri Pole, MD;  Location[facility];  Service: Orthopedics;   Laterality: Right;  RIGHT KNEE DIAGNOSTIC AND OPERATIVE SCOPE  . WISDOM TOOTH EXTRACTION  AGE 44   DENTAL OFFICE    There were no vitals filed for this visit.  Subjective Assessment - 02/03/20 1532    Subjective  Pt states improvig heel pain. Will attempt to obtain new shoes in next few days. Was in ER yesterday, with UE numbness, thinks she is having reaction to Covid vaccine, CT scan clear, no other sympotms today.    Currently in Pain?  No/denies    Pain Score  0-No pain                       [facility] Adult PT Treatment/Exercise - 02/03/20 0001      Exercises   Exercises  Ankle      Modalities   Modalities  --      Iontophoresis   Type of Iontophoresis  --    Location  --    Time  --      Manual Therapy   Manual Therapy  Soft tissue mobilization;Joint mobilization    Manual therapy comments  PROM for ankle DF     Joint Mobilization  Post STJ glides for DF, CTJ mobs for INv/Ev.     Soft tissue mobilization  DTM and IASTM to, heel and pl fascia,  Ankle Exercises: Stretches   Soleus Stretch  2 reps;20 seconds    Gastroc Stretch  2 reps;30 seconds    Other Stretch  self Pl Fas stretch x2 min;       Ankle Exercises: Aerobic   Stationary Bike  --      Ankle Exercises: Standing   Heel Raises  20 reps    Heel Raises Limitations  with ball squeeze    Other Standing Ankle Exercises  Tandem stance 30 sec x2 bil;     Other Standing Ankle Exercises  March x20 (for SLS/stability) ; March/Walk 10 ft x4       Ankle Exercises: Supine   T-Band  Toe flexion, Inv and Ev, all x20 RTB                PT Short Term Goals - 01/25/20 0826      PT SHORT TERM GOAL #1   Title  Pt to be independent with initial HEP    Time  2    Status  New    Target Date  02/04/20      PT SHORT TERM GOAL #2   Title  Pt to report decreased pain in L foot, to 4/10 with walking    Time  3    Period  Weeks    Status  New    Target Date  02/11/20        PT Long Term  Goals - 01/25/20 0827      PT LONG TERM GOAL #1   Title  Pt to be independent with final HEP    Time  6    Period  Weeks    Status  New    Target Date  03/03/20      PT LONG TERM GOAL #2   Title  Pt to report decreased pain in L foot to 0-2/10 with standing and walking activity    Time  6    Period  Weeks    Status  New    Target Date  03/03/20      PT LONG TERM GOAL #3   Title  Pt to demo improved DF by at least 3 deg bilaterally , to improve gait and pain    Time  6    Period  Weeks    Status  New    Target Date  03/03/20      PT LONG TERM GOAL #4   Title  Pt to demo improved ability for SLS, up to 30 sec bilaterally, for improved stability    Time  6    Period  Weeks    Status  New    Target Date  03/03/20      PT LONG TERM GOAL #5   Title  Pt to be compliant with footwear and/or orthotics, appropriate for her foot type.    Time  6    Period  Weeks    Status  New    Target Date  03/03/20            Plan - 02/03/20 1602    Clinical Impression Statement  Pt with minimal/no soreness with ther ex or manual today. Will follow up with pt in 1 week, after she obtains new shoes, if she is still pain free, will progress to d/c.    Personal Factors and Comorbidities  Time since onset of injury/illness/exacerbation    Examination-Activity Limitations  Locomotion Level;Squat;Stairs;Stand    Examination-Participation Restrictions  Meal Prep;Cleaning;Community Activity;Driving;Shop;Laundry  Stability/Clinical Decision Making  Stable/Uncomplicated    Rehab Potential  Good    PT Frequency  2x / week    PT Duration  6 weeks    PT Treatment/Interventions  ADLs/Self Care Home Management;Electrical Stimulation;Cryotherapy;Gait training;DME Instruction;Ultrasound;Moist Heat;Iontophoresis 4mg /ml Dexamethasone;Stair training;Functional mobility training;Therapeutic activities;Therapeutic exercise;Balance training;Neuromuscular re-education;Manual techniques;Orthotic  Fit/Training;Patient/family education;Passive range of motion;Dry needling;Taping;Joint Manipulations;Spinal Manipulations;Vasopneumatic Device    Consulted and Agree with Plan of Care  Patient       Patient will benefit from skilled therapeutic intervention in order to improve the following deficits and impairments:  Abnormal gait, Decreased coordination, Decreased range of motion, Decreased endurance, Decreased activity tolerance, Pain, Hypomobility, Impaired flexibility, Improper body mechanics, Decreased balance, Decreased mobility, Decreased strength  Visit Diagnosis: Pain in left ankle and joints of left foot  Stiffness of left ankle, not elsewhere classified  Other abnormalities of gait and mobility     Problem List Patient Active Problem List   Diagnosis Date Noted  . Plantar fasciitis of left foot 09/21/2019  . Postoperative observation 11/12/2018  . Visual disturbance 04/25/2016  . Dizziness 04/25/2016  . Obesity, unspecified 06/08/2013  . Depression 10/10/2012  . Hearing loss 10/10/2012     10/12/2012, PT, DPT 4:06 PM  02/03/20   [facility] [address] [place], [place], [place] Phone: [phone]   Fax:  [phone]  Name: YVETT ROSSEL MRN: [facility] Date of Birth: May 08, 1986

## 2020-02-04 ENCOUNTER — Other Ambulatory Visit: Payer: Self-pay | Admitting: Family Medicine

## 2020-02-04 DIAGNOSIS — H1013 Acute atopic conjunctivitis, bilateral: Secondary | ICD-10-CM

## 2020-02-08 ENCOUNTER — Encounter: Payer: BC Managed Care – PPO | Admitting: Physical Therapy

## 2020-02-09 ENCOUNTER — Ambulatory Visit (INDEPENDENT_AMBULATORY_CARE_PROVIDER_SITE_OTHER): Payer: BC Managed Care – PPO | Admitting: Licensed Clinical Social Worker

## 2020-02-09 DIAGNOSIS — F3341 Major depressive disorder, recurrent, in partial remission: Secondary | ICD-10-CM | POA: Diagnosis not present

## 2020-02-10 ENCOUNTER — Ambulatory Visit: Payer: BC Managed Care – PPO | Admitting: Physical Therapy

## 2020-02-10 ENCOUNTER — Other Ambulatory Visit: Payer: Self-pay

## 2020-02-10 ENCOUNTER — Encounter: Payer: Self-pay | Admitting: Physical Therapy

## 2020-02-10 DIAGNOSIS — M25672 Stiffness of left ankle, not elsewhere classified: Secondary | ICD-10-CM

## 2020-02-10 DIAGNOSIS — M25572 Pain in left ankle and joints of left foot: Secondary | ICD-10-CM | POA: Diagnosis not present

## 2020-02-10 DIAGNOSIS — R2689 Other abnormalities of gait and mobility: Secondary | ICD-10-CM

## 2020-02-12 ENCOUNTER — Encounter: Payer: Self-pay | Admitting: Physical Therapy

## 2020-02-12 NOTE — Therapy (Signed)
[facility] [address] [place], [place], [address] Phone: [phone]   Fax:  [phone]  Physical Therapy Treatment  Patient Details  Name: Patricia Morse MRN: [id number] Date of Birth: 1986-09-27 Referring Provider (PT): Terrilee Files   Encounter Date: 02/10/2020  PT End of Session - 02/12/20 1446    Visit Number  5    Number of Visits  12    Date for PT Re-Evaluation  03/03/20    Authorization Type  BCBS  state health    PT Start Time  1518    PT Stop Time  1559    PT Time Calculation (min)  41 min    Activity Tolerance  Patient tolerated treatment well    Behavior During Therapy  [facility] for tasks assessed/performed       Past Medical History:  Diagnosis Date  . Allergy   . Auditory neuropathy BILATERAL  . Bilateral sensorineural hearing loss    SECONDARY AUDITORY NEUROPATHY  . 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 Depression   . 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 Internal derangement of right knee   . PONV (postoperative nausea and vomiting) POSTOP COCHLEAR  IMPLANT 2006  . Seasonal allergies     Past Surgical History:  Procedure Laterality Date  . BILATERAL MIDDLE EAR EXPLORATION/ SEALING OF OVAL AND ROUND WINDOW FISTULAS  1999 (AGE 24)   PERILYMPH FISTULAS  . CHONDROPLASTY  03/13/2012   Procedure: CHONDROPLASTY;  Surgeon: Shelda Pal, MD;  Location: [facility];  Service: Orthopedics;  Laterality: Right;  . COCHLEAR IMPLANT  11-15-2005  (AGE 63)   LEFT EAR  (CLARION HIGH-RESOLUTION 90K MULTICHANNEL,  1-J ELECTRODE)  . COCHLEAR IMPLANT Right 11/12/2018   Procedure: COCHLEAR IMPLANT RIGHT EAR;  Surgeon: Ermalinda Barrios, MD;  Location: [facility];  Service: ENT;  Laterality: Right;  . KNEE ARTHROSCOPY  03/13/2012   Procedure: ARTHROSCOPY KNEE;  Surgeon: Shelda Pal, MD;  Location: [facility];  Service: Orthopedics;   Laterality: Right;  RIGHT KNEE DIAGNOSTIC AND OPERATIVE SCOPE  . WISDOM TOOTH EXTRACTION  AGE 78   DENTAL OFFICE    There were no vitals filed for this visit.  Subjective Assessment - 02/12/20 1445    Subjective  Pt obtained new shoes, good fit and comfort. Pt states much less heel pain in last week. UE numbness from last week is now resolved.    Currently in Pain?  No/denies    Pain Score  0-No pain    Pain Onset  More than a month ago                       [facility] Adult PT Treatment/Exercise - 02/12/20 1441      Exercises   Exercises  Ankle      Manual Therapy   Manual Therapy  Soft tissue mobilization;Joint mobilization    Manual therapy comments  PROM for ankle DF     Joint Mobilization  Post STJ glides for DF,     Soft tissue mobilization  DTM and IASTM to, heel and pl fascia,       Ankle Exercises: Stretches   Soleus Stretch  2 reps;20 seconds    Gastroc Stretch  2 reps;30 seconds    Other Stretch  --      Ankle Exercises: Aerobic   Recumbent Bike  L1 x 8 min;       Ankle Exercises: Standing   Heel Raises  20 reps    Other Standing Ankle Exercises  Tandem stance 30 sec x2 bil;     Other Standing Ankle Exercises  March x20 (for SLS/stability) ; March/Walk 10 ft x4       Ankle Exercises: Supine   T-Band  Toe flexion, Ankle 4 way, all x20 RTB              PT Education - 02/12/20 1446    Education Details  HEP reviewed    Person(s) Educated  Patient    Methods  Explanation;Demonstration;Verbal cues    Comprehension  Verbalized understanding;Returned demonstration;Verbal cues required;Tactile cues required       PT Short Term Goals - 01/25/20 0826      PT SHORT TERM GOAL #1   Title  Pt to be independent with initial HEP    Time  2    Status  New    Target Date  02/04/20      PT SHORT TERM GOAL #2   Title  Pt to report decreased pain in L foot, to 4/10 with walking    Time  3    Period  Weeks    Status  New    Target Date  02/11/20         PT Long Term Goals - 01/25/20 0827      PT LONG TERM GOAL #1   Title  Pt to be independent with final HEP    Time  6    Period  Weeks    Status  New    Target Date  03/03/20      PT LONG TERM GOAL #2   Title  Pt to report decreased pain in L foot to 0-2/10 with standing and walking activity    Time  6    Period  Weeks    Status  New    Target Date  03/03/20      PT LONG TERM GOAL #3   Title  Pt to demo improved DF by at least 3 deg bilaterally , to improve gait and pain    Time  6    Period  Weeks    Status  New    Target Date  03/03/20      PT LONG TERM GOAL #4   Title  Pt to demo improved ability for SLS, up to 30 sec bilaterally, for improved stability    Time  6    Period  Weeks    Status  New    Target Date  03/03/20      PT LONG TERM GOAL #5   Title  Pt to be compliant with footwear and/or orthotics, appropriate for her foot type.    Time  6    Period  Weeks    Status  New    Target Date  03/03/20            Plan - 02/12/20 1447    Clinical Impression Statement  Pt progressing well, with much less pain in heel with palpation today, as well as with activity and weight bearing activity. Good fit with new shoes, improved alignement. Pt with tendency for supination R>L. Pt to benefit from continued care for 1-2 more visits.    Personal Factors and Comorbidities  Time since onset of injury/illness/exacerbation    Examination-Activity Limitations  Locomotion Level;Squat;Stairs;Stand    Examination-Participation Restrictions  Meal  Prep;Cleaning;Community Activity;Driving;Shop;Laundry    Stability/Clinical Decision Making  Stable/Uncomplicated    Rehab Potential  Good    PT Frequency  2x / week    PT Duration  6 weeks    PT Treatment/Interventions  ADLs/Self Care Home Management;Electrical Stimulation;Cryotherapy;Gait training;DME Instruction;Ultrasound;Moist Heat;Iontophoresis 4mg /ml Dexamethasone;Stair training;Functional mobility training;Therapeutic  activities;Therapeutic exercise;Balance training;Neuromuscular re-education;Manual techniques;Orthotic Fit/Training;Patient/family education;Passive range of motion;Dry needling;Taping;Joint Manipulations;Spinal Manipulations;Vasopneumatic Device    Consulted and Agree with Plan of Care  Patient       Patient will benefit from skilled therapeutic intervention in order to improve the following deficits and impairments:  Abnormal gait, Decreased coordination, Decreased range of motion, Decreased endurance, Decreased activity tolerance, Pain, Hypomobility, Impaired flexibility, Improper body mechanics, Decreased balance, Decreased mobility, Decreased strength  Visit Diagnosis: Pain in left ankle and joints of left foot  Stiffness of left ankle, not elsewhere classified  Other abnormalities of gait and mobility     Problem List Patient Active Problem List   Diagnosis Date Noted  . Plantar fasciitis of left foot 09/21/2019  . Postoperative observation 11/12/2018  . Visual disturbance 04/25/2016  . Dizziness 04/25/2016  . Obesity, unspecified 06/08/2013  . Depression 10/10/2012  . Hearing loss 10/10/2012     Lyndee Hensen, PT, DPT 2:54 PM  02/12/20   [place], [place], [address] Phone: [phone]   Fax:  [phone]  Name: Patricia Morse MRN: [id number] Date of Birth: 06/23/1986

## 2020-02-14 ENCOUNTER — Encounter: Payer: Self-pay | Admitting: Family Medicine

## 2020-02-16 ENCOUNTER — Encounter: Payer: BC Managed Care – PPO | Admitting: Physical Therapy

## 2020-02-18 ENCOUNTER — Ambulatory Visit: Payer: BC Managed Care – PPO | Admitting: Physical Therapy

## 2020-02-18 ENCOUNTER — Encounter: Payer: Self-pay | Admitting: Physical Therapy

## 2020-02-18 ENCOUNTER — Other Ambulatory Visit: Payer: Self-pay

## 2020-02-18 DIAGNOSIS — M25672 Stiffness of left ankle, not elsewhere classified: Secondary | ICD-10-CM | POA: Diagnosis not present

## 2020-02-18 DIAGNOSIS — R2689 Other abnormalities of gait and mobility: Secondary | ICD-10-CM | POA: Diagnosis not present

## 2020-02-18 DIAGNOSIS — M25572 Pain in left ankle and joints of left foot: Secondary | ICD-10-CM

## 2020-02-18 NOTE — Therapy (Signed)
[facility] [address] [place], [place], [address] Phone: [phone]   Fax:  [phone]  Physical Therapy Treatment/DIscharge   Patient Details  Name: Patricia Morse MRN: [id number] Date of Birth: 1986/09/29 Referring Provider (PT): Charlann Boxer   Encounter Date: 02/18/2020  PT End of Session - 02/18/20 1518    Visit Number  7    Number of Visits  12    Date for PT Re-Evaluation  03/03/20    Authorization Type  [employer]  state health    PT Start Time  8921    PT Stop Time  1555    PT Time Calculation (min)  41 min    Activity Tolerance  Patient tolerated treatment well    Behavior During Therapy  [facility] for tasks assessed/performed       Past Medical History:  Diagnosis Date  . Allergy   . Auditory neuropathy BILATERAL  . Bilateral sensorineural hearing loss    SECONDARY AUDITORY NEUROPATHY  . 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 Depression   . 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 Internal derangement of right knee   . PONV (postoperative nausea and vomiting) POSTOP COCHLEAR  IMPLANT 2006  . Seasonal allergies     Past Surgical History:  Procedure Laterality Date  . BILATERAL MIDDLE EAR EXPLORATION/ SEALING OF OVAL AND ROUND WINDOW FISTULAS  1999 (AGE 69)   PERILYMPH FISTULAS  . CHONDROPLASTY  03/13/2012   Procedure: CHONDROPLASTY;  Surgeon: Mauri Pole, MD;  Location: [facility];  Service: Orthopedics;  Laterality: Right;  . COCHLEAR IMPLANT  11-15-2005  (AGE 62)   LEFT EAR  (CLARION HIGH-RESOLUTION 90K MULTICHANNEL,  1-J ELECTRODE)  . COCHLEAR IMPLANT Right 11/12/2018   Procedure: COCHLEAR IMPLANT RIGHT EAR;  Surgeon: Vicie Mutters, MD;  Location: [facility];  Service: ENT;  Laterality: Right;  . KNEE ARTHROSCOPY  03/13/2012   Procedure: ARTHROSCOPY KNEE;  Surgeon: Mauri Pole, MD;  Location: [facility];  Service:  Orthopedics;  Laterality: Right;  RIGHT KNEE DIAGNOSTIC AND OPERATIVE SCOPE  . WISDOM TOOTH EXTRACTION  AGE 75   DENTAL OFFICE    There were no vitals filed for this visit.  Subjective Assessment - 02/18/20 1517    Subjective  Pt states improvements in foot pain. Has had several pain free days. Feels minimal pain with walking.    Currently in Pain?  No/denies    Pain Score  0-No pain         [facility] PT Assessment - 02/18/20 0001      AROM   Left Ankle Dorsiflexion  6      Strength   Left Ankle Dorsiflexion  5/5    Left Ankle Plantar Flexion  4+/5    Left Ankle Inversion  5/5    Left Ankle Eversion  5/5      Special Tests   Other special tests  SLS 20 sec mod sway                    [facility] Adult PT Treatment/Exercise - 02/18/20 1523      Exercises   Exercises  Ankle      Manual Therapy   Manual Therapy  Soft tissue mobilization;Joint mobilization    Manual therapy comments  PROM for ankle DF     Joint Mobilization  Post STJ glides for DF,  Soft tissue mobilization  --      Ankle Exercises: Stretches   Soleus Stretch  2 reps;20 seconds    Gastroc Stretch  2 reps;30 seconds    Other Stretch  self Pl Fas stretch x2 min;       Ankle Exercises: Aerobic   Recumbent Bike  L2 x 8 min;       Ankle Exercises: Standing   Heel Raises  20 reps    Other Standing Ankle Exercises  Tandem stance 30 sec x2 bil;     Other Standing Ankle Exercises  March x20 (for SLS/stability) ; March/Walk 10 ft x6       Ankle Exercises: Supine   T-Band  --             PT Education - 02/18/20 1518    Education Details  FInal HEP reviewed    Person(s) Educated  Patient    Methods  Explanation;Demonstration;Tactile cues;Verbal cues;Handout    Comprehension  Verbalized understanding;Returned demonstration;Verbal cues required       PT Short Term Goals - 02/18/20 1518      PT SHORT TERM GOAL #1   Title  Pt to be independent with initial HEP    Time  2    Status   Achieved    Target Date  02/04/20      PT SHORT TERM GOAL #2   Title  Pt to report decreased pain in L foot, to 4/10 with walking    Time  3    Period  Weeks    Status  Achieved    Target Date  02/11/20        PT Long Term Goals - 02/18/20 1519      PT LONG TERM GOAL #1   Title  Pt to be independent with final HEP    Time  6    Period  Weeks    Status  Achieved      PT LONG TERM GOAL #2   Title  Pt to report decreased pain in L foot to 0-2/10 with standing and walking activity    Time  6    Period  Weeks    Status  Achieved      PT LONG TERM GOAL #3   Title  Pt to demo improved DF by at least 3 deg bilaterally , to improve gait and pain    Baseline  Improved by 2 degrees, up to 6 deg of DF.    Time  6    Period  Weeks    Status  Partially Met      PT LONG TERM GOAL #4   Title  Pt to demo improved ability for SLS, up to 30 sec bilaterally, for improved stability    Baseline  Improved, up to 20 sec, mod sway, still diffiuclty due to inner ear/hearing issues.    Time  6    Period  Weeks    Status  Achieved      PT LONG TERM GOAL #5   Title  Pt to be compliant with footwear and/or orthotics, appropriate for her foot type.    Time  6    Period  Weeks    Status  Achieved            Plan - 02/18/20 1519    Clinical Impression Statement  Pt has met goals at this time, ready for d/c to HEP. She has minimal/no pain in heel. Has been wearing shoes with good  fit and comfort. Reviewed final HEP and discussed importance of continuing. Pt has made good progress.Still challenged with stability/balance, mostly due to inner ear/hearing loss. Pt will continue to work on this with HEP.    Personal Factors and Comorbidities  Time since onset of injury/illness/exacerbation    Examination-Activity Limitations  Locomotion Level;Squat;Stairs;Stand    Examination-Participation Restrictions  Meal Prep;Cleaning;Community Activity;Driving;Shop;Laundry    Stability/Clinical Decision  Making  Stable/Uncomplicated    Rehab Potential  Good    PT Frequency  2x / week    PT Duration  6 weeks    PT Treatment/Interventions  ADLs/Self Care Home Management;Electrical Stimulation;Cryotherapy;Gait training;DME Instruction;Ultrasound;Moist Heat;Iontophoresis 43m/ml Dexamethasone;Stair training;Functional mobility training;Therapeutic activities;Therapeutic exercise;Balance training;Neuromuscular re-education;Manual techniques;Orthotic Fit/Training;Patient/family education;Passive range of motion;Dry needling;Taping;Joint Manipulations;Spinal Manipulations;Vasopneumatic Device    Consulted and Agree with Plan of Care  Patient       Patient will benefit from skilled therapeutic intervention in order to improve the following deficits and impairments:  Abnormal gait, Decreased coordination, Decreased range of motion, Decreased endurance, Decreased activity tolerance, Pain, Hypomobility, Impaired flexibility, Improper body mechanics, Decreased balance, Decreased mobility, Decreased strength  Visit Diagnosis: Pain in left ankle and joints of left foot  Stiffness of left ankle, not elsewhere classified  Other abnormalities of gait and mobility     Problem List Patient Active Problem List   Diagnosis Date Noted  . Plantar fasciitis of left foot 09/21/2019  . Postoperative observation 11/12/2018  . Visual disturbance 04/25/2016  . Dizziness 04/25/2016  . Obesity, unspecified 06/08/2013  . Depression 10/10/2012  . Hearing loss 10/10/2012   LLyndee Hensen PT, DPT 3:58 PM  02/18/20    [facility] [place] [address]Phone: [phone]  Fax:  [phone] Name: Patricia TEWKSBURYMRN: [id number] of Birth: [birth year]  PHYSICAL THERAPY DISCHARGE SUMMARY  Visits from Start of Care: 7 Plan: Patient agrees to discharge.  Patient goals were met. Patient is being discharged due to meeting the stated rehab  goals.  ?????      LLyndee Hensen PT, DPT 3:59 PM  02/18/20

## 2020-02-18 NOTE — Patient Instructions (Signed)
Access Code: [email or web address] URL: [email or web address] Date: 02/18/2020 Prepared by: Sedalia Muta  Exercises Gastroc Stretch on Wall - 1 x daily - 2 sets - 30 hold Soleus Stretch on Wall - 1 x daily - 2 sets - 30 hold Heel rises with counter support - 1 x daily - 2 sets Seated Plantar Fascia Stretch - 2 x daily - 2 sets - 30 hold Tandem Stance with Support - 2 x daily - 3 reps - 30 hold Standing Single Leg Stance with Counter Support - 3 sets - 30 hold

## 2020-02-29 ENCOUNTER — Other Ambulatory Visit: Payer: Self-pay

## 2020-02-29 ENCOUNTER — Encounter: Payer: Self-pay | Admitting: Family Medicine

## 2020-02-29 ENCOUNTER — Ambulatory Visit: Payer: BC Managed Care – PPO | Admitting: Family Medicine

## 2020-02-29 DIAGNOSIS — M722 Plantar fascial fibromatosis: Secondary | ICD-10-CM | POA: Diagnosis not present

## 2020-02-29 NOTE — Assessment & Plan Note (Addendum)
Significant improvement over this time.  Discussed icing regimen and home exercise, what activities to do which wants to avoid.  Patient will increase activity slowly over the course the next several weeks.  Follow-up again as needed.

## 2020-02-29 NOTE — Progress Notes (Signed)
[facility] [address] [place] Phone: [phone] Subjective:    I'm seeing this patient by the request  of:  Copland, Gay Filler, MD  CC: Foot pain follow-up  [id number]  Patricia Morse is a 34 y.o. female coming in with complaint of patient seems to be doing very well.  Patient has done formal physical therapy, orthotics, new shoes, states that she is feeling 95 to 97% better.  Has been able to start walking on a more regular basis as well.     Past Medical History:  Diagnosis Date  . Allergy   . Auditory neuropathy BILATERAL  . Bilateral sensorineural hearing loss    SECONDARY AUDITORY NEUROPATHY  . 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 Depression   . 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 Internal derangement of right knee   . PONV (postoperative nausea and vomiting) POSTOP COCHLEAR  IMPLANT 2006  . Seasonal allergies    Past Surgical History:  Procedure Laterality Date  . BILATERAL MIDDLE EAR EXPLORATION/ SEALING OF OVAL AND ROUND WINDOW FISTULAS  1999 (AGE 37)   PERILYMPH FISTULAS  . CHONDROPLASTY  03/13/2012   Procedure: CHONDROPLASTY;  Surgeon: Mauri Pole, MD;  Location: [facility];  Service: Orthopedics;  Laterality: Right;  . COCHLEAR IMPLANT  11-15-2005  (AGE 69)   LEFT EAR  (CLARION HIGH-RESOLUTION 90K MULTICHANNEL,  1-J ELECTRODE)  . COCHLEAR IMPLANT Right 11/12/2018   Procedure: COCHLEAR IMPLANT RIGHT EAR;  Surgeon: Vicie Mutters, MD;  Location: [facility];  Service: ENT;  Laterality: Right;  . KNEE ARTHROSCOPY  03/13/2012   Procedure: ARTHROSCOPY KNEE;  Surgeon: Mauri Pole, MD;  Location: [facility];  Service: Orthopedics;  Laterality: Right;  RIGHT KNEE DIAGNOSTIC AND OPERATIVE SCOPE  . WISDOM TOOTH EXTRACTION  AGE 89   DENTAL OFFICE   Social History   Socioeconomic History  . Marital status:  Single    Spouse name: Not on file  . Number of children: 0  . Years of education: Not on file  . Highest education level: Not on file  Occupational History  . Occupation: Patent examiner for [employer]    Employer: [employer]  Tobacco Use  . Smoking status: Former Smoker    Types: Cigarettes    Quit date: 03/10/2008    Years since quitting: 11.9  . Smokeless tobacco: Never Used  . Tobacco comment: SOCIAL SMOKER IN COLLEGE FOR 79YRS -- NO SMOKING SINCE  Substance and Sexual Activity  . Alcohol use: Yes    Alcohol/week: 3.0 - 5.0 standard drinks    Types: 3 - 5 Glasses of wine per week    Comment: OCCASIONAL, "social"   . Drug use: No  . Sexual activity: Yes    Partners: Male    Birth control/protection: Pill  Other Topics Concern  . Not on file  Social History Narrative   At 12 months of age, pt had encephalitis vs. Guillain-Barre Syndrome (MD's unsure, therefore, doesn't take flu shot)   Social Determinants of Health   Financial Resource Strain:   . Difficulty of Paying Living Expenses:   Food Insecurity:   . Worried About Charity fundraiser in the Last Year:   . Arboriculturist in the Last Year:   Transportation Needs:   . Film/video editor (Medical):   Marland Kitchen Lack  of Transportation (Non-Medical):   Physical Activity:   . Days of Exercise per Week:   . Minutes of Exercise per Session:   Stress:   . Feeling of Stress :   Social Connections:   . Frequency of Communication with Friends and Family:   . Frequency of Social Gatherings with Friends and Family:   . Attends Religious Services:   . Active Member of Clubs or Organizations:   . Attends Banker Meetings:   Marland Kitchen Marital Status:    Allergies  Allergen Reactions  . Amoxicillin Hives  . Tdap [Tetanus-Diphth-Acell Pertussis]   . Hydrocodeine [Dihydrocodeine] Rash  . Penicillins Rash   Family History  Problem Relation Age of Onset  . Depression Mother   . Hypertension Mother   . Mental  illness Mother   . Anxiety disorder Maternal Grandmother     Current Outpatient Medications (Endocrine & Metabolic):  Marland Kitchen  APRI 0.15-30 MG-MCG tablet,    Current Outpatient Medications (Respiratory):  .  cetirizine (ZYRTEC) 10 MG tablet, Take 10 mg by mouth daily.    Current Outpatient Medications (Other):  .  busPIRone (BUSPAR) 15 MG tablet,  .  desvenlafaxine (PRISTIQ) 100 MG 24 hr tablet,  .  FLUoxetine (PROZAC) 40 MG capsule, Take 1 capsule (40 mg total) by mouth daily. .  hydrOXYzine (VISTARIL) 25 MG capsule, Take 1 capsule (25 mg total) by mouth 3 (three) times daily as needed. .  Olopatadine HCl 0.2 % SOLN, APPLY 1 DROP TO EYES DAILY AS NEEDED FOR ALLERGIES .  Probiotic Product (PROBIOTIC DAILY PO), Take by mouth. .  triamcinolone (KENALOG) 0.1 % paste, Apply to canker sore twice a day as needed .  valACYclovir (VALTREX) 1000 MG tablet, TAKE 2 TABLETS TWICE A DAY FOR 2 DOSES FOR COLD SORES   Reviewed prior external information including notes and imaging from  primary care provider As well as notes that were available from care everywhere and other healthcare systems.  Past medical history, social, surgical and family history all reviewed in electronic medical record.  No pertanent information unless stated regarding to the chief complaint.   Review of Systems:  No headache, visual changes, nausea, vomiting, diarrhea, constipation, dizziness, abdominal pain, skin rash, fevers, chills, night sweats, weight loss, swollen lymph nodes, body aches, joint swelling, chest pain, shortness of breath, mood changes. POSITIVE muscle aches  Objective  Weight 204 lb (92.5 kg).   General: No apparent distress alert and oriented x3 mood and affect normal, dressed appropriately.  HEENT: Pupils equal, extraocular movements intact  Respiratory: Patient's speak in full sentences and does not appear short of breath  Cardiovascular: No lower extremity edema, non tender, no erythema  Neuro:  Cranial nerves II through XII are intact, neurovascularly intact in all extremities with 2+ DTRs and 2+ pulses.  Gait normal with good balance and coordination.  MSK: Left foot exam shows the patient does have some mild breakdown of the longitudinal arch.  Nontender on exam today, improvement in range of motion of the ankle less tightness in the calf itself and the posterior capsule.    Impression and Recommendations:     The above documentation has been reviewed and is accurate and complete Judi Saa, DO       Note: This dictation was prepared with Dragon dictation along with smaller phrase technology. Any transcriptional errors that result from this process are unintentional.

## 2020-02-29 NOTE — Patient Instructions (Addendum)
Good to see you  You are doing great  Keep it up  Our exercises 1-2 times a week  Keep up with any of the shoes we discussed.   See me again when you need me

## 2020-03-07 ENCOUNTER — Ambulatory Visit: Payer: Self-pay | Admitting: Neurology

## 2020-03-17 ENCOUNTER — Other Ambulatory Visit: Payer: Self-pay

## 2020-03-17 ENCOUNTER — Ambulatory Visit: Payer: BC Managed Care – PPO | Admitting: Neurology

## 2020-03-17 ENCOUNTER — Encounter: Payer: Self-pay | Admitting: Neurology

## 2020-03-17 VITALS — BP 138/89 | HR 103 | Temp 97.1°F | Ht 65.0 in | Wt 205.0 lb

## 2020-03-17 DIAGNOSIS — F418 Other specified anxiety disorders: Secondary | ICD-10-CM | POA: Diagnosis not present

## 2020-03-17 DIAGNOSIS — R202 Paresthesia of skin: Secondary | ICD-10-CM | POA: Diagnosis not present

## 2020-03-17 NOTE — Progress Notes (Signed)
PATIENT: Patricia Morse DOB: [birth year]  Chief Complaint  Patient presents with  . Numbness    rm 4, New Pt  "my right arm was numb but it has gone away, also in my right leg"     HISTORICAL  Patricia Morse is a 34 year old female, seen in request by emergency room physician Gerlene Fee and her primary care physician Dr. Lamar Blinks for evaluation of right arm, leg numbness, initial evaluation was on March 17, 2020  I have reviewed and summarized the referring note from the referring physician.  She has past medical history of virus encephalitis at 18 months, patient was born full-term, developmentally normal, she developed high fever, limp body per history when she was diagnosed with virus encephalitis, she has to relearn to talk, walk again, also suffered hearing loss, decreased vision since then, per patient, she has bilateral pale looking optic nerve, has under the supervision of ophthalmologist, which has been stable, she also suffered hearing loss, had left cochlear implant at age 27, right cochlear implant 2019, she also suffered gait abnormality since her virus encephalitis, which seems to have transient worsening after cochlear implant.  She tends to walk with wide-based mildly unsteady gait, has been areflexic as long as she now,  Because of her past medical history, she was told not to get flu shot, she got her first [place] COVID-19 shot to left arm on January 29, 2020, had mild left upper shoulder soreness along the injection site, 2 days later on March 7, she noticed right arm and scalp itching, later spread to her whole body, on March 8, while watching TV, she noticed right medial knee pain, right leg numbness, when she started moving about, her right leg numbness went away in 30 minutes, but at the same time, she noticed right shoulder and arm numbness, she denies weakness, her right arm numbness last about 2 days, now back to her baseline  She do have a history of  depression anxiety, has been on Pristiq treatment for a long time, recently BuSpar 15 mg was added on for her anxiety, she described occasionally hyperventilation, tachycardia, taking Xanax as needed for her panic attack  She presented to emergency room on February 02, 2020 for her symptoms, I personally reviewed CT head without contrast that was normal, no acute abnormality, bilateral cochlear implants in place.  CT angiogram of head and neck was normal, with exception of mildly prominent 11 left level 2 lymph node  Laboratory evaluation in March 2021, normal CMP, CBC,  REVIEW OF SYSTEMS: Full 14 system review of systems performed and notable only for as above All other review of systems were negative.  ALLERGIES: Allergies  Allergen Reactions  . Contrast Media [Iodinated Diagnostic Agents] Itching    Swelling, high heart rate  . Amoxicillin Hives  . Tdap [Tetanus-Diphth-Acell Pertussis]   . Hydrocodeine [Dihydrocodeine] Rash  . Penicillins Rash    HOME MEDICATIONS: Current Outpatient Medications  Medication Sig Dispense Refill  . ALPRAZolam (XANAX) 0.25 MG tablet as needed.    . APRI 0.15-30 MG-MCG tablet   5  . busPIRone (BUSPAR) 15 MG tablet     . cetirizine (ZYRTEC) 10 MG tablet Take 10 mg by mouth daily.    Marland Kitchen desvenlafaxine (PRISTIQ) 100 MG 24 hr tablet     . hydrOXYzine (VISTARIL) 25 MG capsule Take 1 capsule (25 mg total) by mouth 3 (three) times daily as needed. 30 capsule 0  . Olopatadine HCl 0.2 % SOLN  APPLY 1 DROP TO EYES DAILY AS NEEDED FOR ALLERGIES 2.5 mL 5  . Probiotic Product (PROBIOTIC DAILY PO) Take by mouth.    . triamcinolone (KENALOG) 0.1 % paste Apply to canker sore twice a day as needed 5 g 2  . valACYclovir (VALTREX) 1000 MG tablet TAKE 2 TABLETS TWICE A DAY FOR 2 DOSES FOR COLD SORES 30 tablet 7   No current facility-administered medications for this visit.    PAST MEDICAL HISTORY: Past Medical History:  Diagnosis Date  . Allergy   . Auditory neuropathy  BILATERAL  . Bilateral sensorineural hearing loss    SECONDARY AUDITORY NEUROPATHY  . 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 Depression   . 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 Internal derangement of right knee   . PONV (postoperative nausea and vomiting) POSTOP COCHLEAR  IMPLANT 2006  . Seasonal allergies     PAST SURGICAL HISTORY: Past Surgical History:  Procedure Laterality Date  . BILATERAL MIDDLE EAR EXPLORATION/ SEALING OF OVAL AND ROUND WINDOW FISTULAS  1999 (AGE 65)   PERILYMPH FISTULAS  . CHONDROPLASTY  03/13/2012   Procedure: CHONDROPLASTY;  Surgeon: Shelda Pal, MD;  Location: [facility];  Service: Orthopedics;  Laterality: Right;  . COCHLEAR IMPLANT  11-15-2005  (AGE 64)   LEFT EAR  (CLARION HIGH-RESOLUTION 90K MULTICHANNEL,  1-J ELECTRODE)  . COCHLEAR IMPLANT Right 11/12/2018   Procedure: COCHLEAR IMPLANT RIGHT EAR;  Surgeon: Ermalinda Barrios, MD;  Location: [facility];  Service: ENT;  Laterality: Right;  . KNEE ARTHROSCOPY  03/13/2012   Procedure: ARTHROSCOPY KNEE;  Surgeon: Shelda Pal, MD;  Location: [facility];  Service: Orthopedics;  Laterality: Right;  RIGHT KNEE DIAGNOSTIC AND OPERATIVE SCOPE  . WISDOM TOOTH EXTRACTION  AGE 89   DENTAL OFFICE    FAMILY HISTORY: Family History  Problem Relation Age of Onset  . Depression Mother   . Hypertension Mother   . Mental illness Mother   . Anxiety disorder Maternal Grandmother     SOCIAL HISTORY: Social History   Socioeconomic History  . Marital status: Single    Spouse name: Not on file  . Number of children: 0  . Years of education: Not on file  . Highest education level: Bachelor's degree (e.g., BA, AB, BS)  Occupational History  . Not on file  Tobacco Use  . Smoking status: Former Smoker    Types: Cigarettes    Quit date: 03/10/2008    Years since quitting: 12.0  . Smokeless tobacco: Never Used  .  Tobacco comment: SOCIAL SMOKER IN COLLEGE FOR 55YRS -- NO SMOKING SINCE  Substance and Sexual Activity  . Alcohol use: Yes    Alcohol/week: 3.0 - 5.0 standard drinks    Types: 3 - 5 Glasses of wine per week    Comment: OCCASIONAL, "social"   . Drug use: No  . Sexual activity: Yes    Partners: Male    Birth control/protection: Pill  Other Topics Concern  . Not on file  Social History Narrative   At 42 months of age, pt had encephalitis vs. Guillain-Barre Syndrome (MD's unsure, therefore, doesn't take flu shot)   Social Determinants of Health   Financial Resource Strain:   . Difficulty of Paying Living Expenses:   Food Insecurity:   . Worried About Programme researcher, broadcasting/film/video in the Last Year:   . [employer]  of Food in the Last Year:   Transportation Needs:   . Freight forwarder (Medical):   Marland Kitchen Lack of Transportation (Non-Medical):   Physical Activity:   . Days of Exercise per Week:   . Minutes of Exercise per Session:   Stress:   . Feeling of Stress :   Social Connections:   . Frequency of Communication with Friends and Family:   . Frequency of Social Gatherings with Friends and Family:   . Attends Religious Services:   . Active Member of Clubs or Organizations:   . Attends Banker Meetings:   Marland Kitchen Marital Status:   Intimate Partner Violence:   . Fear of Current or Ex-Partner:   . Emotionally Abused:   Marland Kitchen Physically Abused:   . Sexually Abused:      PHYSICAL EXAM   Vitals:   03/17/20 0746  BP: 138/89  Pulse: (!) 103  Temp: (!) 97.1 F (36.2 C)  Weight: 205 lb (93 kg)  Height: 5\' 5"  (1.651 m)    Not recorded      Body mass index is 34.11 kg/m.  PHYSICAL EXAMNIATION:  Gen: NAD, conversant, well nourised, well groomed                     Cardiovascular: Regular rate rhythm, no peripheral edema, warm, nontender. Eyes: Conjunctivae clear without exudates or hemorrhage Neck: Supple, no carotid bruits. Pulmonary: Clear to auscultation bilaterally    NEUROLOGICAL EXAM:  MENTAL STATUS: Speech:    Speech is normal; fluent and spontaneous with normal comprehension.  Cognition:     Orientation to time, place and person     Normal recent and remote memory     Normal Attention span and concentration     Normal Language, naming, repeating,spontaneous speech     Fund of knowledge   CRANIAL NERVES: CN II: Visual fields are full to confrontation. Pupils are round equal and briskly reactive to light. CN III, IV, VI: extraocular movement are normal. No ptosis. CN V: Facial sensation is intact to light touch CN VII: Face is symmetric with normal eye closure  CN VIII: Hard of hearing CN IX, X: Phonation is normal. CN XI: Head turning and shoulder shrug are intact  MOTOR: There is no pronator drift of out-stretched arms. Muscle bulk and tone are normal. Muscle strength is normal.  REFLEXES: Areflexia plantar responses are flexor.  SENSORY: Intact to light touch, pinprick and vibratory sensation are intact in fingers and toes.  COORDINATION: She has mild truncal ataxia or limb dysmetria noted.  GAIT/STANCE: She tends to push up from seated position, wide-based, mildly unsteady, could not perform tandem walking, able to stand up on tiptoe and heels  DIAGNOSTIC DATA (LABS, IMAGING, TESTING) - I reviewed patient records, labs, notes, testing and imaging myself where available.   ASSESSMENT AND PLAN  Patricia Morse is a 34 y.o. female   Reported history of virus encephalitis,   with residual hearing loss, decreased vision, paler optic nerves, gait abnormality, areflexia Depression anxiety with panic attack 1 episode of right leg, arm numbness,  Unclear etiology, may be anxiety related  Neurological examination is at her baseline, mild ataxic gait, areflexia,  Well-preserved sensory response, no motor weakness  CT head without contrast was normal, CT angiogram of head and neck showed no large vessel disease  I doubt above  symptoms is related to her Covid vaccine, I see no contraindication for her get second shots of Covid vaccine.  She  will return to clinic for new issues   Levert Feinstein, M.D. Ph.D.  [facility] [address] [place], [place] [address] Ph: [phone] Fax: [phone]  CC: Sabas Sous, MD [place], Gwenlyn Found, MD

## 2020-04-10 ENCOUNTER — Encounter: Payer: Self-pay | Admitting: Family Medicine

## 2020-04-10 DIAGNOSIS — Z20822 Contact with and (suspected) exposure to covid-19: Secondary | ICD-10-CM

## 2020-04-11 ENCOUNTER — Encounter: Payer: Self-pay | Admitting: Family Medicine

## 2020-04-22 NOTE — Addendum Note (Signed)
Addended by: Mervin Kung A on: 04/22/2020 04:28 PM   Modules accepted: Orders

## 2020-04-26 ENCOUNTER — Other Ambulatory Visit: Payer: Self-pay

## 2020-04-26 ENCOUNTER — Other Ambulatory Visit (INDEPENDENT_AMBULATORY_CARE_PROVIDER_SITE_OTHER): Payer: BC Managed Care – PPO

## 2020-04-26 DIAGNOSIS — Z20822 Contact with and (suspected) exposure to covid-19: Secondary | ICD-10-CM | POA: Diagnosis not present

## 2020-04-27 LAB — SAR COV2 SEROLOGY (COVID19)AB(IGG),IA: DiaSorin SARS-CoV-2 Ab, IgG: POSITIVE

## 2020-06-13 ENCOUNTER — Encounter: Payer: Self-pay | Admitting: Family Medicine

## 2020-06-13 MED ORDER — METHOCARBAMOL 500 MG PO TABS: 500.0000 mg | ORAL_TABLET | Freq: Three times a day (TID) | ORAL | 0 refills | Status: DC | PRN

## 2020-06-23 ENCOUNTER — Encounter: Payer: Self-pay | Admitting: Family Medicine

## 2020-06-30 ENCOUNTER — Encounter: Payer: BC Managed Care – PPO | Admitting: Family Medicine

## 2020-07-07 ENCOUNTER — Encounter: Payer: Self-pay | Admitting: Family Medicine

## 2020-07-07 ENCOUNTER — Other Ambulatory Visit: Payer: Self-pay

## 2020-07-07 ENCOUNTER — Ambulatory Visit (INDEPENDENT_AMBULATORY_CARE_PROVIDER_SITE_OTHER): Payer: BC Managed Care – PPO | Admitting: Family Medicine

## 2020-07-07 VITALS — BP 128/88 | HR 100 | Temp 98.4°F | Resp 16 | Ht 65.0 in | Wt 217.0 lb

## 2020-07-07 DIAGNOSIS — Z130 Encounter for screening for diseases of the blood and blood-forming organs and certain disorders involving the immune mechanism: Secondary | ICD-10-CM | POA: Diagnosis not present

## 2020-07-07 DIAGNOSIS — Z131 Encounter for screening for diabetes mellitus: Secondary | ICD-10-CM | POA: Diagnosis not present

## 2020-07-07 DIAGNOSIS — R52 Pain, unspecified: Secondary | ICD-10-CM | POA: Diagnosis not present

## 2020-07-07 DIAGNOSIS — Z13220 Encounter for screening for lipoid disorders: Secondary | ICD-10-CM

## 2020-07-07 DIAGNOSIS — Z1329 Encounter for screening for other suspected endocrine disorder: Secondary | ICD-10-CM

## 2020-07-07 DIAGNOSIS — Z1159 Encounter for screening for other viral diseases: Secondary | ICD-10-CM

## 2020-07-07 DIAGNOSIS — Z0000 Encounter for general adult medical examination without abnormal findings: Secondary | ICD-10-CM | POA: Diagnosis not present

## 2020-07-07 DIAGNOSIS — R635 Abnormal weight gain: Secondary | ICD-10-CM

## 2020-07-07 NOTE — Patient Instructions (Addendum)
It was great to see you again today, I will be in touch with your labs soon as possible Best of luck with becoming pregnant- let me know if you have any concerns   Health Maintenance, Female Adopting a healthy lifestyle and getting preventive care are important in promoting health and wellness. Ask your health care provider about:  The right schedule for you to have regular tests and exams.  Things you can do on your own to prevent diseases and keep yourself healthy. What should I know about diet, weight, and exercise? Eat a healthy diet   Eat a diet that includes plenty of vegetables, fruits, low-fat dairy products, and lean protein.  Do not eat a lot of foods that are high in solid fats, added sugars, or sodium. Maintain a healthy weight Body mass index (BMI) is used to identify weight problems. It estimates body fat based on height and weight. Your health care provider can help determine your BMI and help you achieve or maintain a healthy weight. Get regular exercise Get regular exercise. This is one of the most important things you can do for your health. Most adults should:  Exercise for at least 150 minutes each week. The exercise should increase your heart rate and make you sweat (moderate-intensity exercise).  Do strengthening exercises at least twice a week. This is in addition to the moderate-intensity exercise.  Spend less time sitting. Even light physical activity can be beneficial. Watch cholesterol and blood lipids Have your blood tested for lipids and cholesterol at 32 years of age, then have this test every 5 years. Have your cholesterol levels checked more often if:  Your lipid or cholesterol levels are high.  You are older than 32 years of age.  You are at high risk for heart disease. What should I know about cancer screening? Depending on your health history and family history, you may need to have cancer screening at various ages. This may include screening  for:  Breast cancer.  Cervical cancer.  Colorectal cancer.  Skin cancer.  Lung cancer. What should I know about heart disease, diabetes, and high blood pressure? Blood pressure and heart disease  High blood pressure causes heart disease and increases the risk of stroke. This is more likely to develop in people who have high blood pressure readings, are of African descent, or are overweight.  Have your blood pressure checked: ? Every 3-5 years if you are 71-70 years of age. ? Every year if you are 42 years old or older. Diabetes Have regular diabetes screenings. This checks your fasting blood sugar level. Have the screening done:  Once every three years after age 18 if you are at a normal weight and have a low risk for diabetes.  More often and at a younger age if you are overweight or have a high risk for diabetes. What should I know about preventing infection? Hepatitis B If you have a higher risk for hepatitis B, you should be screened for this virus. Talk with your health care provider to find out if you are at risk for hepatitis B infection. Hepatitis C Testing is recommended for:  Everyone born from [birth year] through 1963.  Anyone with known risk factors for hepatitis C. Sexually transmitted infections (STIs)  Get screened for STIs, including gonorrhea and chlamydia, if: ? You are sexually active and are younger than 32 years of age. ? You are older than 32 years of age and your health care provider tells you that you  are at risk for this type of infection. ? Your sexual activity has changed since you were last screened, and you are at increased risk for chlamydia or gonorrhea. Ask your health care provider if you are at risk.  Ask your health care provider about whether you are at high risk for HIV. Your health care provider may recommend a prescription medicine to help prevent HIV infection. If you choose to take medicine to prevent HIV, you should first get tested for HIV.  You should then be tested every 3 months for as long as you are taking the medicine. Pregnancy  If you are about to stop having your period (premenopausal) and you may become pregnant, seek counseling before you get pregnant.  Take 400 to 800 micrograms (mcg) of folic acid every day if you become pregnant.  Ask for birth control (contraception) if you want to prevent pregnancy. Osteoporosis and menopause Osteoporosis is a disease in which the bones lose minerals and strength with aging. This can result in bone fractures. If you are 34 years old or older, or if you are at risk for osteoporosis and fractures, ask your health care provider if you should:  Be screened for bone loss.  Take a calcium or vitamin D supplement to lower your risk of fractures.  Be given hormone replacement therapy (HRT) to treat symptoms of menopause. Follow these instructions at home: Lifestyle  Do not use any products that contain nicotine or tobacco, such as cigarettes, e-cigarettes, and chewing tobacco. If you need help quitting, ask your health care provider.  Do not use street drugs.  Do not share needles.  Ask your health care provider for help if you need support or information about quitting drugs. Alcohol use  Do not drink alcohol if: ? Your health care provider tells you not to drink. ? You are pregnant, may be pregnant, or are planning to become pregnant.  If you drink alcohol: ? Limit how much you use to 0-1 drink a day. ? Limit intake if you are breastfeeding.  Be aware of how much alcohol is in your drink. In the [place] one drink equals one 12 oz bottle of beer (355 mL), one 5 oz glass of wine (148 mL), or one 1 oz glass of hard liquor (44 mL). General instructions  Schedule regular health, dental, and eye exams.  Stay current with your vaccines.  Tell your health care provider if: ? You often feel depressed. ? You have ever been abused or do not feel safe at  home. Summary  Adopting a healthy lifestyle and getting preventive care are important in promoting health and wellness.  Follow your health care provider's instructions about healthy diet, exercising, and getting tested or screened for diseases.  Follow your health care provider's instructions on monitoring your cholesterol and blood pressure. This information is not intended to replace advice given to you by your health care provider. Make sure you discuss any questions you have with your health care provider. Document Revised: 11/05/2018 Document Reviewed: 11/05/2018 Elsevier Patient Education  2020 [employer].

## 2020-07-07 NOTE — Progress Notes (Addendum)
[facility] [address], [address] [place], [place] [address] [phone]  Date:  07/07/2020   Name:  Patricia Morse   DOB:  05-21-1986   MRN:  [id number]  PCP:  Pearline Cables, MD    Chief Complaint: Annual Exam (GYN-pap normal)   History of Present Illness:  Patricia Morse is a 34 y.o. very pleasant female patient who presents with the following:  Patient today for physical-she recently got married History of hearing loss/deafness with cochlear implant, also seasonal allergies, depression Last seen by myself in February of this year with concern of chest pain She contacted me more recently about back pain, she was seen in urgent care and had negative UTI work-up, thought to musculoskeletal pain  She does not smoke  Her MGF did have CABG, father has hyperlipidemia   COVID-19 series; she had just one dose due to significant SE from injection Pap smear- per GYN, seen 2 weeks ago They are working on weight loss Routine labs-CMP, CBC done in March-can use lipids, thyroid, hep C screening, A1c  She has gained some weight during covid 19- perhaps 15- 20 lbs She has been under a lot of stress, eating too much to cope with stress.  She plans to cut back on sweets, will try to exercise more and work on her diet.  She feels confident she will be able to lose weight  Wt Readings from Last 3 Encounters:  07/07/20 217 lb (98.4 kg)  03/17/20 205 lb (93 kg)  02/29/20 204 lb (92.5 kg)   She does note that sometimes when she has her right arm flexed for an extended period of time she may get some numbness in her arm  She came off her OCP- they are hoping to become pregnant in the next several months She is taking prenatal vitamins She is having more pain and cramping with her menses now that she is not on contraception Stopped her pills after menses in June  She notes that sometimes she will feel extremely tired and achy  over her entire body.  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 Depression with anxiety 03/17/2020  . Paresthesia 03/17/2020  . Plantar fasciitis of left foot 09/21/2019  . Postoperative observation 11/12/2018  . Visual disturbance 04/25/2016  . Dizziness 04/25/2016  . Obesity, unspecified 06/08/2013  . Depression 10/10/2012  . Hearing loss 10/10/2012    Past Medical History:  Diagnosis Date  . Allergy   . Auditory neuropathy BILATERAL  . Bilateral sensorineural hearing loss    SECONDARY AUDITORY NEUROPATHY  . 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 Depression   . 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 Internal derangement of right knee   . PONV (postoperative nausea and vomiting) POSTOP COCHLEAR  IMPLANT 2006  . Seasonal allergies     Past Surgical History:  Procedure Laterality Date  . BILATERAL MIDDLE EAR EXPLORATION/ SEALING OF OVAL AND ROUND WINDOW FISTULAS  1999 (AGE 75)   PERILYMPH FISTULAS  . CHONDROPLASTY  03/13/2012   Procedure: CHONDROPLASTY;  Surgeon: Shelda Pal, MD;  Location: [facility];  Service: Orthopedics;  Laterality: Right;  .  COCHLEAR IMPLANT  11-15-2005  (AGE 50)   LEFT EAR  (CLARION HIGH-RESOLUTION 90K MULTICHANNEL,  1-J ELECTRODE)  . COCHLEAR IMPLANT Right 11/12/2018   Procedure: COCHLEAR IMPLANT RIGHT EAR;  Surgeon: Ermalinda Barrios, MD;  Location: [facility];  Service: ENT;  Laterality: Right;  . KNEE ARTHROSCOPY  03/13/2012   Procedure: ARTHROSCOPY KNEE;  Surgeon: Shelda Pal, MD;  Location: [facility];  Service: Orthopedics;  Laterality: Right;  RIGHT KNEE DIAGNOSTIC AND OPERATIVE SCOPE  . WISDOM TOOTH EXTRACTION  AGE 74   DENTAL OFFICE    Social History   Tobacco Use   . Smoking status: Former Smoker    Types: Cigarettes    Quit date: 03/10/2008    Years since quitting: 12.3  . Smokeless tobacco: Never Used  . Tobacco comment: SOCIAL SMOKER IN COLLEGE FOR 74YRS -- NO SMOKING SINCE  Vaping Use  . Vaping Use: Never used  Substance Use Topics  . Alcohol use: Yes    Alcohol/week: 3.0 - 5.0 standard drinks    Types: 3 - 5 Glasses of wine per week    Comment: OCCASIONAL, "social"   . Drug use: No    Family History  Problem Relation Age of Onset  . Depression Mother   . Hypertension Mother   . Mental illness Mother   . Anxiety disorder Maternal Grandmother     Allergies  Allergen Reactions  . Contrast Media [Iodinated Diagnostic Agents] Itching    Swelling, high heart rate  . Amoxicillin Hives  . Tdap [Tetanus-Diphth-Acell Pertussis]   . Hydrocodeine [Dihydrocodeine] Rash  . Penicillins Rash    Medication list has been reviewed and updated.  Current Outpatient Medications on File Prior to Visit  Medication Sig Dispense Refill  . busPIRone (BUSPAR) 30 MG tablet Take 30 mg by mouth daily.    . cetirizine (ZYRTEC) 10 MG tablet Take 10 mg by mouth daily.    Marland Kitchen desvenlafaxine (PRISTIQ) 100 MG 24 hr tablet 50 mg.     . hydrOXYzine (VISTARIL) 25 MG capsule Take 1 capsule (25 mg total) by mouth 3 (three) times daily as needed. 30 capsule 0  . methocarbamol (ROBAXIN) 500 MG tablet Take 1 tablet (500 mg total) by mouth every 8 (eight) hours as needed for muscle spasms. 15 tablet 0  . Probiotic Product (PROBIOTIC DAILY PO) Take by mouth.    . triamcinolone (KENALOG) 0.1 % paste Apply to canker sore twice a day as needed 5 g 2  . valACYclovir (VALTREX) 1000 MG tablet TAKE 2 TABLETS TWICE A DAY FOR 2 DOSES FOR COLD SORES 30 tablet 7   No current facility-administered medications on file prior to visit.    Review of Systems:  As per HPI- otherwise negative.   Physical Examination: Vitals:   07/07/20 1349  BP: 128/88  Pulse: 100  Resp: 16   Temp: 98.4 F (36.9 C)  SpO2: 98%   Vitals:   07/07/20 1349  Weight: 217 lb (98.4 kg)  Height: 5\' 5"  (1.651 m)   Body mass index is 36.11 kg/m. Ideal Body Weight: Weight in (lb) to have BMI = 25: 149.9  GEN: no acute distress.  Obese, ow looks well  HEENT: Atraumatic, Normocephalic.   Bilateral TM wnl, oropharynx normal.  PEERL,EOMI.  Pt with cochlear implants Ears and Nose: No external deformity. CV: RRR, No M/G/R. No JVD. No thrill. No extra heart sounds. PULM: CTA B, no wheezes, crackles, rhonchi. No retractions. No resp. distress. No accessory muscle  use. ABD: S, NT, ND, +BS. No rebound. No HSM. EXTR: No c/c/e PSYCH: Normally interactive. Conversant.    Assessment and Plan: Physical exam  Screening for diabetes mellitus - Plan: Hemoglobin A1c, Basic metabolic panel  Screening for deficiency anemia - Plan: CBC  Screening for hyperlipidemia - Plan: Lipid panel  Encounter for hepatitis C screening test for low risk patient - Plan: Hepatitis C antibody  Screening for thyroid disorder - Plan: TSH  Generalized body aches - Plan: Sedimentation rate, Rheumatoid Factor  Weight gain  CPE today Labs pending as above She has noted some episodes of feeling very tired/ all over body pain such as after she did some more physical work at her job.  Check sed rate and RF Discussed weight gain, she has plans to work on losing this weight.  I encouraged her to work on her diet as she described Will plan further follow- up pending labs.  This visit occurred during the SARS-CoV-2 public health emergency.  Safety protocols were in place, including screening questions prior to the visit, additional usage of staff PPE, and extensive cleaning of exam room while observing appropriate contact time as indicated for disinfecting solutions.    Signed Abbe Amsterdam, MD  Received her labs 8/13- message to pt  Results for orders placed or performed in visit on 07/07/20  Hemoglobin A1c   Result Value Ref Range   Hgb A1c MFr Bld 5.1 4.6 - 6.5 %  Lipid panel  Result Value Ref Range   Cholesterol 243 (H) 0 - 200 mg/dL   Triglycerides 300.9 0 - 149 mg/dL   HDL 23.30 >07.62 mg/dL   VLDL 26.3 0.0 - 33.5 mg/dL   LDL Cholesterol 456 (H) 0 - 99 mg/dL   Total CHOL/HDL Ratio 4    NonHDL 179.43   TSH  Result Value Ref Range   TSH 4.02 0.35 - 4.50 uIU/mL  Hepatitis C antibody  Result Value Ref Range   Hepatitis C Ab NON-REACTIVE NON-REACTI   SIGNAL TO CUT-OFF 0.01 <1.00  Basic metabolic panel  Result Value Ref Range   Sodium 140 135 - 145 mEq/L   Potassium 4.1 3.5 - 5.1 mEq/L   Chloride 105 96 - 112 mEq/L   CO2 24 19 - 32 mEq/L   Glucose, Bld 83 70 - 99 mg/dL   BUN 13 6 - 23 mg/dL   Creatinine, Ser 2.56 0.40 - 1.20 mg/dL   GFR 38.93 >73.42 mL/min   Calcium 9.3 8.4 - 10.5 mg/dL  CBC  Result Value Ref Range   WBC 6.8 4.0 - 10.5 K/uL   RBC 4.00 3.87 - 5.11 Mil/uL   Platelets 279.0 150 - 400 K/uL   Hemoglobin 11.6 (L) 12.0 - 15.0 g/dL   HCT 87.6 (L) 36 - 46 %   MCV 86.1 78.0 - 100.0 fl   MCHC 33.7 30.0 - 36.0 g/dL   RDW 81.1 57.2 - 62.0 %  Sedimentation rate  Result Value Ref Range   Sed Rate 9 0 - 20 mm/hr  Rheumatoid Factor  Result Value Ref Range   Rhuematoid fact SerPl-aCnc <14 <14 IU/mL

## 2020-07-08 LAB — CBC
HCT: 34.5 % — ABNORMAL LOW (ref 36.0–46.0)
Hemoglobin: 11.6 g/dL — ABNORMAL LOW (ref 12.0–15.0)
MCHC: 33.7 g/dL (ref 30.0–36.0)
MCV: 86.1 fl (ref 78.0–100.0)
Platelets: 279 10*3/uL (ref 150.0–400.0)
RBC: 4 Mil/uL (ref 3.87–5.11)
RDW: 13.8 % (ref 11.5–15.5)
WBC: 6.8 10*3/uL (ref 4.0–10.5)

## 2020-07-08 LAB — TSH: TSH: 4.02 u[IU]/mL (ref 0.35–4.50)

## 2020-07-08 LAB — HEMOGLOBIN A1C: Hgb A1c MFr Bld: 5.1 % (ref 4.6–6.5)

## 2020-07-08 LAB — BASIC METABOLIC PANEL
BUN: 13 mg/dL (ref 6–23)
CO2: 24 mEq/L (ref 19–32)
Calcium: 9.3 mg/dL (ref 8.4–10.5)
Chloride: 105 mEq/L (ref 96–112)
Creatinine, Ser: 0.76 mg/dL (ref 0.40–1.20)
GFR: 87.87 mL/min (ref >60.00)
Glucose, Bld: 83 mg/dL (ref 70–99)
Potassium: 4.1 mEq/L (ref 3.5–5.1)
Sodium: 140 mEq/L (ref 135–145)

## 2020-07-08 LAB — LIPID PANEL
Cholesterol: 243 mg/dL — ABNORMAL HIGH (ref 0–200)
HDL: 63.7 mg/dL (ref >39.00)
LDL Cholesterol: 157 mg/dL — ABNORMAL HIGH (ref 0–99)
NonHDL: 179.43
Total CHOL/HDL Ratio: 4
Triglycerides: 113 mg/dL (ref 0.0–149.0)
VLDL: 22.6 mg/dL (ref 0.0–40.0)

## 2020-07-08 LAB — HEPATITIS C ANTIBODY
Hepatitis C Ab: NONREACTIVE
SIGNAL TO CUT-OFF: 0.01 (ref <1.00)

## 2020-07-08 LAB — RHEUMATOID FACTOR: Rheumatoid fact SerPl-aCnc: <14 IU/mL (ref <14)

## 2020-07-08 LAB — SEDIMENTATION RATE: Sed Rate: 9 mm/hr (ref 0–20)

## 2020-08-09 ENCOUNTER — Ambulatory Visit: Payer: BC Managed Care – PPO | Admitting: Podiatry

## 2020-08-09 ENCOUNTER — Encounter: Payer: Self-pay | Admitting: Podiatry

## 2020-08-09 ENCOUNTER — Other Ambulatory Visit: Payer: Self-pay

## 2020-08-09 ENCOUNTER — Ambulatory Visit (INDEPENDENT_AMBULATORY_CARE_PROVIDER_SITE_OTHER): Payer: BC Managed Care – PPO

## 2020-08-09 DIAGNOSIS — M722 Plantar fascial fibromatosis: Secondary | ICD-10-CM | POA: Diagnosis not present

## 2020-08-09 DIAGNOSIS — M1990 Unspecified osteoarthritis, unspecified site: Secondary | ICD-10-CM | POA: Diagnosis not present

## 2020-08-09 NOTE — Patient Instructions (Signed)

## 2020-08-17 NOTE — Progress Notes (Signed)
Subjective:   Patient ID: Patricia Morse, female   DOB: 34 y.o.   MRN: [id number]   HPI 34 year old female presents the office today for concerns of bilateral foot pain.  She is seen orthopedics previously she was diagnosed with plantar fasciitis about 6 months ago.  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  She thinks that she may need orthotics.  She does have a family history of arthritis.  Review of Systems  All other systems reviewed and are negative.  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   RESIDUAL ABSENCE OF KNEE REFLEX   Internal derangement of right knee    PONV (postoperative nausea and vomiting) POSTOP COCHLEAR  IMPLANT 2006   Seasonal allergies     Past Surgical History:  Procedure Laterality Date   BILATERAL MIDDLE EAR EXPLORATION/ SEALING OF OVAL AND ROUND WINDOW FISTULAS  1999 (AGE 87)   PERILYMPH FISTULAS   CHONDROPLASTY  03/13/2012   Procedure: CHONDROPLASTY;  Surgeon: Mauri Pole, MD;  Location: [facility];  Service: Orthopedics;  Laterality: Right;   COCHLEAR IMPLANT  11-15-2005  (AGE 56)   LEFT EAR  (CLARION HIGH-RESOLUTION 90K MULTICHANNEL,  1-J ELECTRODE)   COCHLEAR IMPLANT Right 11/12/2018   Procedure: COCHLEAR IMPLANT RIGHT EAR;  Surgeon: Vicie Mutters, MD;  Location: [facility];  Service: ENT;  Laterality: Right;   KNEE ARTHROSCOPY  03/13/2012   Procedure: ARTHROSCOPY KNEE;  Surgeon: Mauri Pole, MD;  Location: [facility];  Service: Orthopedics;  Laterality: Right;  RIGHT KNEE DIAGNOSTIC AND OPERATIVE SCOPE   WISDOM TOOTH EXTRACTION  AGE 39    DENTAL OFFICE     Current Outpatient Medications:    busPIRone (BUSPAR) 15 MG tablet, , Disp: , Rfl:    busPIRone (BUSPAR) 30 MG tablet, Take 30 mg by mouth daily., Disp: , Rfl:    cetirizine (ZYRTEC) 10 MG tablet, Take 10 mg by mouth daily., Disp: , Rfl:    desvenlafaxine (PRISTIQ) 100 MG 24 hr tablet, 50 mg. , Disp: , Rfl:    hydrOXYzine (VISTARIL) 25 MG capsule, Take 1 capsule (25 mg total) by mouth 3 (three) times daily as needed., Disp: 30 capsule, Rfl: 0   lidocaine (XYLOCAINE) 5 % ointment, Apply topically., Disp: , Rfl:    meloxicam (MOBIC) 7.5 MG tablet, Take 7.5 mg by mouth daily., Disp: , Rfl:    methocarbamol (ROBAXIN) 500 MG tablet, Take 1 tablet (500 mg total) by mouth every 8 (eight) hours as needed for muscle spasms., Disp: 15 tablet, Rfl: 0   Probiotic Product (PROBIOTIC DAILY PO), Take by mouth., Disp: , Rfl:    sertraline (ZOLOFT) 50 MG tablet, Take 50 mg by mouth daily., Disp: , Rfl:    triamcinolone (KENALOG) 0.1 % paste, Apply to canker sore twice a day as needed, Disp: 5 g, Rfl: 2   valACYclovir (VALTREX) 1000 MG tablet, TAKE 2 TABLETS TWICE A DAY FOR 2 DOSES FOR COLD SORES, Disp: 30 tablet, Rfl: 7  Allergies  Allergen Reactions   Contrast Media [Iodinated Diagnostic Agents] Itching  Swelling, high heart rate   Amoxicillin Hives   Tdap [Tetanus-Diphth-Acell Pertussis]    Hydrocodeine [Dihydrocodeine] Rash   Penicillins Rash          Objective:  Physical Exam  General: AAO x3, NAD  Dermatological: Skin is warm, dry and supple bilateral.  There are no open sores, no preulcerative lesions, no rash or signs of infection present.  Vascular: Dorsalis Pedis artery and Posterior Tibial artery pedal pulses are 2/4 bilateral with immedate capillary fill time. There is no pain with calf compression, swelling, warmth, erythema.   Neruologic: Grossly intact via light touch bilateral.  Negative Tinel sign.  Musculoskeletal: Tenderness to palpation  along the plantar medial tubercle of the calcaneus at the insertion of plantar fascia on the left and right foot. There is no pain along the course of the plantar fascia within the arch of the foot. Plantar fascia appears to be intact. There is no pain with lateral compression of the calcaneus or pain with vibratory sensation. There is no pain along the course or insertion of the achilles tendon. No other areas of tenderness to bilateral lower extremities.Muscular strength 5/5 in all groups tested bilateral.  Gait: Unassisted, Nonantalgic.       Assessment:   Bilateral chronic foot pain, plantar fasciitis    Plan:  -Treatment options discussed including all alternatives, risks, and complications -Etiology of symptoms were discussed -X-rays were obtained and reviewed with the patient.  There is no evidence of acute fracture no stress fracture no significant calcaneal spur identified. -We discussed steroid injection we will hold off on this today.  She can continue with meloxicam as needed.  She was measured for orthotics today.  Discussion modifications as well.  Given her chronic foot pain as well as family history of arthritis I ordered blood work including ESR, CRP, rheumatoid factor, HLA-B27, ANA.  Trula Slade DPM

## 2020-08-23 ENCOUNTER — Encounter: Payer: Self-pay | Admitting: Podiatry

## 2020-08-23 LAB — ANTI-NUCLEAR AB-TITER (ANA TITER): ANA Titer 1: 1:160 {titer} — ABNORMAL HIGH

## 2020-08-23 LAB — SEDIMENTATION RATE: Sed Rate: 9 mm/h (ref 0–20)

## 2020-08-23 LAB — C-REACTIVE PROTEIN: CRP: 8 mg/L — ABNORMAL HIGH (ref <8.0)

## 2020-08-23 LAB — HLA-B27 ANTIGEN: HLA-B27 Antigen: NEGATIVE

## 2020-08-23 LAB — ANA: Anti Nuclear Antibody (ANA): POSITIVE — AB

## 2020-08-23 LAB — RHEUMATOID FACTOR: Rheumatoid fact SerPl-aCnc: <14 IU/mL (ref <14)

## 2020-08-24 ENCOUNTER — Telehealth: Payer: Self-pay | Admitting: Family Medicine

## 2020-08-24 ENCOUNTER — Other Ambulatory Visit: Payer: BC Managed Care – PPO

## 2020-08-24 DIAGNOSIS — Z20822 Contact with and (suspected) exposure to covid-19: Secondary | ICD-10-CM

## 2020-08-24 NOTE — Telephone Encounter (Signed)
Called her back- she had some mild sx of illness and got concerned.  Rapid test this am was negative, PCR is pending She feel ok now, she will let me know if any other concerns

## 2020-08-24 NOTE — Telephone Encounter (Signed)
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 Per patient children are sick with abdominal pain and she is experiencing a sore thoart. Patient wonders if she should get tested for covid 19. Patient offered a virtual visit tomorrow with provider . Patient declined at this time.Patient would like provider to instructed her on what to do.   Please Advise

## 2020-08-25 ENCOUNTER — Encounter: Payer: Self-pay | Admitting: Family Medicine

## 2020-08-25 LAB — NOVEL CORONAVIRUS, NAA: SARS-CoV-2, NAA: NOT DETECTED

## 2020-08-25 LAB — SARS-COV-2, NAA 2 DAY TAT

## 2020-08-29 ENCOUNTER — Encounter: Payer: Self-pay | Admitting: Family Medicine

## 2020-08-30 ENCOUNTER — Encounter: Payer: Self-pay | Admitting: Podiatry

## 2020-09-05 ENCOUNTER — Encounter: Payer: Self-pay | Admitting: Podiatry

## 2020-09-05 NOTE — Progress Notes (Signed)
Received notification that [facility] needed x-ray report. I have faxed clinical notes again which includes the x-ray report for them

## 2020-09-19 ENCOUNTER — Encounter: Payer: BC Managed Care – PPO | Admitting: Orthotics

## 2020-09-20 ENCOUNTER — Other Ambulatory Visit: Payer: Self-pay

## 2020-09-20 ENCOUNTER — Encounter: Payer: BC Managed Care – PPO | Admitting: Orthotics

## 2020-10-30 ENCOUNTER — Encounter: Payer: Self-pay | Admitting: Family Medicine

## 2020-10-31 ENCOUNTER — Ambulatory Visit (INDEPENDENT_AMBULATORY_CARE_PROVIDER_SITE_OTHER): Payer: BC Managed Care – PPO | Admitting: Family Medicine

## 2020-10-31 ENCOUNTER — Ambulatory Visit (HOSPITAL_BASED_OUTPATIENT_CLINIC_OR_DEPARTMENT_OTHER)
Admission: RE | Admit: 2020-10-31 | Discharge: 2020-10-31 | Disposition: A | Discharge status: Home / Self Care | Payer: BC Managed Care – PPO | Source: Ambulatory Visit | Attending: Family Medicine | Admitting: Family Medicine

## 2020-10-31 ENCOUNTER — Other Ambulatory Visit: Payer: Self-pay

## 2020-10-31 ENCOUNTER — Encounter: Payer: Self-pay | Admitting: Family Medicine

## 2020-10-31 VITALS — BP 122/80 | HR 93 | Resp 17 | Ht 65.0 in | Wt 213.0 lb

## 2020-10-31 DIAGNOSIS — W540XXA Bitten by dog, initial encounter: Secondary | ICD-10-CM | POA: Insufficient documentation

## 2020-10-31 DIAGNOSIS — S61451A Open bite of right hand, initial encounter: Secondary | ICD-10-CM

## 2020-10-31 MED ORDER — MOXIFLOXACIN HCL 400 MG PO TABS: 400.0000 mg | ORAL_TABLET | Freq: Every day | ORAL | 0 refills | Status: DC

## 2020-10-31 NOTE — Patient Instructions (Signed)
I am sorry you got bitten!  We will have you use antibiotics for one week and get an x-ray of your hand (to make sure no sign of a fracture) Please confirm for yourself with the vet that your father's dog is up to date on rabies.  If any question I will report to animal control for you.  In any case the animal should be observed for 10 days- if any sign of illness during the time consult with me and the vet right away

## 2020-10-31 NOTE — Progress Notes (Addendum)
[facility] [address], [address] [place], [place] [address] [phone]  Date:  10/31/2020   Name:  Patricia Morse   DOB:  05-May-1986   MRN:  [id number]  PCP:  Pearline Cables, MD    Chief Complaint: No chief complaint on file.   History of Present Illness:  Patricia Morse is a 34 y.o. very pleasant female patient who presents with the following:  Pt had contacted me about a dog bite She got bitten on her right hand last night by her father large mixed breed dog Her tetanus is UTD This dog has bitten her in the past.  The patient reports she was sitting on the couch, and reached towards the dog at which time he bit her  She has several breaks in the skin of her right hand, and tenderness at the PIP joint of the long finger   She otherwise feels well  Patient Active Problem List   Diagnosis Date Noted  . Depression with anxiety 03/17/2020  . Paresthesia 03/17/2020  . Plantar fasciitis of left foot 09/21/2019  . Postoperative observation 11/12/2018  . Visual disturbance 04/25/2016  . Dizziness 04/25/2016  . Obesity, unspecified 06/08/2013  . Depression 10/10/2012  . Hearing loss 10/10/2012    Past Medical History:  Diagnosis Date  . Allergy   . Auditory neuropathy BILATERAL  . Bilateral sensorineural hearing loss    SECONDARY AUDITORY NEUROPATHY  . 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 Depression   . 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 Internal derangement of right knee   . PONV (postoperative nausea and vomiting) POSTOP COCHLEAR  IMPLANT 2006  . Seasonal allergies     Past Surgical History:  Procedure Laterality Date  . BILATERAL MIDDLE EAR EXPLORATION/ SEALING OF OVAL AND ROUND WINDOW FISTULAS  1999 (AGE 31)   PERILYMPH FISTULAS  . CHONDROPLASTY  03/13/2012   Procedure: CHONDROPLASTY;  Surgeon: Shelda Pal, MD;  Location: [facility];  Service: Orthopedics;  Laterality: Right;  . COCHLEAR IMPLANT  11-15-2005  (AGE 33)   LEFT EAR  (CLARION HIGH-RESOLUTION 90K MULTICHANNEL,  1-J ELECTRODE)  . COCHLEAR IMPLANT Right 11/12/2018   Procedure: COCHLEAR IMPLANT RIGHT EAR;  Surgeon: Ermalinda Barrios, MD;  Location: [facility];  Service: ENT;  Laterality: Right;  . KNEE ARTHROSCOPY  03/13/2012   Procedure: ARTHROSCOPY KNEE;  Surgeon: Shelda Pal, MD;  Location: [facility];  Service: Orthopedics;  Laterality: Right;  RIGHT KNEE DIAGNOSTIC AND OPERATIVE SCOPE  . WISDOM TOOTH EXTRACTION  AGE 69   DENTAL OFFICE    Social History   Tobacco Use  . Smoking status: Former Smoker    Types: Cigarettes    Quit date: 03/10/2008    Years since quitting: 12.6  . Smokeless tobacco: Never Used  . Tobacco comment: SOCIAL SMOKER IN COLLEGE FOR 69YRS -- NO SMOKING SINCE  Vaping Use  . Vaping Use: Never used  Substance Use Topics  . Alcohol use: Yes    Alcohol/week: 3.0 - 5.0 standard drinks    Types: 3 - 5 Glasses of wine per week    Comment: OCCASIONAL, "social"   . Drug use: No    Family History  Problem Relation Age of Onset  . Depression Mother   . Hypertension Mother   . Mental illness Mother   .  Anxiety disorder Maternal Grandmother     Allergies  Allergen Reactions  . Contrast Media [Iodinated Diagnostic Agents] Itching    Swelling, high heart rate  . Amoxicillin Hives  . Tdap [Tetanus-Diphth-Acell Pertussis]   . Hydrocodeine [Dihydrocodeine] Rash  . Penicillins Rash    Medication list has been reviewed and updated.  Current Outpatient Medications on File Prior to Visit  Medication Sig Dispense Refill  . busPIRone (BUSPAR) 15 MG tablet     . busPIRone (BUSPAR) 30 MG tablet Take 30 mg by mouth daily.    . cetirizine (ZYRTEC) 10 MG tablet Take 10 mg by mouth daily.    Marland Kitchen desvenlafaxine (PRISTIQ) 100 MG 24 hr tablet 50 mg.     . hydrOXYzine (VISTARIL) 25 MG capsule Take 1  capsule (25 mg total) by mouth 3 (three) times daily as needed. 30 capsule 0  . lidocaine (XYLOCAINE) 5 % ointment Apply topically.    . meloxicam (MOBIC) 7.5 MG tablet Take 7.5 mg by mouth daily.    . methocarbamol (ROBAXIN) 500 MG tablet Take 1 tablet (500 mg total) by mouth every 8 (eight) hours as needed for muscle spasms. 15 tablet 0  . Probiotic Product (PROBIOTIC DAILY PO) Take by mouth.    . sertraline (ZOLOFT) 50 MG tablet Take 50 mg by mouth daily.    Marland Kitchen triamcinolone (KENALOG) 0.1 % paste Apply to canker sore twice a day as needed 5 g 2  . valACYclovir (VALTREX) 1000 MG tablet TAKE 2 TABLETS TWICE A DAY FOR 2 DOSES FOR COLD SORES 30 tablet 7   No current facility-administered medications on file prior to visit.    Review of Systems:  As per HPI- otherwise negative.   Physical Examination: There were no vitals filed for this visit. There were no vitals filed for this visit. There is no height or weight on file to calculate BMI. Ideal Body Weight:    GEN: No acute distress; alert,appropriate. PULM: Breathing comfortably in no respiratory distress PSYCH: Normally interactive.  Right hand displays several small tooth marks in the index, ring finger.  No significant joint swelling or bruising is noted.  The patient does have some tenderness when squeezing over the PIP joint of the long finger She has an approximately 1 cm linear laceration on the distal long finger which is line closed, otherwise she has small breaks in the skin  Assessment and Plan: Dog bite of right hand, initial encounter - Plan: moxifloxacin (AVELOX) 400 MG tablet, DG Hand Complete Right  Patient today following a dog bite of her right hand We will treat her with moxifloxacin for 7 days due to penicillin allergy.  Tetanus is up-to-date.  Will obtain a plain film of her hand today as well to rule out any fracture associated with bite injury  Discussed rabies exposure in detail with patient.  She declines to  have me submit a bite report to animal control at this time, she is concerned that the dog will be impounded and cause significant family strife.  I advised her to please confirm for herself that the dog's rabies is up-to-date by contacting their veterinarian.  If she is not able to confirm this, or if the dog cannot be observed at home for a week to 10 days she will contact me and I will submit a bite report I also advised her that this dog may bite again and she should avoid further contact with the animal  Signed Abbe Amsterdam, MD  Addendum 12/7,  received hand x-ray as below- message to pt  DG Hand Complete Right  Result Date: 10/31/2020 CLINICAL DATA:  Status post dog bite. EXAM: RIGHT HAND - COMPLETE 3+ VIEW COMPARISON:  None. FINDINGS: There is no evidence of fracture or dislocation. There is no evidence of arthropathy or other focal bone abnormality. Mild soft tissue swelling is seen adjacent to the PIP joint of the third right finger. IMPRESSION: Mild soft tissue swelling of the third right finger, without evidence of an acute osseous abnormality. Electronically Signed   By: Aram Candela M.D.   On: 10/31/2020 20:24

## 2020-11-01 ENCOUNTER — Encounter: Payer: Self-pay | Admitting: Family Medicine

## 2020-11-09 ENCOUNTER — Ambulatory Visit: Payer: BC Managed Care – PPO | Admitting: Family Medicine

## 2020-12-02 ENCOUNTER — Encounter: Payer: Self-pay | Admitting: Family Medicine

## 2020-12-08 ENCOUNTER — Other Ambulatory Visit: Payer: BC Managed Care – PPO

## 2020-12-08 DIAGNOSIS — Z20822 Contact with and (suspected) exposure to covid-19: Secondary | ICD-10-CM

## 2020-12-09 IMAGING — RF DG C-ARM 61-120 MIN
1 series · 1 of 1 positions shown · non-contrast
Comparison: None.

CLINICAL DATA: Cochlear implant

EXAM:
SELLA TURCICA; DG C-ARM 61-120 MIN

[Series 1: run · 1 of 1 slices shown]
[im 1/1]
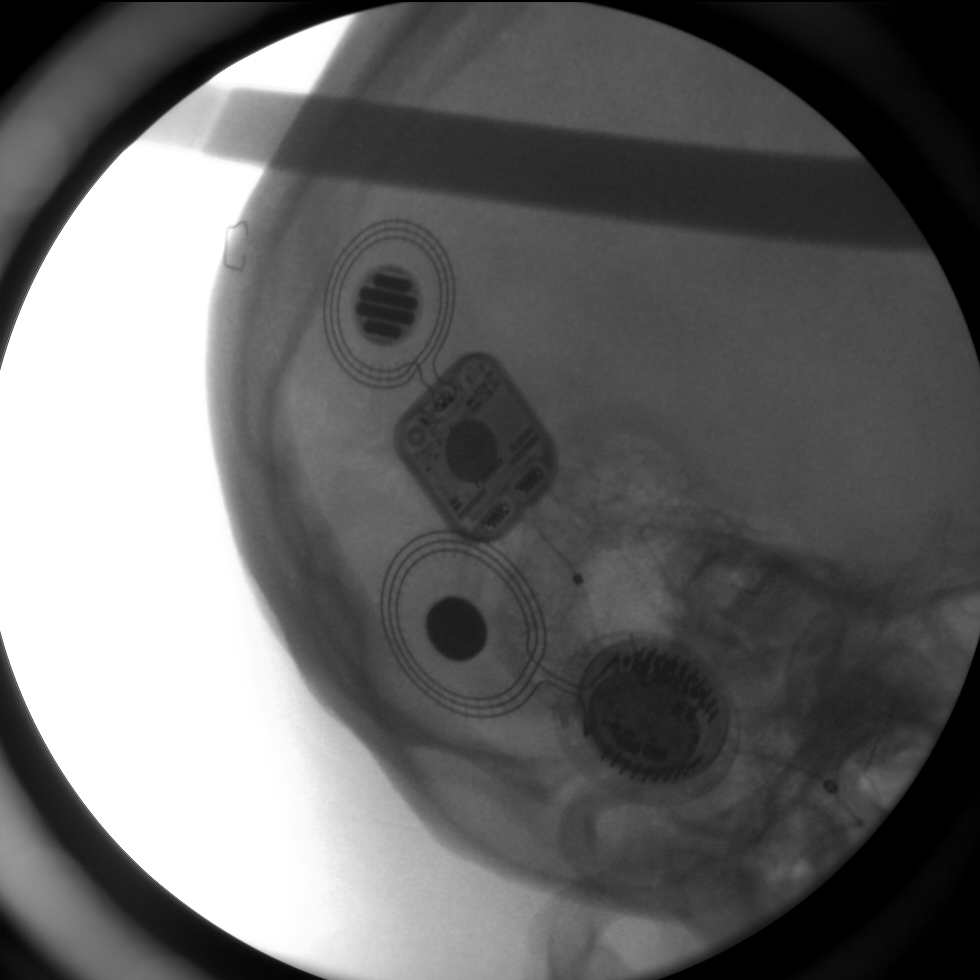

[1 of 1 positions shown; findings below may reference images not displayed]

FINDINGS: C-arm radiograph demonstrates BILATERAL cochlear implants.
IMPRESSION: BILATERAL cochlear implant devices. Positioning is not established
with this lateral radiograph. The RIGHT cochlear implant was
implanted on today's radiograph.

## 2020-12-10 LAB — SARS-COV-2, NAA 2 DAY TAT

## 2020-12-10 LAB — NOVEL CORONAVIRUS, NAA: SARS-CoV-2, NAA: DETECTED — AB

## 2020-12-15 ENCOUNTER — Ambulatory Visit (INDEPENDENT_AMBULATORY_CARE_PROVIDER_SITE_OTHER): Payer: BC Managed Care – PPO | Admitting: Family Medicine

## 2020-12-15 DIAGNOSIS — Z5329 Procedure and treatment not carried out because of patient's decision for other reasons: Secondary | ICD-10-CM

## 2020-12-15 NOTE — Progress Notes (Signed)
No show

## 2020-12-20 NOTE — Progress Notes (Signed)
[facility] at [facility] [address] [place], [place] [address] [phone]  Date:  12/21/2020   Name:  Patricia Morse   DOB:  October 16, 1986   MRN:  [id number]  PCP:  Pearline Cables, MD    Chief Complaint: Chest Pain (Pt states chest pain post COVID. Pt states having some improvement but states it hurts to take a deep breathe and feels a pull)   History of Present Illness:  Patricia Morse is a 35 y.o. very pleasant female patient who presents with the following:  Pt here today with concern of pain in her chest after covid 19 infection  She tested positive for COVID-19 on 12/08/20; symptoms included headache, sneezing, runny nose- just felt somewhat off  She has noted a pain in her chest when she breathes in- she feels "a catch" if she takes a very deep breath.  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 Her husband did not seem to get Covid this time   Patient Active Problem List   Diagnosis Date Noted   Depression with anxiety 03/17/2020   Paresthesia 03/17/2020   Plantar fasciitis of left foot 09/21/2019   Postoperative observation 11/12/2018   Visual disturbance 04/25/2016   Dizziness 04/25/2016   Obesity, unspecified 06/08/2013   Depression 10/10/2012   Hearing loss 10/10/2012    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   RESIDUAL ABSENCE OF KNEE REFLEX   Internal derangement of right knee    PONV (postoperative nausea and vomiting)  POSTOP COCHLEAR  IMPLANT 2006   Seasonal allergies     Past Surgical History:  Procedure Laterality Date   BILATERAL MIDDLE EAR EXPLORATION/ SEALING OF OVAL AND ROUND WINDOW FISTULAS  1999 (AGE 32)   PERILYMPH FISTULAS   CHONDROPLASTY  03/13/2012   Procedure: CHONDROPLASTY;  Surgeon: Shelda Pal, MD;  Location: [facility] [place];  Service: Orthopedics;  Laterality: Right;   COCHLEAR IMPLANT  11-15-2005  (AGE 56)   LEFT EAR  (CLARION HIGH-RESOLUTION 90K MULTICHANNEL,  1-J ELECTRODE)   COCHLEAR IMPLANT Right 11/12/2018   Procedure: COCHLEAR IMPLANT RIGHT EAR;  Surgeon: Ermalinda Barrios, MD;  Location: [facility];  Service: ENT;  Laterality: Right;   KNEE ARTHROSCOPY  03/13/2012   Procedure: ARTHROSCOPY KNEE;  Surgeon: Shelda Pal, MD;  Location: [facility];  Service: Orthopedics;  Laterality: Right;  RIGHT KNEE DIAGNOSTIC AND OPERATIVE SCOPE   WISDOM TOOTH EXTRACTION  AGE 6   DENTAL OFFICE    Social History   Tobacco Use   Smoking status: Former Smoker    Types: Cigarettes    Quit date: 03/10/2008    Years since quitting: 12.7   Smokeless tobacco: Never Used   Tobacco comment: SOCIAL SMOKER IN COLLEGE FOR 72YRS -- NO SMOKING SINCE  Vaping Use   Vaping Use: Never used  Substance Use Topics   Alcohol use: Yes  Alcohol/week: 3.0 - 5.0 standard drinks    Types: 3 - 5 Glasses of wine per week    Comment: OCCASIONAL, "social"    Drug use: No    Family History  Problem Relation Age of Onset   Depression Mother    Hypertension Mother    Mental illness Mother    Anxiety disorder Maternal Grandmother     Allergies  Allergen Reactions   Contrast Media [Iodinated Diagnostic Agents] Itching    Swelling, high heart rate   Amoxicillin Hives   Tdap [Tetanus-Diphth-Acell Pertussis]    Hydrocodeine [Dihydrocodeine] Rash   Penicillins Rash    Medication list has been reviewed and updated.  Current Outpatient  Medications on File Prior to Visit  Medication Sig Dispense Refill   cetirizine (ZYRTEC) 10 MG tablet Take 10 mg by mouth daily.     hydrOXYzine (VISTARIL) 25 MG capsule Take 1 capsule (25 mg total) by mouth 3 (three) times daily as needed. 30 capsule 0   lidocaine (XYLOCAINE) 5 % ointment Apply topically.     meloxicam (MOBIC) 7.5 MG tablet Take 7.5 mg by mouth daily.     methocarbamol (ROBAXIN) 500 MG tablet Take 1 tablet (500 mg total) by mouth every 8 (eight) hours as needed for muscle spasms. 15 tablet 0   moxifloxacin (AVELOX) 400 MG tablet Take 1 tablet (400 mg total) by mouth daily. 7 tablet 0   Probiotic Product (PROBIOTIC DAILY PO) Take by mouth.     sertraline (ZOLOFT) 50 MG tablet Take 200 mg by mouth daily.      triamcinolone (KENALOG) 0.1 % paste Apply to canker sore twice a day as needed 5 g 2   valACYclovir (VALTREX) 1000 MG tablet TAKE 2 TABLETS TWICE A DAY FOR 2 DOSES FOR COLD SORES 30 tablet 7   No current facility-administered medications on file prior to visit.    Review of Systems:  As per HPI- otherwise negative.   Physical Examination: Vitals:   12/21/20 1510  BP: 116/70  Pulse: 94  Resp: 20  Temp: 98.1 F (36.7 C)  SpO2: 98%   Vitals:   12/21/20 1510  Weight: 210 lb 9.6 oz (95.5 kg)  Height: 5\' 5"  (1.651 m)   Body mass index is 35.05 kg/m. Ideal Body Weight: Weight in (lb) to have BMI = 25: 149.9  GEN: no acute distress.  Obese, looks well  Hard of hearing, uses bilateral cochlear implant HEENT: Atraumatic, Normocephalic.  Ears and Nose: No external deformity. CV: RRR, No M/G/R. No JVD. No thrill. No extra heart sounds. PULM: CTA B, no wheezes, crackles, rhonchi. No retractions. No resp. distress. No accessory muscle use. ABD: S, NT, ND, +BS. No rebound. No HSM. EXTR: No c/c/e PSYCH: Normally interactive. Conversant.   EKG: NSR, rsr' in V1 Compared with EKG 01/06/20 no change noted  Assessment and Plan: Chest pain, unspecified  type - Plan: EKG 12-Lead   Patient today with concern of chest discomfort after recent COVID-19 infection.  She notes a sharp pain in her chest when she takes a deep breath in, otherwise she feels well.  EKG today is reassuring.  I offered a chest film, she denies for the time being.  She feels reassured, will let me know if her symptoms do not continue to improve   This visit occurred during the SARS-CoV-2 public health emergency.  Safety protocols were in place, including screening questions prior to the visit, additional usage of staff PPE, and extensive cleaning of exam room  while observing appropriate contact time as indicated for disinfecting solutions.    Signed Abbe Amsterdam, MD

## 2020-12-20 NOTE — Patient Instructions (Incomplete)
Good to see you again today!   Your EKG looks ok today I think that you are on the right track- if you continue to gradually get better great- however if you stop improving or have any other concerns please let me know.  We can get a chest x-ray for you later on if you like

## 2020-12-21 ENCOUNTER — Other Ambulatory Visit: Payer: Self-pay

## 2020-12-21 ENCOUNTER — Encounter: Payer: Self-pay | Admitting: Family Medicine

## 2020-12-21 ENCOUNTER — Ambulatory Visit (INDEPENDENT_AMBULATORY_CARE_PROVIDER_SITE_OTHER): Payer: BC Managed Care – PPO | Admitting: Family Medicine

## 2020-12-21 VITALS — BP 116/70 | HR 94 | Temp 98.1°F | Resp 20 | Ht 65.0 in | Wt 210.6 lb

## 2020-12-21 DIAGNOSIS — R079 Chest pain, unspecified: Secondary | ICD-10-CM | POA: Diagnosis not present

## 2021-01-11 ENCOUNTER — Encounter: Payer: Self-pay | Admitting: Family Medicine

## 2021-01-19 ENCOUNTER — Encounter: Payer: Self-pay | Admitting: Family Medicine

## 2021-01-20 MED ORDER — VALACYCLOVIR HCL 1 G PO TABS: 500.0000 mg | ORAL_TABLET | Freq: Every day | ORAL | 1 refills | Status: DC

## 2021-01-25 ENCOUNTER — Encounter: Payer: Self-pay | Admitting: Family Medicine

## 2021-01-30 NOTE — Progress Notes (Addendum)
[facility] at [facility] [address] [place], [place] [address] [phone]  Date:  02/01/2021   Name:  Patricia Morse   DOB:  1986/01/09   MRN:  [id number]  PCP:  Pearline Cables, MD    Chief Complaint: Chest Cold (Covid hx in 11/2020, recurrent chest colds, chest pain when breathing in radiating to back, and negative chest xray would like retest/) and Mouth Lesions (Cold sores, taking valtrex daily-helping )   History of Present Illness:  Patricia Morse is a 35 y.o. very pleasant female patient who presents with the following:  Patient here today with concern of illness-last seen by myself January 26 At that time she had recently had COVID-19: She tested positive for COVID-19 on 12/08/20; symptoms included headache, sneezing, runny nose- just felt somewhat off  She has noted a pain in her chest when she breathes in- she feels "a catch" if she takes a very deep breath.  She does not get CP unless she takes a deep breath  She just wanted to make sure she is well-she does feel like she is generally getting better Not coughing  She has not noted any fever, no body aches  She notes that he BP has been high on a few occasions such as around the time of operations She is able to check her blood pressure at home and can monitor it for [place]  She had COVID-19 in January, has struggled with recurrent URI symptoms since that time Pt notes she has had "a cold and I keep getting sick, and I am getting cold sores" Taking daily valtrex does seem to be helping her   She notes that she had chest pain when she was ill with covid This is much better now  However, she will get a stabbing pain in her upper back when she takes a deep breath.  This worries her some She has not had a chest x-ray or D-dimer this year We did do an EKG in January which was normal  Patient Active Problem List   Diagnosis Date Noted  . Depression with anxiety  03/17/2020  . Paresthesia 03/17/2020  . Plantar fasciitis of left foot 09/21/2019  . Postoperative observation 11/12/2018  . Visual disturbance 04/25/2016  . Dizziness 04/25/2016  . Obesity, unspecified 06/08/2013  . Depression 10/10/2012  . Hearing loss 10/10/2012    Past Medical History:  Diagnosis Date  . Allergy   . Auditory neuropathy BILATERAL  . Bilateral sensorineural hearing loss    SECONDARY AUDITORY NEUROPATHY  . 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 Depression   . 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 Internal derangement of right knee   . PONV (postoperative nausea and vomiting) POSTOP COCHLEAR  IMPLANT 2006  . Seasonal allergies     Past Surgical History:  Procedure Laterality Date  . BILATERAL MIDDLE EAR EXPLORATION/ SEALING OF OVAL AND ROUND WINDOW FISTULAS  1999 (AGE 48)   PERILYMPH FISTULAS  . CHONDROPLASTY  03/13/2012   Procedure: CHONDROPLASTY;  Surgeon: Shelda Pal, MD;  Location: [facility];  Service: Orthopedics;  Laterality: Right;  . COCHLEAR IMPLANT  11-15-2005  (AGE 79)   LEFT EAR  (CLARION HIGH-RESOLUTION 90K MULTICHANNEL,  1-J ELECTRODE)  . COCHLEAR IMPLANT Right 11/12/2018   Procedure: COCHLEAR IMPLANT RIGHT EAR;  Surgeon: Ermalinda Barrios, MD;  Location:  [facility];  Service: ENT;  Laterality: Right;  . KNEE ARTHROSCOPY  03/13/2012   Procedure: ARTHROSCOPY KNEE;  Surgeon: Shelda Pal, MD;  Location: [facility];  Service: Orthopedics;  Laterality: Right;  RIGHT KNEE DIAGNOSTIC AND OPERATIVE SCOPE  . WISDOM TOOTH EXTRACTION  AGE 57   DENTAL OFFICE    Social History   Tobacco Use  . Smoking status: Former Smoker    Types: Cigarettes    Quit date: 03/10/2008    Years since quitting: 12.9  . Smokeless tobacco: Never Used  . Tobacco comment: SOCIAL SMOKER IN COLLEGE FOR 41YRS -- NO SMOKING SINCE  Vaping Use  . Vaping Use: Never used  Substance  Use Topics  . Alcohol use: Yes    Alcohol/week: 3.0 - 5.0 standard drinks    Types: 3 - 5 Glasses of wine per week    Comment: OCCASIONAL, "social"   . Drug use: No    Family History  Problem Relation Age of Onset  . Depression Mother   . Hypertension Mother   . Mental illness Mother   . Anxiety disorder Maternal Grandmother     Allergies  Allergen Reactions  . Contrast Media [Iodinated Diagnostic Agents] Itching    Swelling, high heart rate  . Amoxicillin Hives  . Tdap [Tetanus-Diphth-Acell Pertussis]   . Hydrocodeine [Dihydrocodeine] Rash  . Penicillins Rash    Medication list has been reviewed and updated.  Current Outpatient Medications on File Prior to Visit  Medication Sig Dispense Refill  . cetirizine (ZYRTEC) 10 MG tablet Take 10 mg by mouth daily.    . hydrOXYzine (VISTARIL) 25 MG capsule Take 1 capsule (25 mg total) by mouth 3 (three) times daily as needed. 30 capsule 0  . lidocaine (XYLOCAINE) 5 % ointment Apply topically.    . meloxicam (MOBIC) 7.5 MG tablet Take 7.5 mg by mouth daily.    . methocarbamol (ROBAXIN) 500 MG tablet Take 1 tablet (500 mg total) by mouth every 8 (eight) hours as needed for muscle spasms. 15 tablet 0  . moxifloxacin (AVELOX) 400 MG tablet Take 1 tablet (400 mg total) by mouth daily. 7 tablet 0  . Probiotic Product (PROBIOTIC DAILY PO) Take by mouth.    . sertraline (ZOLOFT) 50 MG tablet Take 200 mg by mouth daily.     Marland Kitchen triamcinolone (KENALOG) 0.1 % paste Apply to canker sore twice a day as needed 5 g 2  . valACYclovir (VALTREX) 1000 MG tablet Take 0.5-1 tablets (500-1,000 mg total) by mouth daily. Use as needed for suppression 90 tablet 1   No current facility-administered medications on file prior to visit.    Review of Systems:  As per HPI- otherwise negative.   Physical Examination: Vitals:   02/01/21 1510  BP: (!) 136/98  Pulse: 85  Resp: 18  Temp: 98 F (36.7 C)  SpO2: 97%   Vitals:   02/01/21 1510  Weight: 212  lb (96.2 kg)  Height: 5\' 5"  (1.651 m)   Body mass index is 35.28 kg/m. Ideal Body Weight: Weight in (lb) to have BMI = 25: 149.9  Recheck blood pressure, no change GEN: no acute distress.  Obese, uses cochlear implant for deafness HEENT: Atraumatic, Normocephalic. Oropharynx wnl There is a healing cold sore on her lower lip Ears and Nose: No external deformity. CV: RRR, No M/G/R. No JVD. No thrill. No extra heart sounds. PULM: CTA B, no wheezes, crackles, rhonchi. No retractions. No resp. distress. No accessory muscle  use. ABD: S, NT, ND, +BS. No rebound. No HSM. EXTR: No c/c/e PSYCH: Normally interactive. Conversant.   Results for orders placed or performed in visit on 02/01/21  POCT urine pregnancy  Result Value Ref Range   Preg Test, Ur Negative Negative    BP Readings from Last 3 Encounters:  02/01/21 (!) 136/98  12/21/20 116/70  10/31/20 122/80    Assessment and Plan: Fatigue, unspecified type - Plan: VITAMIN D 25 Hydroxy (Vit-D Deficiency, Fractures), B12 and Folate Panel, Ferritin, POCT urine pregnancy, TSH  Chest tightness - Plan: DG Chest 2 View, Basic metabolic panel, CBC, D-Dimer, Quantitative  Cold sore  Elevated blood pressure reading  Patient today with a couple of concerns.  She notes that since she had COVID-19 in January she has been sick frequently, has been having a lot of cold sores.  She is using Valtrex now daily for suppression which is helping She has also felt more fatigued.  We will check some lab work as above to look for any source.  We also decided to do a chest x-ray and D-dimer to further evaluate her concern of pain in her back with deep breath.  She understands if D-dimer is positive we will need to consider CT scan  Blood pressure is elevated today which is not typical for patient.  We discussed this in detail.  She is able to check her blood pressures at home, she will check this every couple of days for 2 weeks and send me a message with  readings.  Discussed blood pressure goal of less than 130/85 If she continues have elevated blood pressures we can consider medication for same  This visit occurred during the SARS-CoV-2 public health emergency.  Safety protocols were in place, including screening questions prior to the visit, additional usage of staff PPE, and extensive cleaning of exam room while observing appropriate contact time as indicated for disinfecting solutions.    Signed Abbe Amsterdam, MD  DG Chest 2 View  Result Date: 02/01/2021 CLINICAL DATA:  Chest pain with breathing. Recurrent chest colds. History of COVID-19 in January, 2022. EXAM: CHEST - 2 VIEW COMPARISON:  PA and lateral chest 01/06/2020. FINDINGS: The lungs are clear. Heart size is normal. No pneumothorax or pleural fluid. No acute or focal bony abnormality. IMPRESSION: Negative chest. Electronically Signed   By: Drusilla Kanner M.D.   On: 02/01/2021 16:00   Received her chest x-ray as above, message to patient  Addendum 3/10, received labs as below.  Message to patient  Results for orders placed or performed in visit on 02/01/21  VITAMIN D 25 Hydroxy (Vit-D Deficiency, Fractures)  Result Value Ref Range   VITD 35.83 30.00 - 100.00 ng/mL  B12 and Folate Panel  Result Value Ref Range   Vitamin B-12 428 211 - 911 pg/mL   Folate >23.6 >5.9 ng/mL  Ferritin  Result Value Ref Range   Ferritin 42.8 10.0 - 291.0 ng/mL  Basic metabolic panel  Result Value Ref Range   Sodium 139 135 - 145 mEq/L   Potassium 4.0 3.5 - 5.1 mEq/L   Chloride 104 96 - 112 mEq/L   CO2 26 19 - 32 mEq/L   Glucose, Bld 80 70 - 99 mg/dL   BUN 16 6 - 23 mg/dL   Creatinine, Ser 0.94 0.40 - 1.20 mg/dL   GFR 709.62 >83.66 mL/min   Calcium 9.4 8.4 - 10.5 mg/dL  CBC  Result Value Ref Range   WBC 6.7 4.0 - 10.5 K/uL  RBC 4.31 3.87 - 5.11 Mil/uL   Platelets 217.0 150.0 - 400.0 K/uL   Hemoglobin 12.9 12.0 - 15.0 g/dL   HCT 09.836.5 11.936.0 - 14.746.0 %   MCV 84.6 78.0 - 100.0 fl   MCHC  35.4 30.0 - 36.0 g/dL   RDW 82.913.4 56.211.5 - 13.015.5 %  TSH  Result Value Ref Range   TSH 1.96 0.35 - 4.50 uIU/mL  D-Dimer, Quantitative  Result Value Ref Range   D-Dimer, Quant 0.31 <0.50 mcg/mL FEU  POCT urine pregnancy  Result Value Ref Range   Preg Test, Ur Negative Negative

## 2021-02-01 ENCOUNTER — Encounter: Payer: Self-pay | Admitting: Family Medicine

## 2021-02-01 ENCOUNTER — Ambulatory Visit (HOSPITAL_BASED_OUTPATIENT_CLINIC_OR_DEPARTMENT_OTHER)
Admission: RE | Admit: 2021-02-01 | Discharge: 2021-02-01 | Disposition: A | Discharge status: Home / Self Care | Payer: BC Managed Care – PPO | Source: Ambulatory Visit | Attending: Family Medicine | Admitting: Family Medicine

## 2021-02-01 ENCOUNTER — Ambulatory Visit (INDEPENDENT_AMBULATORY_CARE_PROVIDER_SITE_OTHER): Payer: BC Managed Care – PPO | Admitting: Family Medicine

## 2021-02-01 ENCOUNTER — Other Ambulatory Visit: Payer: Self-pay

## 2021-02-01 VITALS — BP 136/98 | HR 85 | Temp 98.0°F | Resp 18 | Ht 65.0 in | Wt 212.0 lb

## 2021-02-01 DIAGNOSIS — R5383 Other fatigue: Secondary | ICD-10-CM

## 2021-02-01 DIAGNOSIS — R0789 Other chest pain: Secondary | ICD-10-CM | POA: Diagnosis present

## 2021-02-01 DIAGNOSIS — R030 Elevated blood-pressure reading, without diagnosis of hypertension: Secondary | ICD-10-CM

## 2021-02-01 DIAGNOSIS — B001 Herpesviral vesicular dermatitis: Secondary | ICD-10-CM

## 2021-02-01 LAB — POCT URINE PREGNANCY: Preg Test, Ur: NEGATIVE

## 2021-02-01 NOTE — Patient Instructions (Addendum)
Good to see you again today!  Please stop by the lab and then to the ground floor to have your chest x-ray. We will check your D dimer to make sure it is still negative- if positive we will do a chest CT to look for clot.  If negative we can feel confident that you do not have a blood clot We will look for any other cause of your fatigue as well

## 2021-02-02 ENCOUNTER — Encounter: Payer: Self-pay | Admitting: Family Medicine

## 2021-02-02 LAB — CBC
HCT: 36.5 % (ref 36.0–46.0)
Hemoglobin: 12.9 g/dL (ref 12.0–15.0)
MCHC: 35.4 g/dL (ref 30.0–36.0)
MCV: 84.6 fl (ref 78.0–100.0)
Platelets: 217 10*3/uL (ref 150.0–400.0)
RBC: 4.31 Mil/uL (ref 3.87–5.11)
RDW: 13.4 % (ref 11.5–15.5)
WBC: 6.7 10*3/uL (ref 4.0–10.5)

## 2021-02-02 LAB — BASIC METABOLIC PANEL
BUN: 16 mg/dL (ref 6–23)
CO2: 26 mEq/L (ref 19–32)
Calcium: 9.4 mg/dL (ref 8.4–10.5)
Chloride: 104 mEq/L (ref 96–112)
Creatinine, Ser: 0.73 mg/dL (ref 0.40–1.20)
GFR: 108.25 mL/min (ref >60.00)
Glucose, Bld: 80 mg/dL (ref 70–99)
Potassium: 4 mEq/L (ref 3.5–5.1)
Sodium: 139 mEq/L (ref 135–145)

## 2021-02-02 LAB — B12 AND FOLATE PANEL
Folate: >23.6 ng/mL (ref >5.9)
Vitamin B-12: 428 pg/mL (ref 211–911)

## 2021-02-02 LAB — VITAMIN D 25 HYDROXY (VIT D DEFICIENCY, FRACTURES): VITD: 35.83 ng/mL (ref 30.00–100.00)

## 2021-02-02 LAB — FERRITIN: Ferritin: 42.8 ng/mL (ref 10.0–291.0)

## 2021-02-02 LAB — D-DIMER, QUANTITATIVE: D-Dimer, Quant: 0.31 mcg/mL FEU (ref <0.50)

## 2021-02-02 LAB — TSH: TSH: 1.96 u[IU]/mL (ref 0.35–4.50)

## 2021-02-09 ENCOUNTER — Encounter: Payer: Self-pay | Admitting: Family Medicine

## 2021-02-27 ENCOUNTER — Encounter: Payer: Self-pay | Admitting: Family Medicine

## 2021-02-27 DIAGNOSIS — F418 Other specified anxiety disorders: Secondary | ICD-10-CM

## 2021-02-27 MED ORDER — SERTRALINE HCL 100 MG PO TABS
200.0000 mg | ORAL_TABLET | Freq: Every day | ORAL | 4 refills | Status: DC
Start: 2021-02-27 — End: 2021-03-12

## 2021-03-12 MED ORDER — SERTRALINE HCL 100 MG PO TABS: 200.0000 mg | ORAL_TABLET | Freq: Every day | ORAL | 0 refills | Status: DC

## 2021-03-12 NOTE — Addendum Note (Signed)
Addended by: Abbe Amsterdam C on: 03/12/2021 08:50 PM   Modules accepted: Orders

## 2021-03-21 ENCOUNTER — Encounter: Payer: Self-pay | Admitting: Family Medicine

## 2021-04-09 ENCOUNTER — Other Ambulatory Visit: Payer: Self-pay | Admitting: Family Medicine

## 2021-04-09 DIAGNOSIS — F418 Other specified anxiety disorders: Secondary | ICD-10-CM

## 2021-05-14 ENCOUNTER — Encounter: Payer: Self-pay | Admitting: Family Medicine

## 2021-06-16 ENCOUNTER — Telehealth: Payer: Self-pay

## 2021-06-16 MED ORDER — FLUOXETINE HCL 10 MG PO TABS: ORAL_TABLET | ORAL | 0 refills | Status: DC

## 2021-06-16 NOTE — Telephone Encounter (Signed)
Tried to call pt to make her aware of the medication , but phone was sent straight to voicemail , left a voicemail to return call in office . Patient states on earlier call she loss access to [facility] , gave her [employer] number .

## 2021-06-16 NOTE — Addendum Note (Signed)
Addended by: Abbe Amsterdam C on: 06/16/2021 03:07 PM   Modules accepted: Orders

## 2021-06-16 NOTE — Telephone Encounter (Signed)
Patient states she is having withdrawals from zoloft and wants a medication to help with withdrawals , states she seen a commercial about about prozac     Pharmacy :  [facility] [address]

## 2021-07-03 ENCOUNTER — Encounter: Payer: Self-pay | Admitting: Family Medicine

## 2021-07-07 NOTE — Progress Notes (Deleted)
[facility] at [facility] [address] [place], [place] [address] [phone]  Date:  07/10/2021   Name:  Patricia Morse   DOB:  [birth year]   MRN:  [id number]  PCP:  Pearline Cables, MD    Chief Complaint: No chief complaint on file.   History of Present Illness:  Patricia Morse is a 35 y.o. very pleasant female patient who presents with the following:  Patient seen today for physical History of hearing loss with cochlear implant, depression and anxiety Last visit with myself was in March In the interim she conceived, became pregnant in June There was some concern about her SSRI, we tried to taper Zoloft but she had a lot of difficulty.  In the end she decided to continue Zoloft during pregnancy due to risks of stopping this medication Patient Active Problem List   Diagnosis Date Noted   Depression with anxiety 03/17/2020   Paresthesia 03/17/2020   Plantar fasciitis of left foot 09/21/2019   Postoperative observation 11/12/2018   Visual disturbance 04/25/2016   Dizziness 04/25/2016   Obesity, unspecified 06/08/2013   Depression 10/10/2012   Hearing loss 10/10/2012    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   RESIDUAL ABSENCE OF KNEE REFLEX   Internal derangement of right knee    PONV (postoperative nausea and vomiting) POSTOP COCHLEAR  IMPLANT 2006   Seasonal allergies     Past Surgical History:  Procedure Laterality Date   BILATERAL MIDDLE EAR EXPLORATION/ SEALING OF OVAL AND ROUND WINDOW FISTULAS  1999 (AGE 33)   PERILYMPH FISTULAS   CHONDROPLASTY  03/13/2012   Procedure: CHONDROPLASTY;  Surgeon: Shelda Pal, MD;  Location: [facility] ;  Service: Orthopedics;   Laterality: Right;   COCHLEAR IMPLANT  11-15-2005  (AGE 51)   LEFT EAR  (CLARION HIGH-RESOLUTION 90K MULTICHANNEL,  1-J ELECTRODE)   COCHLEAR IMPLANT Right 11/12/2018   Procedure: COCHLEAR IMPLANT RIGHT EAR;  Surgeon: Ermalinda Barrios, MD;  [facility];  Service: ENT;  Laterality: Right;   KNEE ARTHROSCOPY  03/13/2012   Procedure: ARTHROSCOPY KNEE;  Surgeon: Shelda Pal, MD;  Location: [facility];  Service: Orthopedics;  Laterality: Right;  RIGHT KNEE DIAGNOSTIC AND OPERATIVE SCOPE   WISDOM TOOTH EXTRACTION  AGE 56   DENTAL OFFICE    Social History   Tobacco Use   Smoking status: Former    Types: Cigarettes    Quit date: 03/10/2008    Years since quitting: 13.3   Smokeless tobacco: Never   Tobacco comments:    SOCIAL SMOKER IN COLLEGE FOR 36YRS -- NO SMOKING SINCE  Vaping Use   Vaping Use: Never used  Substance Use Topics   Alcohol use: Yes    Alcohol/week: 3.0 - 5.0 standard drinks    Types: 3 - 5 Glasses of wine per week    Comment: OCCASIONAL, "social"    Drug use: No    Family History  Problem Relation Age of Onset   Depression Mother    Hypertension Mother    Mental illness Mother    Anxiety disorder Maternal Grandmother     Allergies  Allergen Reactions  Contrast Media [Iodinated Diagnostic Agents] Itching    Swelling, high heart rate   Amoxicillin Hives   Tdap [Tetanus-Diphth-Acell Pertussis]    Hydrocodeine [Dihydrocodeine] Rash   Penicillins Rash    Medication list has been reviewed and updated.  Current Outpatient Medications on File Prior to Visit  Medication Sig Dispense Refill   cetirizine (ZYRTEC) 10 MG tablet Take 10 mg by mouth daily.     FLUoxetine (PROZAC) 10 MG tablet Take 10- 20 mg daily and taper as tolerated 60 tablet 0   hydrOXYzine (VISTARIL) 25 MG capsule Take 1 capsule (25 mg total) by mouth 3 (three) times daily as needed. 30 capsule 0   lidocaine (XYLOCAINE) 5 % ointment Apply topically.      meloxicam (MOBIC) 7.5 MG tablet Take 7.5 mg by mouth daily.     methocarbamol (ROBAXIN) 500 MG tablet Take 1 tablet (500 mg total) by mouth every 8 (eight) hours as needed for muscle spasms. 15 tablet 0   moxifloxacin (AVELOX) 400 MG tablet Take 1 tablet (400 mg total) by mouth daily. 7 tablet 0   Probiotic Product (PROBIOTIC DAILY PO) Take by mouth.     sertraline (ZOLOFT) 100 MG tablet Take 2 tablets (200 mg total) by mouth daily. 180 tablet 1   triamcinolone (KENALOG) 0.1 % paste Apply to canker sore twice a day as needed 5 g 2   valACYclovir (VALTREX) 1000 MG tablet Take 0.5-1 tablets (500-1,000 mg total) by mouth daily. Use as needed for suppression 90 tablet 1   No current facility-administered medications on file prior to visit.    Review of Systems:  As per HPI- otherwise negative.   Physical Examination: There were no vitals filed for this visit. There were no vitals filed for this visit. There is no height or weight on file to calculate BMI. Ideal Body Weight:    GEN: no acute distress. HEENT: Atraumatic, Normocephalic.  Ears and Nose: No external deformity. CV: RRR, No M/G/R. No JVD. No thrill. No extra heart sounds. PULM: CTA B, no wheezes, crackles, rhonchi. No retractions. No resp. distress. No accessory muscle use. ABD: S, NT, ND, +BS. No rebound. No HSM. EXTR: No c/c/e PSYCH: Normally interactive. Conversant.    Assessment and Plan: *** This visit occurred during the SARS-CoV-2 public health emergency.  Safety protocols were in place, including screening questions prior to the visit, additional usage of staff PPE, and extensive cleaning of exam room while observing appropriate contact time as indicated for disinfecting solutions.   Signed Abbe Amsterdam, MD

## 2021-07-10 ENCOUNTER — Encounter: Payer: BC Managed Care – PPO | Admitting: Family Medicine

## 2021-07-12 ENCOUNTER — Other Ambulatory Visit: Payer: Self-pay | Admitting: Family Medicine

## 2021-07-12 LAB — OB RESULTS CONSOLE HIV ANTIBODY (ROUTINE TESTING): HIV: NONREACTIVE

## 2021-07-12 LAB — OB RESULTS CONSOLE HEPATITIS B SURFACE ANTIGEN: Hepatitis B Surface Ag: NEGATIVE

## 2021-07-12 LAB — OB RESULTS CONSOLE GC/CHLAMYDIA
Chlamydia: NEGATIVE
Gonorrhea: NEGATIVE

## 2021-07-12 LAB — OB RESULTS CONSOLE RPR: RPR: NONREACTIVE

## 2021-07-12 LAB — OB RESULTS CONSOLE ABO/RH: RH Type: POSITIVE

## 2021-07-12 LAB — OB RESULTS CONSOLE ANTIBODY SCREEN: Antibody Screen: NEGATIVE

## 2021-07-12 LAB — OB RESULTS CONSOLE RUBELLA ANTIBODY, IGM: Rubella: NON-IMMUNE/NOT IMMUNE

## 2021-07-31 NOTE — Progress Notes (Signed)
[facility] [address] [place], [place] [address] [phone]  Date:  08/03/2021   Name:  Patricia Morse   DOB:  12/12/1985   MRN:  [id number]  PCP:  Pearline Cables, MD    Chief Complaint: Annual Exam   History of Present Illness:  Patricia Morse is a 35 y.o. very pleasant female patient who presents with the following:  Pt seen today for a CPE Last seen by myself in March of this year  She is also pregnant right now- due 02/03/21.  It's a boy!  Her husband is very excited She is 14 weeks now  She hurt her back and is seeing a chiropractor which seems to be helping  She wonders about doing physical therapy and chiropractory does not resolve her symptoms.  Advised her that should be fine  Her husband Casimiro Needle has tried to massage it, etc  She is using sertraline- they have decided to continue this during her pregnancy due to adverse effects of stopping it.   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   LEFT EAR     Depression    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   RESIDUAL ABSENCE OF KNEE REFLEX   Internal derangement of right knee    PONV (postoperative nausea and vomiting) POSTOP COCHLEAR  IMPLANT 2006   Seasonal allergies     Past Surgical History:  Procedure Laterality Date   BILATERAL MIDDLE EAR EXPLORATION/ SEALING OF OVAL AND ROUND WINDOW FISTULAS  1999 (AGE 35)   PERILYMPH FISTULAS   CHONDROPLASTY  03/13/2012   Procedure: CHONDROPLASTY;  Surgeon: Shelda Pal, MD;  Location: [facility];  Service: Orthopedics;  Laterality: Right;   COCHLEAR IMPLANT  11-15-2005  (AGE 26)   LEFT EAR  (CLARION HIGH-RESOLUTION 90K MULTICHANNEL,  1-J ELECTRODE)   COCHLEAR IMPLANT Right 11/12/2018   Procedure: COCHLEAR IMPLANT RIGHT EAR;  Surgeon: Ermalinda Barrios, MD;  Location: [facility];  Service: ENT;  Laterality: Right;   KNEE ARTHROSCOPY  03/13/2012   Procedure: ARTHROSCOPY KNEE;  Surgeon: Shelda Pal, MD;  Location: [facility];  Service: Orthopedics;  Laterality: Right;  RIGHT KNEE DIAGNOSTIC AND OPERATIVE SCOPE   WISDOM TOOTH EXTRACTION  AGE 48   DENTAL OFFICE    Social History   Tobacco Use   Smoking status: Former    Types: Cigarettes    Quit date: 03/10/2008    Years  since quitting: 13.4   Smokeless tobacco: Never   Tobacco comments:    SOCIAL SMOKER IN COLLEGE FOR 47YRS -- NO SMOKING SINCE  Vaping Use   Vaping Use: Never used  Substance Use Topics   Alcohol use: Yes    Alcohol/week: 3.0 - 5.0 standard drinks    Types: 3 - 5 Glasses of wine per week    Comment: OCCASIONAL, "social"    Drug use: No    Family History  Problem Relation Age of Onset   Depression Mother    Hypertension Mother    Mental illness Mother    Anxiety disorder Maternal Grandmother     Allergies  Allergen Reactions   Contrast Media [Iodinated Diagnostic Agents] Itching    Swelling, high heart rate   Amoxicillin Hives   Tdap  [Tetanus-Diphth-Acell Pertussis]    Hydrocodeine [Dihydrocodeine] Rash   Penicillins Rash    Medication list has been reviewed and updated.  Current Outpatient Medications on File Prior to Visit  Medication Sig Dispense Refill   cetirizine (ZYRTEC) 10 MG tablet Take 10 mg by mouth daily.     Probiotic Product (PROBIOTIC DAILY PO) Take by mouth.     sertraline (ZOLOFT) 100 MG tablet Take 2 tablets (200 mg total) by mouth daily. 180 tablet 1   valACYclovir (VALTREX) 1000 MG tablet Take 0.5-1 tablets (500-1,000 mg total) by mouth daily. Use as needed for suppression 90 tablet 1   No current facility-administered medications on file prior to visit.    Review of Systems:  As per HPI- otherwise negative.   Physical Examination: Vitals:   08/03/21 0850  BP: 118/80  Pulse: 98  Resp: 18  SpO2: 99%   Vitals:   08/03/21 0850  Weight: 212 lb (96.2 kg)  Height: 5\' 5"  (1.651 m)   Body mass index is 35.28 kg/m. Ideal Body Weight: Weight in (lb) to have BMI = 25: 149.9  GEN: no acute distress.  Overweight, looks well HEENT: Atraumatic, Normocephalic.  Uses cochlear implant for hearing loss Ears and Nose: No external deformity. CV: RRR, No M/G/R. No JVD. No thrill. No extra heart sounds. PULM: CTA B, no wheezes, crackles, rhonchi. No retractions. No resp. distress. No accessory muscle use. ABD: S, NT, ND, +BS. No rebound. No HSM.  Fundus not yet clearly palpable EXTR: No c/c/e PSYCH: Normally interactive. Conversant.  There is some spasm and tightness in the bilateral lumbar musculature  Assessment and Plan: Physical exam  Depression with anxiety Seen today for physical exam.  Encouraged healthy diet and exercise routine.  Patient is pregnant, she is just finishing her first trimester.  She is under the care of psychiatry for anxiety and depression as well as OB/GYN.  Overall things are going well, except for back pain.  We discussed safe ways to treat pain during  pregnancy-Tylenol is okay, she is currently seeing a chiropractor  Offered support and our well wishes, she will let me know if I can do any else to help  This visit occurred during the SARS-CoV-2 public health emergency.  Safety protocols were in place, including screening questions prior to the visit, additional usage of staff PPE, and extensive cleaning of exam room while observing appropriate contact time as indicated for disinfecting solutions.   Signed , MD

## 2021-08-03 ENCOUNTER — Encounter: Payer: Self-pay | Admitting: Family Medicine

## 2021-08-03 ENCOUNTER — Ambulatory Visit (INDEPENDENT_AMBULATORY_CARE_PROVIDER_SITE_OTHER): Payer: BC Managed Care – PPO | Admitting: Family Medicine

## 2021-08-03 ENCOUNTER — Other Ambulatory Visit: Payer: Self-pay

## 2021-08-03 VITALS — BP 118/80 | HR 98 | Resp 18 | Ht 65.0 in | Wt 212.0 lb

## 2021-08-03 DIAGNOSIS — F418 Other specified anxiety disorders: Secondary | ICD-10-CM | POA: Diagnosis not present

## 2021-08-03 DIAGNOSIS — Z0000 Encounter for general adult medical examination without abnormal findings: Secondary | ICD-10-CM | POA: Diagnosis not present

## 2021-08-03 NOTE — Patient Instructions (Signed)
It was good to see you again today!  I hope that your pregnancy continues to go smoothly.  Let me know if I can do anything to help

## 2021-11-26 NOTE — L&D Delivery Note (Signed)
Delivery Note:   G1P0 at [redacted]w[redacted]d  Admitting diagnosis: Encounter for induction of labor [Z34.90] Risks: gHTN, anxiety/depression on Zoloft and Buspar Onset of labor: 01/13/2022 at 1308 IOL/Augmentation: AROM, Pitocin, Cytotec, and IP Foley ROM: 01/13/2022 at 1308, clear fluid  Complete dilation at 01/13/2022  2010 Onset of pushing at 2019 FHR second stage Cat II, variables with pushing, moderate variability throughout  Analgesia /Anesthesia intrapartum:Epidural  Pushing in lithotomy position with CNM and L&D staff support. Husband, Casimiro Needle, and mother, Gillis Ends, present for birth and supportive.  Delivery of a Live born female  Birth Weight:  pending APGAR: 8, 9  Newborn Delivery   Birth date/time: 01/13/2022 20:55:00 Delivery type: Vaginal, Spontaneous     in cephalic presentation, position OA to LOA.  APGAR:1 min-8 , 5 min-9   Nuchal Cord: Yes  x 1 nuchal loose Cord double clamped after cessation of pulsation, cut by Sybil.  Collection of cord blood for typing completed. Cord blood donation-None  Arterial cord blood sample-No    Placenta delivered-Spontaneous  with 3 vessels . Uterotonics: Pitocin Placenta to L&D Uterine tone firm  Bleeding scant  2nd degree  laceration identified.  Episiotomy:None  Local analgesia: N/A  Repair: 2-0 in usual fashion with excellent hemostasis Est. Blood Loss (mL):150.00   Complications: None  Mom to postpartum.  Baby Dean to [facility] care / Skin to Skin.  Delivery Report:   Review the Delivery Report for details.    June Leap, CNM, MSN 01/13/2022, 9:27 PM

## 2021-11-28 ENCOUNTER — Telehealth: Payer: Self-pay | Admitting: Family Medicine

## 2021-11-28 NOTE — Telephone Encounter (Signed)
pt have flu symps, neg covid results. pt ended up receiving tdap during an obgyn ov and feels as though it has made her worse. pt is pregnant and would like suggestions as far as otc meds. please advise.

## 2021-11-29 ENCOUNTER — Encounter: Payer: Self-pay | Admitting: Family Medicine

## 2021-11-29 NOTE — Telephone Encounter (Signed)
I sent a MyChart message to patient.

## 2021-11-29 NOTE — Telephone Encounter (Signed)
Should she contact OB?

## 2021-12-02 ENCOUNTER — Other Ambulatory Visit: Payer: Self-pay

## 2021-12-02 ENCOUNTER — Inpatient Hospital Stay (HOSPITAL_COMMUNITY)
Admission: AD | Admit: 2021-12-02 | Discharge: 2021-12-02 | Disposition: A | Discharge status: Home / Self Care | Payer: BC Managed Care – PPO | Attending: Obstetrics & Gynecology | Admitting: Obstetrics & Gynecology

## 2021-12-02 ENCOUNTER — Encounter (HOSPITAL_COMMUNITY): Payer: Self-pay

## 2021-12-02 DIAGNOSIS — O368130 Decreased fetal movements, third trimester, not applicable or unspecified: Secondary | ICD-10-CM | POA: Diagnosis not present

## 2021-12-02 DIAGNOSIS — Z3A31 31 weeks gestation of pregnancy: Secondary | ICD-10-CM

## 2021-12-02 DIAGNOSIS — F419 Anxiety disorder, unspecified: Secondary | ICD-10-CM | POA: Diagnosis not present

## 2021-12-02 DIAGNOSIS — O99891 Other specified diseases and conditions complicating pregnancy: Secondary | ICD-10-CM | POA: Diagnosis not present

## 2021-12-02 DIAGNOSIS — Z3689 Encounter for other specified antenatal screening: Secondary | ICD-10-CM | POA: Diagnosis not present

## 2021-12-02 DIAGNOSIS — O99343 Other mental disorders complicating pregnancy, third trimester: Secondary | ICD-10-CM | POA: Insufficient documentation

## 2021-12-02 DIAGNOSIS — R030 Elevated blood-pressure reading, without diagnosis of hypertension: Secondary | ICD-10-CM

## 2021-12-02 LAB — COMPREHENSIVE METABOLIC PANEL
ALT: 11 U/L (ref 0–44)
AST: 21 U/L (ref 15–41)
Albumin: 2.7 g/dL — ABNORMAL LOW (ref 3.5–5.0)
Alkaline Phosphatase: 110 U/L (ref 38–126)
Anion gap: 13 (ref 5–15)
BUN: <5 mg/dL — ABNORMAL LOW (ref 6–20)
CO2: 18 mmol/L — ABNORMAL LOW (ref 22–32)
Calcium: 8.8 mg/dL — ABNORMAL LOW (ref 8.9–10.3)
Chloride: 103 mmol/L (ref 98–111)
Creatinine, Ser: 0.57 mg/dL (ref 0.44–1.00)
GFR, Estimated: >60 mL/min (ref >60)
Glucose, Bld: 119 mg/dL — ABNORMAL HIGH (ref 70–99)
Potassium: 3.4 mmol/L — ABNORMAL LOW (ref 3.5–5.1)
Sodium: 134 mmol/L — ABNORMAL LOW (ref 135–145)
Total Bilirubin: 0.4 mg/dL (ref 0.3–1.2)
Total Protein: 6.1 g/dL — ABNORMAL LOW (ref 6.5–8.1)

## 2021-12-02 LAB — CBC
HCT: 33.8 % — ABNORMAL LOW (ref 36.0–46.0)
Hemoglobin: 11.8 g/dL — ABNORMAL LOW (ref 12.0–15.0)
MCH: 30.6 pg (ref 26.0–34.0)
MCHC: 34.9 g/dL (ref 30.0–36.0)
MCV: 87.6 fL (ref 80.0–100.0)
Platelets: 186 10*3/uL (ref 150–400)
RBC: 3.86 MIL/uL — ABNORMAL LOW (ref 3.87–5.11)
RDW: 13.6 % (ref 11.5–15.5)
WBC: 9.6 10*3/uL (ref 4.0–10.5)
nRBC: 0 % (ref 0.0–0.2)

## 2021-12-02 LAB — PROTEIN / CREATININE RATIO, URINE
Creatinine, Urine: 132.68 mg/dL
Protein Creatinine Ratio: 0.09 mg/mg{Cre} (ref 0.00–0.15)
Total Protein, Urine: 12 mg/dL

## 2021-12-02 NOTE — MAU Provider Note (Addendum)
History     CSN: [id number]  Arrival date and time: 12/02/21 1756   Event Date/Time   First Provider Initiated Contact with Patient 12/02/21 1847      Chief Complaint  Patient presents with   Decreased Fetal Movement   HPI Patricia Morse is a 36 y.o. G1P0 at [redacted]w[redacted]d who presents with decreased fetal movement for the last 3 days. She feels like she isn't feeling the baby move normally even though she is feeling the baby move. She denies any bleeding or leaking. Denies any pain.   She was found to be hypertensive upon arrival to MAU. She denies any HA, visual changes or epigastric pain. She feels that this is related to anxiety.  She gets prenatal care at [facility] and denies any problems in the pregnancy so far.   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   RESIDUAL ABSENCE OF KNEE REFLEX   Internal derangement of right knee    PONV (postoperative nausea and vomiting) POSTOP COCHLEAR  IMPLANT 2006   Seasonal allergies     Past Surgical History:  Procedure Laterality Date   BILATERAL MIDDLE EAR EXPLORATION/ SEALING OF OVAL AND ROUND WINDOW FISTULAS  1999 (AGE 36)   PERILYMPH FISTULAS   CHONDROPLASTY  03/13/2012   Procedure: CHONDROPLASTY;  Surgeon: Mauri Pole, MD;  Location: [place];  Service: Orthopedics;  Laterality: Right;   COCHLEAR IMPLANT  11-15-2005  (AGE 63)   LEFT EAR  (CLARION HIGH-RESOLUTION 90K MULTICHANNEL,  1-J ELECTRODE)   COCHLEAR IMPLANT Right 11/12/2018   Procedure: COCHLEAR IMPLANT RIGHT EAR;  Surgeon: Vicie Mutters, MD;  Location:  [place];  Service: ENT;  Laterality: Right;   KNEE ARTHROSCOPY  03/13/2012   Procedure: ARTHROSCOPY KNEE;  Surgeon: Mauri Pole, MD;  Location: [facility];  Service: Orthopedics;  Laterality: Right;  RIGHT KNEE DIAGNOSTIC AND OPERATIVE SCOPE   WISDOM TOOTH EXTRACTION  AGE 34   DENTAL OFFICE    Family History  Problem Relation Age of Onset   Depression Mother    Hypertension Mother    Mental illness Mother    Anxiety disorder Maternal Grandmother     Social History   Tobacco Use   Smoking status: Former    Types: Cigarettes    Quit date: 03/10/2008    Years since quitting: 13.7   Smokeless tobacco: Never   Tobacco comments:    SOCIAL SMOKER IN COLLEGE FOR 73YRS -- NO SMOKING SINCE  Vaping Use   Vaping Use: Never used  Substance Use Topics   Alcohol use: Yes    Alcohol/week: 3.0 - 5.0 standard drinks    Types: 3 - 5 Glasses of wine per week    Comment: OCCASIONAL, "  social"    Drug use: No    Allergies:  Allergies  Allergen Reactions   Contrast Media [Iodinated Contrast Media] Itching    Swelling, high heart rate   Amoxicillin Hives   Tdap [Tetanus-Diphth-Acell Pertussis]    Hydrocodeine [Dihydrocodeine] Rash   Penicillins Rash    Medications Prior to Admission  Medication Sig Dispense Refill Last Dose   Prenatal Vit-Fe Fumarate-FA (PRENATAL MULTIVITAMIN) TABS tablet Take 1 tablet by mouth daily at 12 noon.      cetirizine (ZYRTEC) 10 MG tablet Take 10 mg by mouth daily.      Probiotic Product (PROBIOTIC DAILY PO) Take by mouth.      sertraline (ZOLOFT) 100 MG tablet Take 2 tablets (200 mg total) by mouth daily. 180 tablet 1    valACYclovir (VALTREX) 1000 MG tablet Take 0.5-1 tablets (500-1,000 mg total) by mouth daily. Use as needed for suppression 90 tablet 1     Review of Systems  Constitutional: Negative.  Negative for chills, fatigue and fever.  HENT: Negative.    Respiratory: Negative.  Negative for shortness of breath.    Cardiovascular: Negative.  Negative for chest pain.  Gastrointestinal: Negative.  Negative for abdominal pain, constipation, diarrhea, nausea and vomiting.  Genitourinary: Negative.  Negative for dysuria, vaginal bleeding and vaginal discharge.  Neurological: Negative.  Negative for dizziness and headaches.  Physical Exam   Blood pressure (!) 141/83, pulse (!) 117, temperature 98.1 F (36.7 C), temperature source Oral, resp. rate 16, SpO2 98 %.  Patient Vitals for the past 24 hrs:  BP Temp Temp src Pulse Resp SpO2  12/02/21 2015 126/90 -- -- (!) 103 -- --  12/02/21 2009 133/81 -- -- 98 -- --  12/02/21 1945 -- -- -- -- -- 99 %  12/02/21 1940 -- -- -- -- -- 99 %  12/02/21 1935 (!) 139/92 -- -- (!) 101 -- 98 %  12/02/21 1930 -- -- -- -- -- 99 %  12/02/21 1925 -- -- -- -- -- 98 %  12/02/21 1920 -- -- -- -- -- 99 %  12/02/21 1915 -- -- -- -- -- 99 %  12/02/21 1910 -- -- -- -- -- 98 %  12/02/21 1905 -- -- -- -- -- 97 %  12/02/21 1855 -- -- -- -- -- 96 %  12/02/21 1850 -- -- -- -- -- 97 %  12/02/21 1846 137/73 -- -- 95 -- --  12/02/21 1845 -- -- -- -- -- 96 %  12/02/21 1840 -- -- -- -- -- 97 %  12/02/21 1835 -- -- -- -- -- 96 %  12/02/21 1810 (!) 141/83 98.1 F (36.7 C) Oral (!) 117 16 98 %     Physical Exam Vitals and nursing note reviewed.  Constitutional:      General: She is not in acute distress.    Appearance: She is well-developed.  HENT:     Head: Normocephalic.  Eyes:     Pupils: Pupils are equal, round, and reactive to light.  Cardiovascular:     Rate and Rhythm: Normal rate and regular rhythm.     Heart sounds: Normal heart sounds.  Pulmonary:     Effort: Pulmonary effort is normal. No respiratory distress.     Breath sounds: Normal breath sounds.  Abdominal:     General: Bowel sounds are normal. There is no distension.     Palpations: Abdomen is soft.     Tenderness: There is no abdominal tenderness.  Skin:    General:  Skin is warm and dry.  Neurological:      Mental Status: She is alert and oriented to person, place, and time.  Psychiatric:        Mood and Affect: Mood normal.        Behavior: Behavior normal.        Thought Content: Thought content normal.        Judgment: Judgment normal.   Fetal Tracing:  Baseline: 120 Variability: moderate Accels: 15x15 Decels: none  Toco: none   MAU Course  Procedures Results for orders placed or performed during the hospital encounter of 12/02/21 (from the past 24 hour(s))  Protein / creatinine ratio, urine     Status: None   Collection Time: 12/02/21  6:52 PM  Result Value Ref Range   Creatinine, Urine 132.68 mg/dL   Total Protein, Urine 12 mg/dL   Protein Creatinine Ratio 0.09 0.00 - 0.15 mg/mg[Cre]  CBC     Status: Abnormal   Collection Time: 12/02/21  7:27 PM  Result Value Ref Range   WBC 9.6 4.0 - 10.5 K/uL   RBC 3.86 (L) 3.87 - 5.11 MIL/uL   Hemoglobin 11.8 (L) 12.0 - 15.0 g/dL   HCT 33.8 (L) 36.0 - 46.0 %   MCV 87.6 80.0 - 100.0 fL   MCH 30.6 26.0 - 34.0 pg   MCHC 34.9 30.0 - 36.0 g/dL   RDW 13.6 11.5 - 15.5 %   Platelets 186 150 - 400 K/uL   nRBC 0.0 0.0 - 0.2 %  Comprehensive metabolic panel     Status: Abnormal   Collection Time: 12/02/21  7:27 PM  Result Value Ref Range   Sodium 134 (L) 135 - 145 mmol/L   Potassium 3.4 (L) 3.5 - 5.1 mmol/L   Chloride 103 98 - 111 mmol/L   CO2 18 (L) 22 - 32 mmol/L   Glucose, Bld 119 (H) 70 - 99 mg/dL   BUN <5 (L) 6 - 20 mg/dL   Creatinine, Ser 0.57 0.44 - 1.00 mg/dL   Calcium 8.8 (L) 8.9 - 10.3 mg/dL   Total Protein 6.1 (L) 6.5 - 8.1 g/dL   Albumin 2.7 (L) 3.5 - 5.0 g/dL   AST 21 15 - 41 U/L   ALT 11 0 - 44 U/L   Alkaline Phosphatase 110 38 - 126 U/L   Total Bilirubin 0.4 0.3 - 1.2 mg/dL   GFR, Estimated >60 >60 mL/min   Anion gap 13 5 - 15     MDM Prenatal records from private office reviewed. Pregnancy uncomplicated. Labs ordered and reviewed. Anterior placenta per chart review  UA Initial BP elevated. No hx HTN.  Denies HA, visual changes or epigastric pain CBC, CMP, Protein/creat ratio  Care turned over to E. Ramatoulaye Pack NP at 1900.  Wende Mott, CNM 12/02/21  Reactive fetal tracing with patient documenting good fetal movement in MAU  Preeclampsia labs normal. Dr. Benjie Karvonen notified of elevated BPs & need for f/u appointment. She will send message to patient's midwife but also recommends patient calling office on Monday to schedule her BP appointment Assessment and Plan   1. Elevated BP without diagnosis of hypertension  -reviewed s/s preeclampsia & reasons to return to MAU -call [facility] on Monday to schedule BP check appointment  2. Decreased fetal movements in third trimester, single or unspecified fetus   3. NST (non-stress test) reactive   4. [redacted] weeks gestation of pregnancy     Jorje Guild, NP

## 2021-12-02 NOTE — MAU Note (Signed)
Patricia Morse is a 36 y.o. at [redacted]w[redacted]d here in MAU reporting: DFM for a couple of days, states she has felt some movement but much less than normal. Denies pain, bleeding, and LOF.  Onset of complaint: ongoing  Pain score: 0/10  Vitals:   12/02/21 1810  BP: (!) 141/83  Pulse: (!) 117  Resp: 16  Temp: 98.1 F (36.7 C)  SpO2: 98%     FHT:145, FM palpated  Lab orders placed from triage: none

## 2021-12-16 ENCOUNTER — Encounter: Payer: Self-pay | Admitting: Family Medicine

## 2021-12-18 MED ORDER — AZITHROMYCIN 250 MG PO TABS: ORAL_TABLET | ORAL | 0 refills | Status: AC

## 2022-01-04 LAB — OB RESULTS CONSOLE GBS: GBS: NEGATIVE

## 2022-01-08 ENCOUNTER — Encounter (HOSPITAL_COMMUNITY): Payer: Self-pay

## 2022-01-08 ENCOUNTER — Encounter (HOSPITAL_COMMUNITY): Payer: Self-pay | Admitting: *Deleted

## 2022-01-08 ENCOUNTER — Telehealth (HOSPITAL_COMMUNITY): Payer: Self-pay | Admitting: *Deleted

## 2022-01-08 NOTE — Telephone Encounter (Signed)
Preadmission screen  

## 2022-01-10 ENCOUNTER — Other Ambulatory Visit: Payer: Self-pay | Admitting: Obstetrics and Gynecology

## 2022-01-11 LAB — SARS CORONAVIRUS 2 (TAT 6-24 HRS): SARS Coronavirus 2: NEGATIVE

## 2022-01-12 ENCOUNTER — Other Ambulatory Visit: Payer: Self-pay | Admitting: Certified Nurse Midwife

## 2022-01-12 ENCOUNTER — Other Ambulatory Visit: Payer: Self-pay

## 2022-01-13 ENCOUNTER — Inpatient Hospital Stay (HOSPITAL_COMMUNITY)
Admission: RE | Admit: 2022-01-13 | Discharge: 2022-01-17 | DRG: 806 | Disposition: A | Discharge status: Home / Self Care | Payer: BC Managed Care – PPO | Attending: Obstetrics and Gynecology | Admitting: Obstetrics and Gynecology

## 2022-01-13 ENCOUNTER — Encounter: Payer: Self-pay | Admitting: Family Medicine

## 2022-01-13 ENCOUNTER — Encounter (HOSPITAL_COMMUNITY): Payer: Self-pay | Admitting: Certified Nurse Midwife

## 2022-01-13 ENCOUNTER — Inpatient Hospital Stay (HOSPITAL_COMMUNITY): Payer: BC Managed Care – PPO

## 2022-01-13 ENCOUNTER — Inpatient Hospital Stay (HOSPITAL_COMMUNITY): Payer: BC Managed Care – PPO | Admitting: Anesthesiology

## 2022-01-13 DIAGNOSIS — O3483 Maternal care for other abnormalities of pelvic organs, third trimester: Secondary | ICD-10-CM | POA: Diagnosis present

## 2022-01-13 DIAGNOSIS — F418 Other specified anxiety disorders: Secondary | ICD-10-CM

## 2022-01-13 DIAGNOSIS — F32A Depression, unspecified: Secondary | ICD-10-CM | POA: Diagnosis present

## 2022-01-13 DIAGNOSIS — Z2839 Other underimmunization status: Secondary | ICD-10-CM

## 2022-01-13 DIAGNOSIS — Z3A37 37 weeks gestation of pregnancy: Secondary | ICD-10-CM | POA: Diagnosis not present

## 2022-01-13 DIAGNOSIS — O99344 Other mental disorders complicating childbirth: Secondary | ICD-10-CM | POA: Diagnosis present

## 2022-01-13 DIAGNOSIS — H9193 Unspecified hearing loss, bilateral: Secondary | ICD-10-CM | POA: Diagnosis present

## 2022-01-13 DIAGNOSIS — O139 Gestational [pregnancy-induced] hypertension without significant proteinuria, unspecified trimester: Secondary | ICD-10-CM | POA: Diagnosis present

## 2022-01-13 DIAGNOSIS — N838 Other noninflammatory disorders of ovary, fallopian tube and broad ligament: Secondary | ICD-10-CM | POA: Diagnosis present

## 2022-01-13 DIAGNOSIS — Z87891 Personal history of nicotine dependence: Secondary | ICD-10-CM | POA: Diagnosis not present

## 2022-01-13 DIAGNOSIS — O9081 Anemia of the puerperium: Secondary | ICD-10-CM | POA: Diagnosis not present

## 2022-01-13 DIAGNOSIS — D62 Acute posthemorrhagic anemia: Secondary | ICD-10-CM | POA: Diagnosis not present

## 2022-01-13 DIAGNOSIS — H9190 Unspecified hearing loss, unspecified ear: Secondary | ICD-10-CM

## 2022-01-13 DIAGNOSIS — O09899 Supervision of other high risk pregnancies, unspecified trimester: Secondary | ICD-10-CM

## 2022-01-13 DIAGNOSIS — O9902 Anemia complicating childbirth: Secondary | ICD-10-CM | POA: Diagnosis not present

## 2022-01-13 DIAGNOSIS — Z3490 Encounter for supervision of normal pregnancy, unspecified, unspecified trimester: Secondary | ICD-10-CM | POA: Diagnosis present

## 2022-01-13 DIAGNOSIS — Z9621 Cochlear implant status: Secondary | ICD-10-CM | POA: Diagnosis present

## 2022-01-13 DIAGNOSIS — F419 Anxiety disorder, unspecified: Secondary | ICD-10-CM | POA: Diagnosis present

## 2022-01-13 DIAGNOSIS — O134 Gestational [pregnancy-induced] hypertension without significant proteinuria, complicating childbirth: Secondary | ICD-10-CM | POA: Diagnosis present

## 2022-01-13 DIAGNOSIS — O99892 Other specified diseases and conditions complicating childbirth: Secondary | ICD-10-CM | POA: Diagnosis present

## 2022-01-13 LAB — COMPREHENSIVE METABOLIC PANEL
ALT: 23 U/L (ref 0–44)
AST: 20 U/L (ref 15–41)
Albumin: 2.6 g/dL — ABNORMAL LOW (ref 3.5–5.0)
Alkaline Phosphatase: 154 U/L — ABNORMAL HIGH (ref 38–126)
Anion gap: 11 (ref 5–15)
BUN: 5 mg/dL — ABNORMAL LOW (ref 6–20)
CO2: 18 mmol/L — ABNORMAL LOW (ref 22–32)
Calcium: 8.3 mg/dL — ABNORMAL LOW (ref 8.9–10.3)
Chloride: 103 mmol/L (ref 98–111)
Creatinine, Ser: 0.56 mg/dL (ref 0.44–1.00)
GFR, Estimated: >60 mL/min (ref >60)
Glucose, Bld: 106 mg/dL — ABNORMAL HIGH (ref 70–99)
Potassium: 3.3 mmol/L — ABNORMAL LOW (ref 3.5–5.1)
Sodium: 132 mmol/L — ABNORMAL LOW (ref 135–145)
Total Bilirubin: 0.5 mg/dL (ref 0.3–1.2)
Total Protein: 5.7 g/dL — ABNORMAL LOW (ref 6.5–8.1)

## 2022-01-13 LAB — CBC
HCT: 33 % — ABNORMAL LOW (ref 36.0–46.0)
HCT: 33.2 % — ABNORMAL LOW (ref 36.0–46.0)
Hemoglobin: 11.4 g/dL — ABNORMAL LOW (ref 12.0–15.0)
Hemoglobin: 11.2 g/dL — ABNORMAL LOW (ref 12.0–15.0)
MCH: 30.6 pg (ref 26.0–34.0)
MCH: 29.6 pg (ref 26.0–34.0)
MCHC: 34.5 g/dL (ref 30.0–36.0)
MCHC: 33.7 g/dL (ref 30.0–36.0)
MCV: 88.5 fL (ref 80.0–100.0)
MCV: 87.8 fL (ref 80.0–100.0)
Platelets: 163 10*3/uL (ref 150–400)
Platelets: 147 10*3/uL — ABNORMAL LOW (ref 150–400)
RBC: 3.73 MIL/uL — ABNORMAL LOW (ref 3.87–5.11)
RBC: 3.78 MIL/uL — ABNORMAL LOW (ref 3.87–5.11)
RDW: 14.1 % (ref 11.5–15.5)
RDW: 14.1 % (ref 11.5–15.5)
WBC: 8.7 10*3/uL (ref 4.0–10.5)
WBC: 8.6 10*3/uL (ref 4.0–10.5)
nRBC: 0 % (ref 0.0–0.2)
nRBC: 0 % (ref 0.0–0.2)

## 2022-01-13 LAB — CBC WITH DIFFERENTIAL/PLATELET
Abs Immature Granulocytes: 0.07 10*3/uL (ref 0.00–0.07)
Basophils Absolute: 0 10*3/uL (ref 0.0–0.1)
Basophils Relative: 0 %
Eosinophils Absolute: 0 10*3/uL (ref 0.0–0.5)
Eosinophils Relative: 0 %
HCT: 34.7 % — ABNORMAL LOW (ref 36.0–46.0)
Hemoglobin: 11.8 g/dL — ABNORMAL LOW (ref 12.0–15.0)
Immature Granulocytes: 0 %
Lymphocytes Relative: 7 %
Lymphs Abs: 1.2 10*3/uL (ref 0.7–4.0)
MCH: 30 pg (ref 26.0–34.0)
MCHC: 34 g/dL (ref 30.0–36.0)
MCV: 88.3 fL (ref 80.0–100.0)
Monocytes Absolute: 1.3 10*3/uL — ABNORMAL HIGH (ref 0.1–1.0)
Monocytes Relative: 8 %
Neutro Abs: 14 10*3/uL — ABNORMAL HIGH (ref 1.7–7.7)
Neutrophils Relative %: 85 %
Platelets: 161 10*3/uL (ref 150–400)
RBC: 3.93 MIL/uL (ref 3.87–5.11)
RDW: 14 % (ref 11.5–15.5)
WBC: 16.6 10*3/uL — ABNORMAL HIGH (ref 4.0–10.5)
nRBC: 0 % (ref 0.0–0.2)

## 2022-01-13 LAB — TYPE AND SCREEN
ABO/RH(D): O POS
Antibody Screen: NEGATIVE

## 2022-01-13 LAB — PROTEIN / CREATININE RATIO, URINE
Creatinine, Urine: 240.09 mg/dL
Protein Creatinine Ratio: 0.11 mg/mg{Cre} (ref 0.00–0.15)
Total Protein, Urine: 26 mg/dL

## 2022-01-13 LAB — RPR: RPR Ser Ql: NONREACTIVE

## 2022-01-13 MED ORDER — BUSPIRONE HCL 5 MG PO TABS
5.0000 mg | ORAL_TABLET | Freq: Three times a day (TID) | ORAL | Status: DC | PRN
  Administered 2022-01-13: 5 mg via ORAL
  Filled 2022-01-13 (×2): qty 1

## 2022-01-13 MED ORDER — BUSPIRONE HCL 5 MG PO TABS
5.0000 mg | ORAL_TABLET | Freq: Three times a day (TID) | ORAL | Status: DC | PRN
  Administered 2022-01-14 – 2022-01-15 (×3): 5 mg via ORAL
  Filled 2022-01-13 (×5): qty 1

## 2022-01-13 MED ORDER — MISOPROSTOL 50MCG HALF TABLET
50.0000 ug | ORAL_TABLET | ORAL | Status: DC | PRN
  Administered 2022-01-13: 50 ug via BUCCAL
  Filled 2022-01-13: qty 1

## 2022-01-13 MED ORDER — LABETALOL HCL 100 MG PO TABS
100.0000 mg | ORAL_TABLET | Freq: Three times a day (TID) | ORAL | Status: DC
  Administered 2022-01-13 (×3): 100 mg via ORAL
  Filled 2022-01-13 (×3): qty 1

## 2022-01-13 MED ORDER — FENTANYL CITRATE (PF) 100 MCG/2ML IJ SOLN
100.0000 ug | INTRAMUSCULAR | Status: DC | PRN
Start: 2022-01-13 — End: 2022-01-13
  Administered 2022-01-13: 100 ug via INTRAVENOUS
  Filled 2022-01-13: qty 2

## 2022-01-13 MED ORDER — EPHEDRINE 5 MG/ML INJ: 10.0000 mg | INTRAVENOUS | Status: DC | PRN

## 2022-01-13 MED ORDER — IBUPROFEN 600 MG PO TABS
600.0000 mg | ORAL_TABLET | Freq: Four times a day (QID) | ORAL | Status: DC
  Administered 2022-01-14 – 2022-01-17 (×14): 600 mg via ORAL
  Filled 2022-01-13 (×15): qty 1

## 2022-01-13 MED ORDER — DIPHENHYDRAMINE HCL 25 MG PO CAPS
25.0000 mg | ORAL_CAPSULE | Freq: Four times a day (QID) | ORAL | Status: DC | PRN
  Administered 2022-01-16: 25 mg via ORAL
  Filled 2022-01-13: qty 1

## 2022-01-13 MED ORDER — ACETAMINOPHEN 500 MG PO TABS: 1000.0000 mg | ORAL_TABLET | Freq: Four times a day (QID) | ORAL | Status: DC | PRN

## 2022-01-13 MED ORDER — DIBUCAINE (PERIANAL) 1 % EX OINT
1.0000 | TOPICAL_OINTMENT | CUTANEOUS | Status: DC | PRN
Start: 2022-01-13 — End: 2022-01-17

## 2022-01-13 MED ORDER — FENTANYL-BUPIVACAINE-NACL 0.5-0.125-0.9 MG/250ML-% EP SOLN
12.0000 mL/h | EPIDURAL | Status: DC | PRN
  Administered 2022-01-13: 12 mL/h via EPIDURAL
  Filled 2022-01-13: qty 250

## 2022-01-13 MED ORDER — OXYTOCIN-SODIUM CHLORIDE 30-0.9 UT/500ML-% IV SOLN
2.5000 [IU]/h | INTRAVENOUS | Status: DC
  Administered 2022-01-13: 2.5 [IU]/h via INTRAVENOUS

## 2022-01-13 MED ORDER — OXYTOCIN BOLUS FROM INFUSION
333.0000 mL | Freq: Once | INTRAVENOUS | Status: AC
  Administered 2022-01-13: 333 mL via INTRAVENOUS

## 2022-01-13 MED ORDER — PHENYLEPHRINE 40 MCG/ML (10ML) SYRINGE FOR IV PUSH (FOR BLOOD PRESSURE SUPPORT): 80.0000 ug | PREFILLED_SYRINGE | INTRAVENOUS | Status: DC | PRN

## 2022-01-13 MED ORDER — SIMETHICONE 80 MG PO CHEW: 80.0000 mg | CHEWABLE_TABLET | ORAL | Status: DC | PRN

## 2022-01-13 MED ORDER — ONDANSETRON HCL 4 MG/2ML IJ SOLN: 4.0000 mg | INTRAMUSCULAR | Status: DC | PRN

## 2022-01-13 MED ORDER — SERTRALINE HCL 100 MG PO TABS
100.0000 mg | ORAL_TABLET | Freq: Every day | ORAL | Status: DC
  Administered 2022-01-13: 100 mg via ORAL
  Filled 2022-01-13: qty 1

## 2022-01-13 MED ORDER — WITCH HAZEL-GLYCERIN EX PADS
1.0000 | MEDICATED_PAD | CUTANEOUS | Status: DC | PRN
Start: 2022-01-13 — End: 2022-01-17

## 2022-01-13 MED ORDER — COCONUT OIL OIL: 1.0000 "application " | TOPICAL_OIL | Status: DC | PRN

## 2022-01-13 MED ORDER — LACTATED RINGERS IV SOLN: 500.0000 mL | Freq: Once | INTRAVENOUS | Status: DC

## 2022-01-13 MED ORDER — SOD CITRATE-CITRIC ACID 500-334 MG/5ML PO SOLN: 30.0000 mL | ORAL | Status: DC | PRN

## 2022-01-13 MED ORDER — ONDANSETRON HCL 4 MG PO TABS
4.0000 mg | ORAL_TABLET | ORAL | Status: DC | PRN
  Administered 2022-01-14: 4 mg via ORAL
  Filled 2022-01-13: qty 1

## 2022-01-13 MED ORDER — PRENATAL MULTIVITAMIN CH
1.0000 | ORAL_TABLET | Freq: Every day | ORAL | Status: DC
  Administered 2022-01-14 – 2022-01-17 (×4): 1 via ORAL
  Filled 2022-01-13 (×4): qty 1

## 2022-01-13 MED ORDER — DIPHENHYDRAMINE HCL 50 MG/ML IJ SOLN
12.5000 mg | INTRAMUSCULAR | Status: DC | PRN
  Administered 2022-01-13: 12.5 mg via INTRAVENOUS
  Filled 2022-01-13: qty 1

## 2022-01-13 MED ORDER — SENNOSIDES-DOCUSATE SODIUM 8.6-50 MG PO TABS
2.0000 | ORAL_TABLET | Freq: Every day | ORAL | Status: DC
  Administered 2022-01-14 – 2022-01-17 (×4): 2 via ORAL
  Filled 2022-01-13 (×4): qty 2

## 2022-01-13 MED ORDER — ONDANSETRON HCL 4 MG/2ML IJ SOLN
4.0000 mg | Freq: Four times a day (QID) | INTRAMUSCULAR | Status: DC | PRN
  Administered 2022-01-13: 4 mg via INTRAVENOUS
  Filled 2022-01-13: qty 2

## 2022-01-13 MED ORDER — LIDOCAINE HCL (PF) 1 % IJ SOLN
INTRAMUSCULAR | Status: DC | PRN
  Administered 2022-01-13: 3 mL via EPIDURAL
  Administered 2022-01-13: 2 mL via EPIDURAL
  Administered 2022-01-13: 5 mL via EPIDURAL

## 2022-01-13 MED ORDER — OXYTOCIN 10 UNIT/ML IJ SOLN: 10.0000 [IU] | Freq: Once | INTRAMUSCULAR | Status: DC

## 2022-01-13 MED ORDER — LACTATED RINGERS AMNIOINFUSION
INTRAVENOUS | Status: DC
Start: 2022-01-13 — End: 2022-01-13

## 2022-01-13 MED ORDER — TERBUTALINE SULFATE 1 MG/ML IJ SOLN
0.2500 mg | Freq: Once | INTRAMUSCULAR | Status: DC | PRN
Start: 2022-01-13 — End: 2022-01-13

## 2022-01-13 MED ORDER — ACETAMINOPHEN 325 MG PO TABS
650.0000 mg | ORAL_TABLET | ORAL | Status: DC | PRN
  Administered 2022-01-14 – 2022-01-17 (×11): 650 mg via ORAL
  Filled 2022-01-13 (×11): qty 2

## 2022-01-13 MED ORDER — SERTRALINE HCL 100 MG PO TABS
100.0000 mg | ORAL_TABLET | Freq: Every day | ORAL | Status: DC
  Administered 2022-01-14 – 2022-01-17 (×4): 100 mg via ORAL
  Filled 2022-01-13 (×4): qty 1

## 2022-01-13 MED ORDER — NIFEDIPINE ER OSMOTIC RELEASE 30 MG PO TB24
30.0000 mg | ORAL_TABLET | Freq: Every day | ORAL | Status: DC
  Administered 2022-01-14 – 2022-01-17 (×4): 30 mg via ORAL
  Filled 2022-01-13 (×4): qty 1

## 2022-01-13 MED ORDER — LIDOCAINE HCL (PF) 1 % IJ SOLN: 30.0000 mL | INTRAMUSCULAR | Status: DC | PRN

## 2022-01-13 MED ORDER — TETANUS-DIPHTH-ACELL PERTUSSIS 5-2.5-18.5 LF-MCG/0.5 IM SUSY: 0.5000 mL | PREFILLED_SYRINGE | Freq: Once | INTRAMUSCULAR | Status: DC

## 2022-01-13 MED ORDER — ZOLPIDEM TARTRATE 5 MG PO TABS: 5.0000 mg | ORAL_TABLET | Freq: Every evening | ORAL | Status: DC | PRN

## 2022-01-13 MED ORDER — OXYTOCIN-SODIUM CHLORIDE 30-0.9 UT/500ML-% IV SOLN
1.0000 m[IU]/min | INTRAVENOUS | Status: DC
  Administered 2022-01-13: 2 m[IU]/min via INTRAVENOUS
  Filled 2022-01-13: qty 500

## 2022-01-13 MED ORDER — BENZOCAINE-MENTHOL 20-0.5 % EX AERO
1.0000 "application " | INHALATION_SPRAY | CUTANEOUS | Status: DC | PRN
  Administered 2022-01-14: 1 via TOPICAL
  Filled 2022-01-13: qty 56

## 2022-01-13 NOTE — Progress Notes (Signed)
S: Comfortable with epidural. Husband, Casimiro Needle, and mother, Bernarda Caffey, present and supportive.   O: Vitals:   01/13/22 1631 01/13/22 1704 01/13/22 1731 01/13/22 1801  BP: 117/71 123/65 121/64 133/82  Pulse: 91 90 89 82  Resp: 18  18 16   Temp: 98.3 F (36.8 C)     TempSrc: Oral     Weight:      Height:       FHT:  FHR: 125 bpm, variability: moderate,  accelerations:  Present,  decelerations:  Present early decels UC:   regular, every 1-3 minutes, MVUs 220-260 SVE:   Dilation: 6 Effacement (%): 80 Station: -1 Exam by:: A Sadonna Kotara CNM  A / P: Induction of labor due to gestational hypertension, progressing well on Pitocin, MVUs adequate  Fetal Wellbeing:  Category I PEC: BPs WNL, labs stable, no signs or symptoms of toxicity Pain Control:  Epidural Anticipated MOD:  NSVD  Continue frequent position changes to allow for fetal rotation and descent.   Dr. [phone] updated on patient status and plan of care.   Billy Coast, CNM, MSN 01/13/2022, 6:22 PM

## 2022-01-13 NOTE — H&P (Signed)
OB ADMISSION/ HISTORY & PHYSICAL:  Admission Date: 01/13/2022 12:01 AM  Admit Diagnosis: Encounter for induction of labor [Z34.90]    Patricia Morse is a 36 y.o. female at 33w0dpresenting for induction of labor for gestational hypertension. Patient was diagnosed with gestational hypertension at 35 weeks and started on Labetalol 1051mq 8 hours. Labs have been reassuring. Denies PEC symptoms. Denies contractions, leaking of fluid, or vaginal bleeding. Endorses + fetal movement. Husband, MiLegrand Comopresent and supportive. Eagerly anticipating baby boy "DeScientist, physiological   Prenatal History: G1P0   EDC : 02/03/20[facility] primary M. 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, has cochlear implants Anxiety, taking Buspar 5mg61mpression, stable on Zoloft 100mg87mt complex cyst of uterine adnexa, stable size on serial U/S, will follow postpartum Rubella non-immune  Prenatal Labs: ABO, Rh: O (08/17 0000)  Antibody: NEG (02/18 0300) Rubella: Nonimmune (08/17 0000)  RPR: NON REACTIVE (02/18 0128)  HBsAg: Negative (08/17 0000)  HIV: Non-reactive (08/17 0000)  GBS: Negative/-- (02/09 0000)  1 hr Glucola : 124 G63tic Screening: Panorama low risk XY Ultrasound: normal XY anatomy, anterior placenta, high normal AFI at 24cm, last growth at 36 weeks AGA  TDaP          UTD COVID-19 Declined due to reaction after 1st dose    Maternal Diabetes: No Genetic Screening: Normal Maternal Ultrasounds/Referrals: Normal Fetal Ultrasounds or other Referrals:  None Maternal Substance Abuse:  No Significant Maternal Medications:  Meds include: Zoloft and Labetalol Significant Maternal Lab Results:  Group B Strep negative Other Comments:  None  Medical / Surgical History : Past medical history:  Past Medical History:  Diagnosis Date    Allergy    Auditory neuropathy BILATERAL   Bilateral sensorineural hearing loss    SECONDARY AUDITORY NEUROPATHY   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     Depression    History of encephalitis    unsure if it was encephalitis or guillian barre  no flu shots   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   RESIDUAL ABSENCE OF KNEE REFLEX   Internal derangement of right knee    PONV (postoperative nausea and vomiting) POSTOP COCHLEAR  IMPLANT 2006   Pregnancy induced hypertension    Seasonal allergies     Past surgical history:  Past Surgical History:  Procedure Laterality Date   BILATERAL MIDDLE EAR EXPLORATION/ SEALING OF OVAL AND ROUND WINDOW FISTULAS  1999 (AGE 57)   PERILYMPH FISTULAS   CHONDROPLASTY  03/13/2012   Procedure: CHONDROPLASTY;  Surgeon: MatthMauri Pole  [facility];  Laterality: Right;   COCHLEAR IMPLANT  11-15-2005  (AGE 21)   LEFT EAR  (CLARION HIGH-RESOLUTION 90K MULTICHANNEL,  1-J ELECTRODE)   COCHLEAR IMPLANT Right 11/12/2018   Procedure: COCHLEAR IMPLANT RIGHT EAR;  Surgeon: KrausVicie Mutters  [facility];  Laterality: Right;   KNEE ARTHROSCOPY  03/13/2012   Procedure: ARTHROSCOPY KNEE;  Surgeon: MatthMauri Pole  [facility];  Laterality: Right;  RIGHT KNEE DIAGNOSTIC AND OPERATIVE SCOPE   WISDOM TOOTH EXTRACTION  AGE 72   67NTAL OFFICE    Family History:  Family History  Problem Relation Age of Onset   Arthritis Mother    Anxiety disorder  Mother    Depression Mother    Hypertension Mother    Mental illness Mother    Hashimoto's thyroiditis Mother    Hypertension Father    Hyperlipidemia Father    Anxiety disorder Maternal Grandmother    Colon cancer Maternal Grandfather     Social History:  reports that she quit smoking about 13 years ago. Her smoking use included cigarettes. She has never used smokeless  tobacco. She reports that she does not currently use alcohol after a past usage of about 3.0 - 5.0 standard drinks per week. She reports that she does not use drugs. Allergies: Contrast media [iodinated contrast media], Amoxicillin, Hydrocodeine [dihydrocodeine], and Penicillins   Current Medications at time of admission:  Medications Prior to Admission  Medication Sig Dispense Refill Last Dose   busPIRone (BUSPAR) 5 MG tablet Take 5 mg by mouth 3 (three) times daily as needed.   01/13/2022   cetirizine (ZYRTEC) 10 MG tablet Take 10 mg by mouth daily.   01/13/2022   labetalol (NORMODYNE) 100 MG tablet Take 100 mg by mouth 3 (three) times daily.   01/13/2022 at 1830   Prenatal Vit-Fe Fumarate-FA (PRENATAL MULTIVITAMIN) TABS tablet Take 1 tablet by mouth daily at 12 noon.   01/13/2022   Probiotic Product (PROBIOTIC DAILY PO) Take by mouth.   01/13/2022   sertraline (ZOLOFT) 100 MG tablet Take 2 tablets (200 mg total) by mouth daily. (Patient taking differently: Take 1 tablet by mouth daily.) 180 tablet 1 01/12/2022   valACYclovir (VALTREX) 1000 MG tablet Take 0.5-1 tablets (500-1,000 mg total) by mouth daily. Use as needed for suppression 90 tablet 1 More than a month    Review of Systems: Review of Systems  Eyes:  Negative for blurred vision.  Gastrointestinal:  Negative for abdominal pain.  Neurological:  Negative for headaches.  Psychiatric/Behavioral:  The patient is nervous/anxious.    Physical Exam: Vital signs and nursing notes reviewed.  Patient Vitals for the past 24 hrs:  BP Temp Temp src Pulse Resp Height Weight  01/13/22 1110 140/90 -- -- 85 18 -- --  01/13/22 1106 131/83 -- -- 97 18 -- --  01/13/22 1101 137/90 -- -- 86 -- -- --  01/13/22 1056 135/83 -- -- 88 18 -- --  01/13/22 1050 (!) 137/95 -- -- 87 18 -- --  01/13/22 1047 (!) 143/95 -- -- 85 18 -- --  01/13/22 1044 (!) 143/104 -- -- 93 -- -- --  01/13/22 0950 (!) 156/96 -- -- 84 -- -- --  01/13/22 0825 (!) 140/91 -- -- 85  18 -- --  01/13/22 0724 121/86 -- -- 84 18 -- --  01/13/22 0706 122/83 98 F (36.7 C) Oral 91 16 -- --  01/13/22 0601 129/69 -- -- 89 14 -- --  01/13/22 0501 125/77 -- -- 88 14 -- --  01/13/22 0435 115/68 -- -- 85 14 -- --  01/13/22 0345 (!) 144/84 -- -- 89 14 -- --  01/13/22 0330 138/87 -- -- 89 16 -- --  01/13/22 0150 (!) 136/93 -- -- 90 16 -- --  01/13/22 0045 133/71 98.3 F (36.8 C) Oral 93 16 '5\' 5"'  (1.651 m) 103.9 kg    General: AAO x 3, NAD Heart: RRR Lungs:CTAB Abdomen: Gravid, NT Extremities: 1-2+ non-pitting edema Genitalia / VE: Dilation: FT Effacement (%): thick Station: high Presentation: Vertex Exam by: A. Ardenia Stiner CNM  FHR: 135 BPM, moderate variability, + accels, no decels TOCO: Contractions occasional  Labs:   Results  for orders placed or performed during the hospital encounter of 01/13/22 (from the past 24 hour(s))  CBC     Status: Abnormal   Collection Time: 01/13/22  1:28 AM  Result Value Ref Range   WBC 8.7 4.0 - 10.5 K/uL   RBC 3.73 (L) 3.87 - 5.11 MIL/uL   Hemoglobin 11.4 (L) 12.0 - 15.0 g/dL   HCT 33.0 (L) 36.0 - 46.0 %   MCV 88.5 80.0 - 100.0 fL   MCH 30.6 26.0 - 34.0 pg   MCHC 34.5 30.0 - 36.0 g/dL   RDW 14.1 11.5 - 15.5 %   Platelets 163 150 - 400 K/uL   nRBC 0.0 0.0 - 0.2 %  RPR     Status: None   Collection Time: 01/13/22  1:28 AM  Result Value Ref Range   RPR Ser Ql NON REACTIVE NON REACTIVE  Protein / creatinine ratio, urine     Status: None   Collection Time: 01/13/22  1:28 AM  Result Value Ref Range   Creatinine, Urine 240.09 mg/dL   Total Protein, Urine 26 mg/dL   Protein Creatinine Ratio 0.11 0.00 - 0.15 mg/mg[Cre]  Comprehensive metabolic panel     Status: Abnormal   Collection Time: 01/13/22  1:28 AM  Result Value Ref Range   Sodium 132 (L) 135 - 145 mmol/L   Potassium 3.3 (L) 3.5 - 5.1 mmol/L   Chloride 103 98 - 111 mmol/L   CO2 18 (L) 22 - 32 mmol/L   Glucose, Bld 106 (H) 70 - 99 mg/dL   BUN 5 (L) 6 - 20 mg/dL    Creatinine, Ser 0.56 0.44 - 1.00 mg/dL   Calcium 8.3 (L) 8.9 - 10.3 mg/dL   Total Protein 5.7 (L) 6.5 - 8.1 g/dL   Albumin 2.6 (L) 3.5 - 5.0 g/dL   AST 20 15 - 41 U/L   ALT 23 0 - 44 U/L   Alkaline Phosphatase 154 (H) 38 - 126 U/L   Total Bilirubin 0.5 0.3 - 1.2 mg/dL   GFR, Estimated >60 >60 mL/min   Anion gap 11 5 - 15  Type and screen     Status: None   Collection Time: 01/13/22  3:00 AM  Result Value Ref Range   ABO/RH(D) O POS    Antibody Screen NEG    Sample Expiration      01/16/2022,2359 Performed at [facility], [address], [place], [place] [address]   CBC     Status: Abnormal   Collection Time: 01/13/22  9:55 AM  Result Value Ref Range   WBC 8.6 4.0 - 10.5 K/uL   RBC 3.78 (L) 3.87 - 5.11 MIL/uL   Hemoglobin 11.2 (L) 12.0 - 15.0 g/dL   HCT 33.2 (L) 36.0 - 46.0 %   MCV 87.8 80.0 - 100.0 fL   MCH 29.6 26.0 - 34.0 pg   MCHC 33.7 30.0 - 36.0 g/dL   RDW 14.1 11.5 - 15.5 %   Platelets 147 (L) 150 - 400 K/uL   nRBC 0.0 0.0 - 0.2 %    Assessment:  36 y.o. G1P0 at [redacted]w[redacted]d gHTN  1. Induction of labor 2. FHR category 1 3. GBS negative 4. Desires epidural 5. Plans to breastfeed 6. Desires inpatient circumcision 7. gHTN, on Labetalol 1054mq 8 hours, stable labs 8. Rubella non-immune 9. Anxiety and depression, on medication  Plan:  1. Admit to BS 2. Routine L&D orders 3. Analgesia/anesthesia PRN  4. Buccal Cytotec 5052mq  4 hours 5. Consider Foley balloon when able 6. Admission labs for PEC reassuring, denies PEC symptoms, follow BP closely 7. Offer MMR postpartum 8. Close postpartum follow-up for mood 9. Anticipate NSVB  Dr. Ronita Hipps notified of admission/plan of care.  Suzan Nailer CNM, MSN 01/13/2022, 11:37 AM

## 2022-01-13 NOTE — Lactation Note (Addendum)
This note was copied from a baby's chart. Lactation Consultation Note  Patient Name: Patricia Morse [id number] Date: 01/13/2022 Reason for consult: L&D Initial assessment;1st time breastfeeding;Early term 37-38.6wks Age:34 hours RN informed LC mom is hard of hearing, mom reads lips. LC entered L&D and mom was doing skin to skin. LC did reverse pressure soften to help extend mom's nipple outward, see latch assessment below mom could benefit from using hand pump and breast shells. Infant did not latch at the breast, infant  only licked and taste. Mom knows to breastfeed infant according to primal cues, 8-12 times within 24 hours, skin to skin. Mom will continue to ask RN/LC for latch assistance on MBU. [place] congratulate parents on the birth of their son.  Maternal Data    Feeding Mother's Current Feeding Choice: Breast Milk  LATCH Score Latch: Too sleepy or reluctant, no latch achieved, no sucking elicited.  Audible Swallowing: None  Type of Nipple: Flat  Comfort (Breast/Nipple): Soft / non-tender  Hold (Positioning): Assistance needed to correctly position infant at breast and maintain latch.  LATCH Score: 4   Lactation Tools Discussed/Used    Interventions Interventions: Assisted with latch;Skin to skin;Breast compression;Adjust position;Support pillows;Position options;Education  Discharge    Consult Status Consult Status: Follow-up from L&D    Vicente Serene 01/13/2022, 9:54 PM

## 2022-01-13 NOTE — Progress Notes (Signed)
S: Feeling crampy. Discussed the R/B/A of Foley balloon placement for cervical ripening and patient consents to procedure. Husband, Casimiro Needle, present and supportive.   O: Vitals:   01/13/22 1110 01/13/22 1130 01/13/22 1200 01/13/22 1240  BP: 140/90 129/73 (!) 120/54 (!) 147/107  Pulse: 85 89 83 80  Resp: 18 16  18   Temp:    98.2 F (36.8 C)  TempSrc:    Oral  Weight:      Height:       FHT:  FHR: 135 bpm, variability: moderate,  accelerations:  Present,  decelerations:  Absent UC:   regular, every 2-4 minutes SVE:   Dilation: 1 Effacement (%): 50 Station: -2 Exam by: A. Jocob Dambach CNM  Foley balloon placed without difficulty. 60cc of fluid instilled in balloon and taped to leg for traction. Patient tolerated procedure well.   A / P: Induction of labor due to gestational diabetes, s/p 1 dose of buccal Cytotec, Foley balloon now placed  Fetal Wellbeing:  Category I PEC: BPs mild range, labs stable, no signs or symptoms of toxicity Pain Control:  Labor support without medications Anticipated MOD:  NSVD  Will start Pitocin 2x2 until Foley balloon is expelled. Consider AROM after Foley balloon expulsion.   Dr. updated on patient status and plan of care.   Billy Coast, CNM, MSN 01/13/2022, 12:44 PM

## 2022-01-13 NOTE — Anesthesia Procedure Notes (Signed)
Epidural Patient location during procedure: OB Start time: 01/13/2022 10:25 AM End time: 01/13/2022 10:33 AM  Staffing Anesthesiologist: Santa Lighter, MD Performed: anesthesiologist   Preanesthetic Checklist Completed: patient identified, IV checked, risks and benefits discussed, monitors and equipment checked, pre-op evaluation and timeout performed  Epidural Patient position: sitting Prep: DuraPrep Patient monitoring: blood pressure and continuous pulse ox Approach: midline Location: L3-L4 Injection technique: LOR air  Needle:  Needle type: Tuohy  Needle gauge: 17 G Needle length: 9 cm Needle insertion depth: 5 cm Catheter size: 19 Gauge Catheter at skin depth: 10 cm Test dose: negative and Other (1% Lidocaine)  Additional Notes Patient identified.  Risk benefits discussed including failed block, incomplete pain control, headache, nerve damage, paralysis, blood pressure changes, nausea, vomiting, reactions to medication both toxic or allergic, and postpartum back pain.  Patient expressed understanding and wished to proceed.  All questions were answered.  Sterile technique used throughout procedure and epidural site dressed with sterile barrier dressing. No paresthesia or other complications noted. The patient did not experience any signs of intravascular injection such as tinnitus or metallic taste in mouth nor signs of intrathecal spread such as rapid motor block. Please see nursing notes for vital signs. Reason for block:procedure for pain

## 2022-01-13 NOTE — Anesthesia Preprocedure Evaluation (Signed)
Anesthesia Evaluation  Patient identified by MRN, date of birth, ID band Patient awake    Reviewed: Allergy & Precautions, NPO status , Patient's Chart, lab work & pertinent test results  History of Anesthesia Complications (+) PONV and history of anesthetic complications  Airway Mallampati: II  TM Distance: >3 FB Neck ROM: Full    Dental  (+) Teeth Intact, Dental Advisory Given   Pulmonary former smoker,    Pulmonary exam normal breath sounds clear to auscultation       Cardiovascular hypertension (PIH), Normal cardiovascular exam Rhythm:Regular Rate:Normal     Neuro/Psych PSYCHIATRIC DISORDERS Anxiety Depression S/p cochlear implants     GI/Hepatic negative GI ROS, Neg liver ROS,   Endo/Other  Obesity   Renal/GU negative Renal ROS     Musculoskeletal negative musculoskeletal ROS (+)   Abdominal   Peds  Hematology  (+) Blood dyscrasia, anemia , Plt 147k   Anesthesia Other Findings Day of surgery medications reviewed with the patient.  Reproductive/Obstetrics (+) Pregnancy                             Anesthesia Physical Anesthesia Plan  ASA: 3  Anesthesia Plan: Epidural   Post-op Pain Management:    Induction:   PONV Risk Score and Plan: 3 and Treatment may vary due to age or medical condition  Airway Management Planned: Natural Airway  Additional Equipment:   Intra-op Plan:   Post-operative Plan:   Informed Consent: I have reviewed the patients History and Physical, chart, labs and discussed the procedure including the risks, benefits and alternatives for the proposed anesthesia with the patient or authorized representative who has indicated his/her understanding and acceptance.     Dental advisory given  Plan Discussed with:   Anesthesia Plan Comments: (Patient identified. Risks/Benefits/Options discussed with patient including but not limited to bleeding,  infection, nerve damage, paralysis, failed block, incomplete pain control, headache, blood pressure changes, nausea, vomiting, reactions to medication both or allergic, itching and postpartum back pain. Confirmed with bedside nurse the patient's most recent platelet count. Confirmed with patient that they are not currently taking any anticoagulation, have any bleeding history or any family history of bleeding disorders. Patient expressed understanding and wished to proceed. All questions were answered. )        Anesthesia Quick Evaluation

## 2022-01-13 NOTE — Progress Notes (Signed)
S: Comfortable with epidural. Discussed the R/B/A of AROM for labor induction and patient consents to procedure. Husband, Casimiro Needle, present and supportive.   O: Vitals:   01/13/22 1200 01/13/22 1240 01/13/22 1249 01/13/22 1301  BP: (!) 120/54 (!) 147/107 133/81 136/75  Pulse: 83 80 89 75  Resp:  18  16  Temp:  98.2 F (36.8 C)    TempSrc:  Oral    Weight:      Height:       FHT:  FHR: 135 bpm, variability: moderate,  accelerations:  Present,  decelerations:  Absent UC:   irregular, every 1-5 minutes SVE:   Dilation: 4.5 Effacement (%): 70 Station: -2 Exam by: A. Sumire Halbleib CNM  AROM of a large amount of clear fluid at 1308. IUPC placed without difficulty.  A / P: Induction of labor due to gestational hypertension, s/p 1 dose of buccal Cytotec and Foley balloon now expelled, Pitocin infusing at 66mu  Fetal Wellbeing:  Category I PEC: BPs mild range, labs stable, no signs or symptoms of toxicity Pain Control:  Epidural Anticipated MOD:  NSVD  Continue to titrate Pitocin until adequate MVUs.  Encourage frequent position changes to encourage fetal rotation and descent.   June Leap, CNM, MSN 01/13/2022, 1:15 PM

## 2022-01-14 ENCOUNTER — Encounter (HOSPITAL_COMMUNITY): Payer: Self-pay | Admitting: Obstetrics and Gynecology

## 2022-01-14 LAB — CBC
HCT: 30.4 % — ABNORMAL LOW (ref 36.0–46.0)
Hemoglobin: 10 g/dL — ABNORMAL LOW (ref 12.0–15.0)
MCH: 29.2 pg (ref 26.0–34.0)
MCHC: 32.9 g/dL (ref 30.0–36.0)
MCV: 88.9 fL (ref 80.0–100.0)
Platelets: 138 10*3/uL — ABNORMAL LOW (ref 150–400)
RBC: 3.42 MIL/uL — ABNORMAL LOW (ref 3.87–5.11)
RDW: 14.3 % (ref 11.5–15.5)
WBC: 15.3 10*3/uL — ABNORMAL HIGH (ref 4.0–10.5)
nRBC: 0 % (ref 0.0–0.2)

## 2022-01-14 MED ORDER — OXYCODONE HCL 5 MG PO TABS
5.0000 mg | ORAL_TABLET | ORAL | Status: DC | PRN
  Administered 2022-01-14 – 2022-01-17 (×3): 5 mg via ORAL
  Filled 2022-01-14 (×4): qty 1

## 2022-01-14 NOTE — Anesthesia Postprocedure Evaluation (Signed)
Anesthesia Post Note  Patient: Patricia Morse  Procedure(s) Performed: AN AD HOC LABOR EPIDURAL     Patient location during evaluation: Mother Baby Anesthesia Type: Epidural Level of consciousness: awake and alert and oriented Pain management: satisfactory to patient Vital Signs Assessment: post-procedure vital signs reviewed and stable Respiratory status: respiratory function stable Cardiovascular status: stable Postop Assessment: no headache, no backache, epidural receding, patient able to bend at knees, no signs of nausea or vomiting, adequate PO intake and able to ambulate Anesthetic complications: no   No notable events documented.  Last Vitals:  Vitals:   01/14/22 0445 01/14/22 1027  BP: 128/81 120/75  Pulse: 72 90  Resp: 16 18  Temp: 36.8 C 36.7 C  SpO2: 99% 99%    Last Pain:  Vitals:   01/14/22 1028  TempSrc:   PainSc: 7    Pain Goal:                   [name]

## 2022-01-14 NOTE — Clinical Social Work Maternal (Signed)
CLINICAL SOCIAL WORK MATERNAL/CHILD NOTE  Patient Details  Name: Patricia Morse MRN: [id number] Date of Birth: 09/14/1986  Date:  17-Dec-2021  Clinical Social Worker Initiating Note:  Abundio Miu, [place] Date/Time: Initiated:  01/14/22/1145     Child's Name:  Patricia Morse   Biological Parents:  Mother, Father (Father: Patricia Morse)   Need for Interpreter:  None   Reason for Referral:  Behavioral Health Concerns, Other (Comment) Flavia Shipper: 12 ;; Infant's NICU Admission)   Address:  [place] [address]    Phone number:  [phone] (home)     Additional phone number:   Household Members/Support Persons (HM/SP):   Household Member/Support Person 1   HM/SP Name Relationship DOB or Age  HM/SP -1 Patricia Morse FOB/Husband    HM/SP -2        HM/SP -3        HM/SP -4        HM/SP -5        HM/SP -6        HM/SP -7        HM/SP -8          Natural Supports (not living in the home):      Professional Supports: Therapist   Employment: Animator   Type of Work: Psychologist, prison and probation services   Education:  [place] arranged:    Museum/gallery curator Resources:  Multimedia programmer    Other Resources:      Cultural/Religious Considerations Which May Impact Care:    Strengths:  Ability to meet basic needs  , Engineer, materials, Home prepared for child  , Understanding of illness   Psychotropic Medications:         Pediatrician:    [place] area  Pediatrician List:   [place]      Pediatrician Fax Number:    Risk Factors/Current Problems:  Mental Health Concerns     Cognitive State:  Able to Concentrate  , Alert  , Insightful  , Goal Oriented  , Linear Thinking     Mood/Affect:  Comfortable  , Interested  , Calm     CSW Assessment: CSW met with parents at bedside to complete psychosocial assessment,  parents accompanied by visitors. CSW introduced self and explained role. Parents asked visitors to step out, visitors left the room. Parents were welcoming, open, pleasant, and remained engaged during assessment. Parents reported that they reside together along with 2 cats. 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 MOB shared that FOB has 6 weeks off. Parents reported that they have all items needed to care for infant including a car seat, basinet, and crib. CSW inquired about MOB's support system aside from FOB, MOB reported that her mom is a support.   CSW and parents discussed infant's NICU admission. CSW informed parents about the NICU, what to expect, and supports available while infant is admitted to the NICU. Parents reported that they felt informed about infant's care and reassured after speaking with the doctor. Parents denied any transportation barriers with visiting infant in the NICU. Parents denied any questions/concerns regarding the NICU.   CSW provided review of Sudden Infant Death Syndrome (SIDS) precautions.    CSW asked FOB to leave the room to speak with MOB  privately, FOB left the room.  ° °CSW inquired about MOB's mental health history. MOB reported that she was diagnosed with depression at 36 years old and diagnosed with anxiety in 2018. MOB reported that she is currently taking Zoloft and participating in therapy twice a month, which is helpful. MOB shared that her therapist is also a mom. MOB reported that she has a virtual therapy appointment on 3/1. MOB shared that she was less anxious during pregnancy especially during the first two trimesters. MOB reported having some anxiety towards the end pregnancy and attributed it to friends and work. CSW and MOB discussed edinburgh score 12. MOB attributed high edinburgh score to stress related to being induced earlier and having to get the home prepared for visitors. MOB  reported that she is no longer anxious about this. CSW inquired about how MOB was feeling emotionally since giving birth, MOB reported that she was feeling normal then she was tearful when infant went to the NICU and tearful after speaking with the doctor. CSW acknowledged, normalized, and validated MOB's feelings. CSW and MOB discussed emotions associated with NICU admission. MOB presented calm and possessed insight about her mental health. MOB did not demonstrate any acute mental health signs/symptoms. CSW assessed for safety, MOB denied SI, HI, and domestic violence.  ° °CSW provided education regarding the baby blues period vs. perinatal mood disorders, discussed treatment and gave resources for mental health follow up if concerns arise.  CSW recommends self-evaluation during the postpartum time period using the New Mom Checklist from Postpartum Progress and encouraged MOB to contact a medical professional if symptoms are noted at any time.   ° °CSW will continue to offer resources/supports while infant is admitted to the NICU.  ° ° °CSW Plan/Description:  Sudden Infant Death Syndrome (SIDS) Education, Perinatal Mood and Anxiety Disorder (PMADs) Education, Other Patient/Family Education, Psychosocial Support and Ongoing Assessment of Needs  ° ° °Patricia Mahaffy L Sandon Yoho, LCSW °01/14/2022, 11:47 AM °

## 2022-01-14 NOTE — Progress Notes (Signed)
PPD # 1 S/P NSVD  Live born female  Birth Weight: 6 lb 12.3 oz (3070 g) APGAR: 8, 9  Newborn Delivery   Birth date/time: [birth year]2022 20:55:00 Delivery type: Vaginal, Spontaneous     Baby name: Marlou Sa, admitted to NICU for r/o seizure activity  Delivering provider: Gavin Potters K   Episiotomy:None   Lacerations:2nd degree   Circumcision: Yes, planning  Feeding: breast, pumping  Pain control at delivery: Epidural   S:  Reports feeling emotional over baby's admit to NICU early this morning. Good family support, husband and in-laws present. Reports perineal pain unrelieved with Tylenol, requests pain medication.              Tolerating PO/No nausea or vomiting             Bleeding is light             Pain controlled with acetaminophen and narcotic analgesics including oxycodone (Oxycontin, Oxyir)             Up ad lib/ambulatory/voiding without difficulties   O:  A & O x 3, in no apparent distress  Vitals:   01/13/22 2345 01/14/22 0045 01/14/22 0445 01/14/22 1027  BP: 127/68 132/74 128/81 120/75  Pulse: 93 94 72 90  Resp: _0 Temp: 99.1 F (37.3 C) 98.7 F (37.1 C) 98.3 F (36.8 C) 98.1 F (36.7 C)  TempSrc: Oral Oral Oral Oral  SpO2: 98% 98% 99% 99%  Weight:      Height:       Recent Labs    01/13/22 2226 01/14/22 0418  WBC 16.6* 15.3*  HGB 11.8* 10.0*  HCT 34.7* 30.4*  PLT 161 138*    Blood type: --/--/O POS (02/18 0300)  Rubella: Nonimmune (08/17 0000)   I&O: I/O last 3 completed shifts: In: -  Out: 2425 [Urine:2275; Blood:150]          No intake/output data recorded.  Vaccines: TDaP          UTD                    COVID-19 Declines due to allergic reaction   Gen: AAO x 3, NAD  Abdomen: soft, non-tender, non-distended            Fundus: firm, non-tender, U-1  Perineum: repair intact  Lochia: small  Extremities: 1+ non-pitting edema to lower extremities, no calf pain or tenderness   A/P:  PPD # 1 36 y.o., G1P1001  Principal Problem:    Postpartum care following vaginal delivery 2/18  Doing well - stable status  Routine post partum orders Active Problems:   Hearing loss   Depression with anxiety  Continue Zoloft 135m and Buspar 539mas prescribed  Monitor for worsening anxiety/depression with baby in the NICU  Close postpartum F/U   Encounter for induction of labor   Gestational hypertension  BPs WNL after delivery  Labs stable, small drop in platelet count after delivery (138)  Start Procardia XL 3026maily  Discontinue Labetalol 100m21mepeat labs in the AM  Close PP F/U for BP   SVD 2/18   Second degree perineal laceration  Discussed perineal care and comfort measures.    Rubella non-immune  Offer MMR at discharge  AmanSuzan NailerN, CNM 01/14/2022, 11:32 AM

## 2022-01-14 NOTE — Lactation Note (Signed)
This note was copied from a baby's chart. Lactation Consultation Note  Patient Name: Patricia Morse GURKY'H Date: 01/14/2022 Reason for consult: Initial assessment;Primapara;1st time breastfeeding;NICU baby;Early term 37-38.6wks;Other (Comment) (Mom has cchlear implants (40% hearing loss)) Age:34 hours  Visited with [name], she's a P1 and reported (+) breast changes during the pregnancy. Mom has bilateral cochlear implants but she can read lips. She already started pumping but voiced that she hasn't been able to get any drops yet, whether is hand expression or with the pump.   Reviewed pumping schedule, expectations, pumping log and lactogenesis II. Baby currently on Similac 20 calorie formula, mom has attempted to take baby to breast while on MBU but NICU RN Delman Cheadle reported that baby is not stable enough to try at the breast at this point. Will continue to monitor.  Maternal Data  Mom's supply is WNL  Feeding Mother's Current Feeding Choice: Breast Milk and Formula  Lactation Tools Discussed/Used Tools: Pump;Flanges Flange Size: 21;24 Breast pump type: Double-Electric Breast Pump Pump Education: Setup, frequency, and cleaning;Milk Storage Reason for Pumping: ETI in NICU Pumping frequency: 2 times @ 14 hours of age Pumped volume: 0 mL  Interventions Interventions: Breast feeding basics reviewed;DEBP;Education  Plan of care Encouraged mom to continue pumping consistently every 3 hours, at least 8 pumping sessions/24 hours Hand expression and breast massage were also encouraged prior pumping  FOB and visitors present, including former Clinical biochemist, she's the proud GOB. All questions and concerns answered, family to call NICU LC PRN.  Discharge Pump: DEBP;Personal (Spectra at home)  Consult Status Consult Status: Follow-up Date: 01/14/22 Follow-up type: In-patient   Patricia Morse Patricia Morse 01/14/2022, 11:55 AM

## 2022-01-15 DIAGNOSIS — O9902 Anemia complicating childbirth: Secondary | ICD-10-CM | POA: Diagnosis not present

## 2022-01-15 LAB — COMPREHENSIVE METABOLIC PANEL
ALT: 12 U/L (ref 0–44)
AST: 21 U/L (ref 15–41)
Albumin: 2.1 g/dL — ABNORMAL LOW (ref 3.5–5.0)
Alkaline Phosphatase: 120 U/L (ref 38–126)
Anion gap: 9 (ref 5–15)
BUN: 5 mg/dL — ABNORMAL LOW (ref 6–20)
CO2: 22 mmol/L (ref 22–32)
Calcium: 8.1 mg/dL — ABNORMAL LOW (ref 8.9–10.3)
Chloride: 105 mmol/L (ref 98–111)
Creatinine, Ser: 0.63 mg/dL (ref 0.44–1.00)
GFR, Estimated: >60 mL/min (ref >60)
Glucose, Bld: 89 mg/dL (ref 70–99)
Potassium: 3.6 mmol/L (ref 3.5–5.1)
Sodium: 136 mmol/L (ref 135–145)
Total Bilirubin: 0.3 mg/dL (ref 0.3–1.2)
Total Protein: 5 g/dL — ABNORMAL LOW (ref 6.5–8.1)

## 2022-01-15 LAB — CBC
HCT: 28.9 % — ABNORMAL LOW (ref 36.0–46.0)
Hemoglobin: 9.8 g/dL — ABNORMAL LOW (ref 12.0–15.0)
MCH: 30.3 pg (ref 26.0–34.0)
MCHC: 33.9 g/dL (ref 30.0–36.0)
MCV: 89.5 fL (ref 80.0–100.0)
Platelets: 124 10*3/uL — ABNORMAL LOW (ref 150–400)
RBC: 3.23 MIL/uL — ABNORMAL LOW (ref 3.87–5.11)
RDW: 14.4 % (ref 11.5–15.5)
WBC: 10 10*3/uL (ref 4.0–10.5)
nRBC: 0 % (ref 0.0–0.2)

## 2022-01-15 MED ORDER — ALPRAZOLAM 0.25 MG PO TABS
0.5000 mg | ORAL_TABLET | Freq: Three times a day (TID) | ORAL | Status: DC | PRN
  Administered 2022-01-15: 0.5 mg via ORAL
  Filled 2022-01-15: qty 2

## 2022-01-15 MED ORDER — MAGNESIUM OXIDE -MG SUPPLEMENT 400 (240 MG) MG PO TABS
400.0000 mg | ORAL_TABLET | Freq: Every day | ORAL | Status: DC
  Administered 2022-01-15 – 2022-01-17 (×3): 400 mg via ORAL
  Filled 2022-01-15 (×3): qty 1

## 2022-01-15 MED ORDER — POLYSACCHARIDE IRON COMPLEX 150 MG PO CAPS
150.0000 mg | ORAL_CAPSULE | Freq: Every day | ORAL | Status: DC
  Administered 2022-01-15 – 2022-01-17 (×3): 150 mg via ORAL
  Filled 2022-01-15 (×3): qty 1

## 2022-01-15 NOTE — Progress Notes (Signed)
Received phone call from Onnie Graham, RN in NICU that mom was visiting baby and c/o chest pain.  Transferred mom up to her postpartum room at that time, during transport pt became tearful and began to wail and cry. Pt stated she was having chest pain that was sharp and sudden along with pressure in the chest that worsened when she would take in a deep breath.  BP = 142/89, Pulse 98, RR 17, and Sp02 = 99% on room air.  Pt has a hx of anxiety and has had previous attacks but felt this was somewhat different.  Explained that some of her pain could be associated to the anxiety and a mixture of her straining muscles during labor while bearing down and pushing.  Informed Dorisann Frames, CNM of situation, ordered a prn dose of Xanax 0.5mg  and to give that and a dose of the Buspar that was already ordered. Will treat and continue to monitor and notify provider of any changes.

## 2022-01-15 NOTE — Plan of Care (Signed)
Pt stable throughout shift. Pain well-controlled with oral ibuprofen and tylenol. Pt pumping on a regular schedule to provide stimulation for milk production in light of maternal child separation. Pt visited NICU twice this shift, seems encouraged by baby's progress. Continue to monitor and assist as needed.

## 2022-01-15 NOTE — Progress Notes (Signed)
Nurse entered room to give patient medication. Patient was hysterically crying and on the verge of a panic attack. Patient said she is very hormonal. Nurse gave the patient her Zoloft and a PRN dose of Buspirone.

## 2022-01-15 NOTE — Progress Notes (Addendum)
PPD #2 S/P NSVD  Live born female  Birth Weight: 6 lb 12.3 oz (3070 g) APGAR: 8, 9  Newborn Delivery   Birth date/time: [birth year]/2022 20:55:00 Delivery type: Vaginal, Spontaneous     Baby name: Marlou Sa, admitted to NICU for monitoring, likely SSRI withdrawal per neo  Delivering provider: Gavin Potters K   Episiotomy:None   Lacerations:2nd degree   Circumcision: Yes, planning  Feeding: breast, pumping; baby is gavage feeding  Pain control at delivery: Epidural   S:  Reports feeling highly emotional over baby's admit to NICU. Frequent crying spells. Good family support. MIL in NICU room with patient, was a NICU nurse. Reports perineal pain but improves with Oxy. Pumping breastmilk.              Tolerating PO/No nausea or vomiting             Bleeding is light             Pain controlled with ibuprofen (OTC) and narcotic analgesics including oxycodone (Oxycontin, Oxyir)             Up ad lib/ambulatory/voiding without difficulties   O:  A & O x 3, in no apparent distress  Vitals:   01/14/22 1027 01/14/22 1430 01/14/22 2040 01/14/22 2300  BP: 120/75 129/79 131/87 134/84  Pulse: 90 81 99 93  Resp: _0 Temp: 98.1 F (36.7 C) 98.2 F (36.8 C) 98.3 F (36.8 C)   TempSrc: Oral Oral Oral   SpO2: 99% 100% 100%   Weight:      Height:       Recent Labs    01/14/22 0418 01/15/22 0419  WBC 15.3* 10.0  HGB 10.0* 9.8*  HCT 30.4* 28.9*  PLT 138* 124*    Blood type: --/--/O POS (02/18 0300)  Rubella: Nonimmune (08/17 0000)   I&O: I/O last 3 completed shifts: In: -  Out: 1000 [Urine:850; Blood:150]          No intake/output data recorded.  Vaccines: TDaP          UTD                    COVID-19 Declined due to reaction   Gen: AAO x 3, NAD  Abdomen: soft, non-tender, non-distended            Fundus: firm, non-tender, U-1  Perineum: repair intact  Lochia: small  Extremities: 1+ non-pitting edema, no calf pain or tenderness   A/P:  PPD # 2 36 y.o., G1P1001  Principal  Problem:   Postpartum care following vaginal delivery 2/18  Doing well - stable status  Routine post partum orders Active Problems:   Hearing loss   Depression with anxiety  Continue Zoloft 123m and Buspar 565m Monitor for worsening anxiety/depression with baby in the NICU            Close postpartum F/U   Encounter for induction of labor   Gestational hypertension  Labs this AM reassuring  BPs WNL  Continue Procardia XL 3019maily  Close PP F/U for BP check   SVD 2/18   Second degree perineal laceration  Discussed perineal care and comfort measures.   Oxy for pain   Rubella non-immune  Offer MMR at discharge   Maternal anemia, with delivery  Asymptomatic  Start PO Niferex and mag oxide  Anticipate discharge tomorrow.   AmaSuzan NailerSN, CNM 01/15/2022, 12:52 PM

## 2022-01-15 NOTE — Lactation Note (Signed)
This note was copied from a baby's chart. Lactation Consultation Note Mother holding infant in NICU and emotional. NICU RN requested that I not consult with mother at this time. Will plan f/u visit at a later time. Mother is pumping, per RN.  Patient Name: Patricia Morse WNUUV'O Date: 01/15/2022   Age:34 hours  Feeding Nipple Type: Nfant Extra Slow Flow (gold)   Elder Negus 01/15/2022, 6:33 PM

## 2022-01-16 LAB — COMPREHENSIVE METABOLIC PANEL
ALT: 13 U/L (ref 0–44)
AST: 18 U/L (ref 15–41)
Albumin: 2.2 g/dL — ABNORMAL LOW (ref 3.5–5.0)
Alkaline Phosphatase: 114 U/L (ref 38–126)
Anion gap: 7 (ref 5–15)
BUN: 5 mg/dL — ABNORMAL LOW (ref 6–20)
CO2: 24 mmol/L (ref 22–32)
Calcium: 8.1 mg/dL — ABNORMAL LOW (ref 8.9–10.3)
Chloride: 106 mmol/L (ref 98–111)
Creatinine, Ser: 0.51 mg/dL (ref 0.44–1.00)
GFR, Estimated: >60 mL/min (ref >60)
Glucose, Bld: 100 mg/dL — ABNORMAL HIGH (ref 70–99)
Potassium: 3.4 mmol/L — ABNORMAL LOW (ref 3.5–5.1)
Sodium: 137 mmol/L (ref 135–145)
Total Bilirubin: 0.2 mg/dL — ABNORMAL LOW (ref 0.3–1.2)
Total Protein: 5.3 g/dL — ABNORMAL LOW (ref 6.5–8.1)

## 2022-01-16 LAB — CBC
HCT: 30.6 % — ABNORMAL LOW (ref 36.0–46.0)
Hemoglobin: 10 g/dL — ABNORMAL LOW (ref 12.0–15.0)
MCH: 29.4 pg (ref 26.0–34.0)
MCHC: 32.7 g/dL (ref 30.0–36.0)
MCV: 90 fL (ref 80.0–100.0)
Platelets: 145 10*3/uL — ABNORMAL LOW (ref 150–400)
RBC: 3.4 MIL/uL — ABNORMAL LOW (ref 3.87–5.11)
RDW: 14.1 % (ref 11.5–15.5)
WBC: 8.6 10*3/uL (ref 4.0–10.5)
nRBC: 0 % (ref 0.0–0.2)

## 2022-01-16 MED ORDER — LABETALOL HCL 100 MG PO TABS
100.0000 mg | ORAL_TABLET | Freq: Two times a day (BID) | ORAL | Status: DC
  Administered 2022-01-16 – 2022-01-17 (×3): 100 mg via ORAL
  Filled 2022-01-16 (×3): qty 1

## 2022-01-16 MED ORDER — LORATADINE 10 MG PO TABS
10.0000 mg | ORAL_TABLET | Freq: Every day | ORAL | Status: DC
Start: 2022-01-16 — End: 2022-01-17
  Filled 2022-01-16: qty 1

## 2022-01-16 NOTE — Progress Notes (Addendum)
Pt d/w CNM Jones. BPs reviewed. Gestational HTN and on procardia 30mg  XL since delivery. Control needs titration, add Labetalol 100mg  po bid, will also help elevated pulse. Recommend stay in hospital today, POD 2 for better HTN control. Patient agrees.  BP (!) 147/83    Pulse (!) 101    Temp 98 F (36.7 C)    Resp 20    Ht 5\' 5"  (1.651 m)    Wt 103.9 kg    SpO2 99%    Breastfeeding Unknown    BMI 38.12 kg/m  Labs reviewed, nl CBC, CMP   PP extended stay to monitor and control PP GHTN  - MD

## 2022-01-16 NOTE — Lactation Note (Signed)
This note was copied from a baby's chart. Lactation Consultation Note  Patient Name: Patricia Morse Date: 01/16/2022 Reason for consult: Follow-up assessment;1st time breastfeeding;NICU baby;Early term 37-38.6wks;Hyperbilirubinemia;Other (Comment) (mom has cochlear implants (40% hearing loss)) Age:34 hours  Mom had concerns about milk volume. States she is only seeing droplets, pumps 4-5x per day, and missed overnight pumping sessions due to hearing impairment. Mom voiced that she hasn't been pumping as frequently because carpal tunnel syndrome makes it too painful for her. Provided her with a pumping top size L to assist w/ pumping. Educated on the importance of pumping 8x a day in 24hrs, including overnight, to stimulate breast and encourage milk production. [employer] [employer] student also brought symphony pump in the room. Mom wishes to start pumping in baby's room.  Maternal Data Does the patient have breastfeeding experience prior to this delivery?: No  Feeding Mother's Current Feeding Choice: Breast Milk and Formula Nipple Type: Nfant Extra Slow Flow (gold) (gold and purple used)   Lactation Tools Discussed/Used Tools: Pump Breast pump type: Double-Electric Breast Pump Pump Education: Setup, frequency, and cleaning;Milk Storage Reason for Pumping: ETI in NICU Pumping frequency: 4x-5x a day Pumped volume:  (droplets)  Interventions Interventions: Breast feeding basics reviewed;Skin to skin;DEBP;Education  Plan of Care Encouraged mom to pump every 3hrs, 8x/24hrs Father of baby will wake mom up overnight to ensure she pumps since she can't hear alarm clock  FOB baby present. All questions and concerns answered. Parents to call NICU LC if needed before next visit.    Discharge Pump: DEBP;Personal (Spectra S1)  Consult Status Consult Status: Follow-up Date: 01/16/22 Follow-up type: In-patient    Patricia Morse 01/16/2022, 12:06 PM

## 2022-01-16 NOTE — Progress Notes (Signed)
PPD #3 S/P NSVD  Live born female  Birth Weight: 6 lb 12.3 oz (3070 g) APGAR: 8, 9  Newborn Delivery   Birth date/time: [birth year]2022 20:55:00 Delivery type: Vaginal, Spontaneous     Baby name: Patricia Morse, in NICU for SSRI withdrawal, late preterm  Delivering provider: Gavin Potters Morse   Episiotomy:None   Lacerations:2nd degree   Circumcision Yes, planning prior to discharge  Feeding: breast, pumping, baby is gavage feeding in NICU  Pain control at delivery: Epidural   S:  Reports feeling new onset headache early this AM. Reports improvement in headache after Tylenol and Motrin. Anxiety is slightly improved this morning. Took Xanax yesterday evening after panic attack in NICU. Husband at the bedside.              Tolerating PO/No nausea or vomiting             Bleeding is light             Pain controlled with acetaminophen and ibuprofen (OTC)             Up ad lib/ambulatory/voiding without difficulties   O:  A & O x 3, in no apparent distress  Vitals:   01/15/22 2246 01/15/22 2328 01/16/22 0744 01/16/22 0854  BP: 138/90 132/82 (!) 141/89 (!) 147/83  Pulse: (!) 101 91 96 (!) 101  Resp: 18   20  Temp: 98.5 F (36.9 C)  97.7 F (36.5 C) 98 F (36.7 C)  TempSrc: Oral     SpO2:   99% 99%  Weight:      Height:       Recent Labs    01/15/22 0419 01/16/22 0815  WBC 10.0 8.6  HGB 9.8* 10.0*  HCT 28.9* 30.6*  PLT 124* 145*    Blood type: --/--/O POS (02/18 0300)  Rubella: Nonimmune (08/17 0000)   Vaccines: TDaP          UTD                    COVID-19 Declined   Gen: AAO x 3, NAD  Abdomen: soft, non-tender, non-distended            Fundus: firm, non-tender, U-2  Perineum: repair intact  Lochia: small  Extremities: 1+ non-pitting edema, no calf pain or tenderness   A/P:  PPD # 3 36 y.o., G1P1001  Principal Problem:   Postpartum care following vaginal delivery 2/18  Routine post partum orders Active Problems:   Hearing loss   Depression with anxiety  Increased  anxiety with baby in NICU  Continue Zoloft 162m daily and Buspar 563mTID  Xanax 0.63m43mID as needed  Social work following   Close PP F/U in office   Encounter for induction of labor   Gestational hypertension  BPs mild range since last evening with new onset headache  Labs this AM reassuring  Continue Procardia XL 62m763mily  Add Labetalol 100mg263m after consult with Dr. Mody Patricia Karvonenain inpatient today for blood pressure monitoring  Close PP F/U for blood pressure   SVD 2/18   Second degree perineal laceration  Discussed perineal care and comfort measures.    Rubella non-immune  Offer MMR at discharge   Maternal anemia, with delivery  Continue PO Niferex and mag oxide  Anticipate discharge tomorrow. Follow-up in office on Friday with CNM.    Patricia Morse, CNM 01/16/2022, 10:01 AM

## 2022-01-16 NOTE — Progress Notes (Signed)
CSW followed up with parents at bedside to offer support and assess for needs, concerns, and resources; FOB was holding infant and MOB was sitting next to FOB. CSW inquired about how parents were doing, MOB reported that she was doing better today. MOB shared that yesterday was rough, noting that infant had to get an NG tube and she was present for the procedure. Parents spoke about multiple factors attributing to MOB's anxiety including infant's health, MOB's health, and communicating with friends. CSW acknowledged, normalized, and validated MOB's feelings. MOB shared that she received Xanax yesterday which was helpful. CSW and parents discussed emotions associated with NICU admission. CSW inquired about anything that CSW could do to be helpful, MOB reported no needs. CSW provided CSW contact information again and encouraged parents to contact CSW if any needs/concerns arise.   CSW will continue to offer support and resources to family while infant remains in NICU.    Celso Sickle, LCSW Clinical Social Worker [facility] Cell#: [phone]

## 2022-01-17 ENCOUNTER — Ambulatory Visit: Payer: Self-pay

## 2022-01-17 MED ORDER — POLYSACCHARIDE IRON COMPLEX 150 MG PO CAPS: 150.0000 mg | ORAL_CAPSULE | Freq: Every day | ORAL | Status: DC

## 2022-01-17 MED ORDER — ACETAMINOPHEN 500 MG PO TABS: 1000.0000 mg | ORAL_TABLET | Freq: Four times a day (QID) | ORAL | 2 refills | Status: AC | PRN

## 2022-01-17 MED ORDER — NIFEDIPINE ER 30 MG PO TB24: 30.0000 mg | ORAL_TABLET | Freq: Every day | ORAL | 0 refills | Status: DC

## 2022-01-17 MED ORDER — MAGNESIUM OXIDE -MG SUPPLEMENT 400 (240 MG) MG PO TABS: 400.0000 mg | ORAL_TABLET | Freq: Every day | ORAL | Status: DC

## 2022-01-17 MED ORDER — OXYCODONE HCL 5 MG PO TABS
5.0000 mg | ORAL_TABLET | Freq: Four times a day (QID) | ORAL | 0 refills | Status: AC | PRN
Start: 2022-01-17 — End: 2022-01-22

## 2022-01-17 MED ORDER — COCONUT OIL OIL
1.0000 | TOPICAL_OIL | 0 refills | Status: DC | PRN
Start: 2022-01-17 — End: 2023-04-02

## 2022-01-17 MED ORDER — SERTRALINE HCL 100 MG PO TABS: 100.0000 mg | ORAL_TABLET | Freq: Every day | ORAL | 0 refills | Status: DC

## 2022-01-17 MED ORDER — LABETALOL HCL 100 MG PO TABS: 100.0000 mg | ORAL_TABLET | Freq: Two times a day (BID) | ORAL | 0 refills | Status: DC

## 2022-01-17 MED ORDER — IBUPROFEN 600 MG PO TABS: 600.0000 mg | ORAL_TABLET | Freq: Four times a day (QID) | ORAL | 0 refills | Status: DC

## 2022-01-17 MED ORDER — BENZOCAINE-MENTHOL 20-0.5 % EX AERO
1.0000 | INHALATION_SPRAY | CUTANEOUS | Status: DC | PRN
Start: 2022-01-17 — End: 2023-03-18

## 2022-01-17 NOTE — Discharge Summary (Signed)
OB Discharge Summary  Patient Name: Patricia Morse DOB: 10/12/1986 MRN: [id number]  Date of admission: 01/13/2022 Delivering provider: Gavin Potters K   Admitting diagnosis: Encounter for induction of labor [Z34.90] Intrauterine pregnancy: [redacted]w[redacted]d     Secondary diagnosis: Patient Active Problem List   Diagnosis Date Noted   Maternal anemia, with delivery 01/15/2022   Encounter for induction of labor 01/13/2022   Gestational hypertension 01/13/2022   SVD 2/18 01/13/2022   Postpartum care following vaginal delivery 2/18 01/13/2022   Second degree perineal laceration 01/13/2022   Rubella non-immune 01/13/2022   Depression with anxiety 03/17/2020   Hearing loss 10/10/2012   Additional problems:none   Date of discharge: 01/17/2022   Discharge diagnosis: Principal Problem:   Postpartum care following vaginal delivery 2/18 Active Problems:   Hearing loss   Depression with anxiety   Encounter for induction of labor   Gestational hypertension   SVD 2/18   Second degree perineal laceration   Rubella non-immune   Maternal anemia, with delivery                                                              Post partum procedures: none  Augmentation: AROM, Pitocin, Cytotec, and IP Foley Pain control: Epidural  Laceration:2nd degree  Episiotomy:None  Complications: None  Hospital course:  Induction of Labor With Vaginal Delivery   36 y.o. yo G1P1001 at [redacted]w[redacted]d was admitted to the hospital 01/13/2022 for induction of labor.  Indication for induction: Gestational hypertension.  Patient had an uncomplicated labor course as follows: Membrane Rupture Time/Date: 1:08 PM ,01/13/2022   Delivery Method:Vaginal, Spontaneous  Episiotomy: None  Lacerations:  2nd degree  Details of delivery can be found in separate delivery note.  Patient had a postpartum course complicated by increased anxiety, controlled with Zoloft, BuSpar and PRN Xanax. Blood pressure was controlled on Procardia 30XL until  postpartum day 2 then increase to mild range and labetalol 100 mg BIT added. This was effective in conjunction with anxiolytic use. She remained inpatient until postpartum day 4 for blood pressure control. Preeclamptic lab work up remained normal. Patient is discharged home 01/17/22. Close follow up in office in 2 days.   Newborn Data: Birth date:01/13/2022  Birth time:8:55 PM  Gender:Female  Living status:Living  Apgars:8 ,9  Weight:3070 g   Physical exam  Vitals:   01/16/22 1743 01/16/22 1944 01/16/22 2203 01/17/22 0345  BP: 130/85 133/86 136/83 125/81  Pulse: (!) 101 90 (!) 101 83  Resp:      Temp:      TempSrc:      SpO2:      Weight:      Height:       General: alert, cooperative, and no distress Lochia: appropriate Uterine Fundus: firm Incision: N/A Perineum: repair intact, no edema, + inflamed hemorrhoids DVT Evaluation: No cords or calf tenderness. Calf/Ankle edema is present Labs: Lab Results  Component Value Date   WBC 8.6 01/16/2022   HGB 10.0 (L) 01/16/2022   HCT 30.6 (L) 01/16/2022   MCV 90.0 01/16/2022   PLT 145 (L) 01/16/2022   CMP Latest Ref Rng & Units 01/16/2022  Glucose 70 - 99 mg/dL 100(H)  BUN 6 - 20 mg/dL 5(L)  Creatinine 0.44 - 1.00 mg/dL 0.51  Sodium 135 - 145  mmol/L 137  Potassium 3.5 - 5.1 mmol/L 3.4(L)  Chloride 98 - 111 mmol/L 106  CO2 22 - 32 mmol/L 24  Calcium 8.9 - 10.3 mg/dL 8.1(L)  Total Protein 6.5 - 8.1 g/dL 5.3(L)  Total Bilirubin 0.3 - 1.2 mg/dL 0.2(L)  Alkaline Phos 38 - 126 U/L 114  AST 15 - 41 U/L 18  ALT 0 - 44 U/L 13   Edinburgh Postnatal Depression Scale Screening Tool 01/13/2022  I have been able to laugh and see the funny side of things. 0  I have looked forward with enjoyment to things. 0  I have blamed myself unnecessarily when things went wrong. 1  I have been anxious or worried for no good reason. 3  I have felt scared or panicky for no good reason. 3  Things have been getting on top of me. 2  I have been so  unhappy that I have had difficulty sleeping. 1  I have felt sad or miserable. 1  I have been so unhappy that I have been crying. 1  The thought of harming myself has occurred to me. 0  Edinburgh Postnatal Depression Scale Total 12   Vaccines: TDaP          declined        COVID-19   declined        Flu             declined  Discharge instruction:  per After Visit Summary,  [facility] booklet and  "Understanding Mother & Baby Care" hospital booklet  After Visit Meds:  Allergies as of 01/17/2022       Reactions   Contrast Media [iodinated Contrast Media] Itching   Swelling, high heart rate   Amoxicillin Hives   Hydrocodeine [dihydrocodeine] Rash   Penicillins Rash        Medication List     TAKE these medications    acetaminophen 500 MG tablet Commonly known as: TYLENOL Take 2 tablets (1,000 mg total) by mouth every 6 (six) hours as needed.   benzocaine-Menthol 20-0.5 % Aero Commonly known as: DERMOPLAST Apply 1 application topically as needed for irritation (perineal discomfort).   busPIRone 5 MG tablet Commonly known as: BUSPAR Take 5 mg by mouth 3 (three) times daily as needed.   cetirizine 10 MG tablet Commonly known as: ZYRTEC Take 10 mg by mouth daily.   coconut oil Oil Apply 1 application topically as needed.   ibuprofen 600 MG tablet Commonly known as: ADVIL Take 1 tablet (600 mg total) by mouth every 6 (six) hours.   iron polysaccharides 150 MG capsule Commonly known as: Ferrex 150 Take 1 capsule (150 mg total) by mouth daily.   labetalol 100 MG tablet Commonly known as: NORMODYNE Take 1 tablet (100 mg total) by mouth 2 (two) times daily. What changed: when to take this   magnesium oxide 400 (240 Mg) MG tablet Commonly known as: MAG-OX Take 1 tablet (400 mg total) by mouth daily. For prevention of constipation.   NIFEdipine 30 MG 24 hr tablet Commonly known as: ADALAT CC Take 1 tablet (30 mg total) by mouth daily.   oxyCODONE 5 MG  immediate release tablet Commonly known as: Oxy IR/ROXICODONE Take 1 tablet (5 mg total) by mouth every 6 (six) hours as needed for up to 5 days for moderate pain.   prenatal multivitamin Tabs tablet Take 1 tablet by mouth daily at 12 noon.   PROBIOTIC DAILY PO Take by mouth.   sertraline 100  MG tablet Commonly known as: ZOLOFT Take 1 tablet (100 mg total) by mouth daily.   valACYclovir 1000 MG tablet Commonly known as: VALTREX Take 0.5-1 tablets (500-1,000 mg total) by mouth daily. Use as needed for suppression               Discharge Care Instructions  (From admission, onward)           Start     Ordered   01/17/22 0000  Discharge wound care:       Comments: Sitz baths 2 times /day with warm water x 1 week. May add herbals: 1 ounce dried comfrey leaf* 1 ounce calendula flowers 1 ounce lavender flowers  Supplies can be found online at [employer] sources at [employer], [employer]  1/2 ounce dried uva ursi leaves 1/2 ounce witch hazel blossoms (if you can find them) 1/2 ounce dried sage leaf 1/2 cup sea salt Directions: Bring 2 quarts of water to a boil. Turn off heat, and place 1 ounce (approximately 1 large handful) of the above mixed herbs (not the salt) into the pot. Steep, covered, for 30 minutes.  Strain the liquid well with a fine mesh strainer, and discard the herb material. Add 2 quarts of liquid to the tub, along with the 1/2 cup of salt. This medicinal liquid can also be made into compresses and peri-rinses.   01/17/22 0955            Diet: iron rich diet  Activity: Advance as tolerated. Pelvic rest for 6 weeks.   Postpartum contraception: TBA in office  Newborn Data: Live born female  Birth Weight: 6 lb 12.3 oz (3070 g) APGAR: 8, 9  Newborn Delivery   Birth date/time: 01/13/2022 20:55:00 Delivery type: Vaginal, Spontaneous      named Dean Baby Feeding: Bottle and Breast Disposition:NICU Circumcision: planned prior to  discharge   Delivery Report:   Review the Delivery Report for details.    Follow up:  Follow-up Information     Suzan Nailer, CNM. Schedule an appointment as soon as possible for a visit on 01/19/2022.   Specialty: Certified Nurse Midwife Contact information: [address] [place] [phone]                   Signed: Otilio Carpen, MSN 01/17/2022, 9:56 AM

## 2022-01-17 NOTE — Lactation Note (Signed)
This note was copied from a baby's chart. Lactation Consultation Note  Patient Name: Patricia Morse [name] [id number] Date: 01/17/2022 Reason for consult: Follow-up assessment;Other (Comment);NICU baby;1st time breastfeeding;Primapara;Early term 37-38.6wks (mom has cochlear implants, 40% hearing loss) Age:34 days  Visited with mom of 58 hours old ETI NICU female, mom got discharged today, reviewed discharge education, pumping schedule, guidelines and expectations. Mom is still not pumping consistently, SLP Dacia assisted with a feeding earlier today using a NS # 20 and reported mom is feeling overwhelmed with NICU stay. Reviewed feeding cues and ETI behavior; baby asleep in GOB arms during [facility] consultation.  Mom had questions regarding her flanges, she voiced that her Spectra at home does not have the same flange sizes as her Medela pump. FOB has already ordered additional parts in case mom needs inserts. Flanges # 21 seem to be appropriate at this time, maybe with slightly room to spare. Suggested to mom to try to inserts once she goes home and report back to [facility].    Maternal Data  Mom's supply is low, she still hasn't experienced the onset of lactogenesis II @ 89 hours post-partum  Feeding Mother's Current Feeding Choice: Breast Milk and Formula Nipple Type: Slow - flow  Lactation Tools Discussed/Used Tools: Pump;Flanges Flange Size: 21 Breast pump type: Double-Electric Breast Pump Pump Education: Setup, frequency, and cleaning;Milk Storage Reason for Pumping: ETI in NICU Pumping frequency: 4 times/24 hours Pumped volume:  (drops)  Interventions Interventions: Breast feeding basics reviewed;DEBP;Education  Plan of care Encouraged mom to try pumping consistently every 3 hours, at least 8 pumping sessions/24 hours She'll put baby to breast on feeding cues and will call for assistance when needed   GOB (maternal) present; both GOB are former RNs. All questions and concerns answered, family  to call NICU LC PRN.   Discharge Discharge Education: Engorgement and breast care Pump: DEBP;Personal (Spectra S1)  Consult Status Consult Status: Follow-up Date: 01/17/22 Follow-up type: In-patient   Declin Rajan Venetia Constable 01/17/2022, 2:11 PM

## 2022-01-17 NOTE — Discharge Instructions (Signed)
Lactation outpatient support - home visit ° ° °Jessica Bowers, IBCLC (lactation consultant)  & Birth Doula ° °Phone (text or call): [phone] °Email: [email or web address] °[email or web address] ° ° °Linda Coppola °RN, MHA, IBCLC °at [facility]: Lactation Consultant ° °[email or web address] °Mail: [email or web address] °Tel: [phone] ° ° °Additional breastfeeding resources: ° °[facility] °https://ibconline.ca/information-sheets/ ° °[employer] [facility] [place] ° °www.lllofnc.org ° ° °Other Resources: ° °Chiropractic specialist  ° °Dr. Leanna Hastings °[email or web address] ° ° °Craniosacral therapy for baby ° °Erin Balkind  °[email or web address]://[email or web address] ° °

## 2022-01-18 ENCOUNTER — Ambulatory Visit: Payer: Self-pay

## 2022-01-18 NOTE — Lactation Note (Signed)
This note was copied from a baby's chart.  NICU Lactation Consultation Note  Patient Name: Patricia Morse [id number] Date: 01/18/2022 Age:34 days  Subjective Reason for consult: Follow-up assessment; Mother's request; Primapara; 1st time breastfeeding; Difficult latch; NICU baby  Lactation followed up and assisted with placement of the nipple shield and latching baby in cradle hold on the right breast. Rhythmic suckling sequences noted. Parents were educated on how to support baby at the breast and signs that baby has had enough to eat. I recommended post pumping and to continue with supplementation in addition to breast feeding. Maternal milk is transitioning, but just "came in" on 2/23 (day before) and is not yet at baby's feeding volume.   Lactation will plan for follow up prior to discharge.  Objective Infant data: Mother's Current Feeding Choice: Breast Milk and Formula  Infant feeding assessment Scale for Readiness: 1 Scale for Quality: 2  Maternal data: G1P1001  Vaginal, Spontaneous Current breast feeding challenges:: NICU   Does the patient have breastfeeding experience prior to this delivery?: No  Pumping frequency: rec post pump with feedings; q3 hours Pumped volume: 45 mL Flange Size: 21   Pump: Personal (Spectra)  Assessment Infant: LATCH Score: 9  Feeding Status: Ad lib   Maternal: Milk volume: Normal  Intervention/Plan Interventions: Breast feeding basics reviewed; Assisted with latch; Hand express; Adjust position; Support pillows; Education; [employer] brochure Offer breast with feedings; pump after; supplement by bottle after each breast feeding or every other feeding (in place of breast feeding); offer both breasts in a feeding  Tools: Nipple Shields Pump Education: Setup, frequency, and cleaning; Milk Storage Nipple shield size: 20  Plan: Consult Status: Follow-up  NICU Follow-up type: Verify onset of copious milk; Verify absence of  engorgement; Assist with IDF-2 (Mother does not need to pre-pump before breastfeeding)    Walker Shadow 01/18/2022, 5:50 PM

## 2022-01-18 NOTE — Lactation Note (Signed)
This note was copied from a baby's chart.  NICU Lactation Consultation Note  Patient Name: Patricia Morse [id number] Date: 01/18/2022 Age:34 days   Subjective Reason for consult: Follow-up assessment; NICU baby; Primapara; 1st time breastfeeding  Lactation followed up with Patricia Morse. Upon entry, they were bottle feeding baby Patricia Morse after an attempt at the breast. Patricia Morse reports that her milk began to transition on 2/23 and she endorses leaking.  I helped her with the DEBP while dad offered Patricia Morse a bottle. Transitional milk noted and volume was WNL. I recommended pumping q3 hours.  Patricia Morse is interested in breast feeding support. I indicated that I could return at her next feeding to assist.  Objective Infant data: Mother's Current Feeding Choice: Breast Milk and Formula  Infant feeding assessment Scale for Readiness: 1 Scale for Quality: 2    Maternal data: G1P1001  Vaginal, Spontaneous  Current breast feeding challenges:: NICU; first time breast feeding   Does the patient have breastfeeding experience prior to this delivery?: No  Pumping frequency: q3 hours Pumped volume: 20 mL (up to 30 mls) Flange Size: 21   Pump: Personal (Spectra)  Assessment Infant: LATCH Score: 6  Feeding Status: Ad lib   Maternal: Milk volume: Low   Intervention/Plan Interventions: Support pillows; Breast feeding basics reviewed; Education; DEBP  Tools: Pump; Flanges Pump Education: Setup, frequency, and cleaning; Milk Storage  Plan: Consult Status: Follow-up  NICU Follow-up type: Assist with IDF-2 (Mother does not need to pre-pump before breastfeeding)    Patricia Morse 01/18/2022, 3:00 PM

## 2022-01-19 ENCOUNTER — Ambulatory Visit: Payer: Self-pay

## 2022-01-19 MED ORDER — SERTRALINE HCL 100 MG PO TABS: 100.0000 mg | ORAL_TABLET | Freq: Every day | ORAL | 3 refills | Status: DC

## 2022-01-19 NOTE — Lactation Note (Signed)
This note was copied from a baby's chart.  NICU Lactation Consultation Note  Patient Name: Boy Dabney Dever [id number] Date: 01/19/2022 Age:34 days   Subjective Reason for consult: Follow-up assessment; Primapara; 1st time breastfeeding; NICU baby; Early term 37-38.6wks; Late-preterm 34-36.6wks  Lactation briefly followed up with parents of baby [name]. They stated that mom successfully breast fed [name] for 20 minutes (10 minutes/side), and they are feeling more confident with their progress. They have a [employer] [place] [employer] pillow at home to assist with positioning.  I sent an OP referral to S. Hice and requested a follow up call after discharge. Parents anticipate discharge today.  Objective Infant data: Mother's Current Feeding Choice: Breast Milk and Formula  Infant feeding assessment Scale for Readiness: 1 Scale for Quality: 2     Maternal data: G1P1001  Vaginal, Spontaneous No data recorded Current breast feeding challenges:: NICU  No data recorded Does the patient have breastfeeding experience prior to this delivery?: No  Pumping frequency: rec post pump with feedings; q3 hours Pumped volume: 45 mL  No data recorded  Pump: Personal (Spectra)  Assessment Infant: LATCH Score: 9  Feeding Status: Ad lib   Maternal: Milk volume: Normal   Intervention/Plan Interventions: [employer] brochure; Education  Tools: Nipple Shields Pump Education: Setup, frequency, and cleaning; Milk Storage Nipple shield size: 20  Plan: Consult Status: Complete  NICU Follow-up type: Verify onset of copious milk; Verify absence of engorgement; Assist with IDF-2 (Mother does not need to pre-pump before breastfeeding)    Walker Shadow 01/19/2022, 2:16 PM

## 2022-01-23 ENCOUNTER — Telehealth (HOSPITAL_COMMUNITY): Payer: Self-pay

## 2022-01-23 NOTE — Telephone Encounter (Signed)
No answer. Left voicemail to return nurse call.  Signe Colt 01/23/2022,1835

## 2022-01-24 ENCOUNTER — Telehealth (HOSPITAL_COMMUNITY): Payer: Self-pay | Admitting: *Deleted

## 2022-01-25 NOTE — Telephone Encounter (Addendum)
Pt and husband returned call. Mom reports feeling good. No concerns about herself at this time. EPDS=8 [facility] score=12) ?Mom reports baby is doing well. Feeding, peeing, and pooping without difficulty. Safe sleep reviewed. Mom reports no concerns about baby at present. ? ?Duffy Rhody, RN 01-25-2022 at 8:57am ?

## 2022-02-03 ENCOUNTER — Inpatient Hospital Stay (HOSPITAL_COMMUNITY): Admit: 2022-02-03 | Payer: BC Managed Care – PPO | Admitting: Obstetrics and Gynecology

## 2022-03-14 ENCOUNTER — Encounter: Payer: Self-pay | Admitting: Family Medicine

## 2022-03-14 MED ORDER — VALACYCLOVIR HCL 1 G PO TABS: ORAL_TABLET | ORAL | 1 refills | Status: DC

## 2022-06-11 ENCOUNTER — Encounter: Payer: Self-pay | Admitting: Family Medicine

## 2022-06-11 DIAGNOSIS — F418 Other specified anxiety disorders: Secondary | ICD-10-CM

## 2022-06-11 MED ORDER — SERTRALINE HCL 100 MG PO TABS: 200.0000 mg | ORAL_TABLET | Freq: Every day | ORAL | 3 refills | Status: DC

## 2022-06-21 ENCOUNTER — Ambulatory Visit: Payer: BC Managed Care – PPO | Admitting: Registered"

## 2022-08-07 ENCOUNTER — Other Ambulatory Visit: Payer: Self-pay | Admitting: Orthopedic Surgery

## 2022-08-10 NOTE — Progress Notes (Unsigned)
Healthcare at [facility] [address] [place], [place] [address] [phone]  Date:  08/13/2022   Name:  Patricia Morse   DOB:  1986/11/26   MRN:  [id number]  PCP:  Pearline Cables, MD    Chief Complaint: No chief complaint on file.   History of Present Illness:  Patricia Morse is a 36 y.o. very pleasant female patient who presents with the following:  Virtual visit today to discuss post- partum sx Pt location is home, my location is office Pt and myself are on the visit today-patient and confirmed with 2 factors, she gives consent for virtual visit today  She delivered her son Patricia Morse in March of this year and is taking Zoloft 200 mg- she continued to use this during her pregnancy.  Unfortunately, she notes that she seems to be suffering from postpartum depression She is feeling depressed, amotivated, lacking get the energy to get things done Her son is 45 months old; they may need to do a helmet for his head shape but otherwise he is doing very well Her son is a good sleeper and eats well  She did nurse for a while but it did not work well for them- she pumped her milk for 10 weeks and then changed over to formula  She has used cymbalta and prozac in the past, and took trintillix for a little while  She feels like cymbalta was a good drug for her; she would like to go back on this if possible  [name] she does have a psychiatry appointment coming up in about a month, but does not want to wait that long to make a change.  She denies any suicidal ideation or other dangerous thoughts-such as harming the baby   Patient Active Problem List   Diagnosis Date Noted   Maternal anemia, with delivery 01/15/2022   Encounter for induction of labor 01/13/2022   Gestational hypertension 01/13/2022   SVD 2/18 01/13/2022   Postpartum care following vaginal delivery 2/18 01/13/2022   Second degree perineal laceration  01/13/2022   Rubella non-immune 01/13/2022   Depression with anxiety 03/17/2020   Hearing loss 10/10/2012    Past Medical History:  Diagnosis Date   Allergy    Auditory neuropathy BILATERAL   Bilateral sensorineural hearing loss    SECONDARY AUDITORY NEUROPATHY   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     Depression    History of encephalitis    unsure if it was encephalitis or guillian barre  no flu shots   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;  Service: Orthopedics;  Laterality: Right;   COCHLEAR IMPLANT  11-15-2005  (AGE 64)   LEFT EAR  (CLARION HIGH-RESOLUTION 90K MULTICHANNEL,  1-J ELECTRODE)   COCHLEAR IMPLANT Right 11/12/2018   Procedure: COCHLEAR IMPLANT RIGHT EAR;  Surgeon: Ermalinda Barrios, MD;  Location: [facility];  Service: ENT;  Laterality: Right;   KNEE ARTHROSCOPY  03/13/2012   Procedure: ARTHROSCOPY KNEE;  Surgeon: Shelda Pal, MD;  Location: [facility];  Service: Orthopedics;  Laterality: Right;  RIGHT KNEE DIAGNOSTIC AND OPERATIVE SCOPE   WISDOM TOOTH EXTRACTION  AGE 25   DENTAL OFFICE    Social History   Tobacco Use   Smoking status: Former    Types: Cigarettes    Quit date: 03/10/2008    Years since quitting: 14.4   Smokeless tobacco: Never   Tobacco comments:    SOCIAL SMOKER IN COLLEGE FOR 53YRS -- NO SMOKING SINCE  Vaping Use   Vaping Use: Never used  Substance Use Topics   Alcohol use: Not Currently    Alcohol/week: 3.0  - 5.0 standard drinks of alcohol    Types: 3 - 5 Glasses of wine per week    Comment: OCCASIONAL, "social"    Drug use: No    Family History  Problem Relation Age of Onset   Arthritis Mother    Anxiety disorder Mother    Depression Mother    Hypertension Mother    Mental illness Mother    Hashimoto's thyroiditis Mother    Hypertension Father    Hyperlipidemia Father    Anxiety disorder Maternal Grandmother    Colon cancer Maternal Grandfather     Allergies  Allergen Reactions   Contrast Media [Iodinated Contrast Media] Itching    Swelling, high heart rate   Amoxicillin Hives   Hydrocodeine [Dihydrocodeine] Rash   Penicillins Rash    Medication list has been reviewed and updated.  Current Outpatient Medications on File Prior to Visit  Medication Sig Dispense Refill   acetaminophen (TYLENOL) 500 MG tablet Take 2 tablets (1,000 mg total) by mouth every 6 (six) hours as needed. 100 tablet 2   acetaminophen (TYLENOL) 500 MG tablet Take 500 mg by mouth daily as needed for mild pain.     benzocaine-Menthol (DERMOPLAST) 20-0.5 % AERO Apply 1 application topically as needed for irritation (perineal discomfort).     busPIRone (BUSPAR) 5 MG tablet Take 5 mg by mouth 3 (three) times daily as needed (anxiety).     calcium carbonate (TUMS - DOSED IN MG ELEMENTAL CALCIUM) 500 MG chewable tablet Chew 1 tablet by mouth daily as needed for indigestion or heartburn.     cetirizine (ZYRTEC) 10 MG tablet Take 10 mg by mouth daily.     coconut oil OIL Apply 1 application topically as needed.  0   Docosahexaenoic Acid (DHA COMPLETE PO) Take 1 tablet by mouth daily.     famotidine (PEPCID) 20 MG tablet Take 20 mg by mouth daily as needed for heartburn or indigestion.     ibuprofen (ADVIL) 600 MG tablet Take 1 tablet (600 mg total) by mouth every 6 (six) hours. 30 tablet 0   iron polysaccharides (FERREX 150) 150 MG capsule Take 1 capsule (150 mg total) by mouth daily.     labetalol (NORMODYNE) 100  MG tablet Take 1 tablet (100 mg total) by mouth 2 (two) times daily. 30 tablet 0   magnesium oxide (MAG-OX) 400 (240 Mg) MG tablet Take 1 tablet (400 mg total) by mouth daily. For prevention of constipation. 30 tablet    NIFEdipine (ADALAT CC) 30 MG 24 hr tablet Take 1 tablet (30 mg total) by mouth daily. 30 tablet 0   Prenatal Vit-Fe Fumarate-FA (PRENATAL MULTIVITAMIN) TABS tablet Take 1 tablet by mouth daily at 12 noon.     Probiotic Product (  PROBIOTIC DAILY PO) Take 1 tablet by mouth daily.     pseudoephedrine (SUDAFED) 30 MG tablet Take 30 mg by mouth every 8 (eight) hours as needed for congestion.     sertraline (ZOLOFT) 100 MG tablet Take 2 tablets (200 mg total) by mouth daily. 180 tablet 3   valACYclovir (VALTREX) 1000 MG tablet Use as directed for suppression 90 tablet 1   No current facility-administered medications on file prior to visit.    Review of Systems:  As per HPI- otherwise negative.   Physical Examination: There were no vitals filed for this visit. There were no vitals filed for this visit. There is no height or weight on file to calculate BMI. Ideal Body Weight:    Patient observed her video monitor.  She looks well, her normal self  Assessment and Plan: Depression with anxiety - Plan: DULoxetine (CYMBALTA) 60 MG capsule, ALPRAZolam (XANAX) 0.25 MG tablet  Pain in joint of left shoulder - Plan: cyclobenzaprine (FLEXERIL) 10 MG tablet  Patient seen today for follow-up of depression, she is currently postpartum.  She has been taking sertraline 200 mg but notes it is not working as she would like.  She would like to go back on Cymbalta which she is previously.  We discussed a tapering routine for sertraline which I we will send the patient in a MyChart message She also would like alprazolam to use as needed for panic attack, this is refilled She also does use some Flexeril as needed for back pain which I refilled  Signed Abbe Amsterdam, MD  Plan to decrease to  sertraline 150 mg for 3 days, then 100 for 3 days , then 50 mg for 5 days, then change over to cymbalta 60 mg.  We can go up to 120 mg if we need do

## 2022-08-13 ENCOUNTER — Telehealth (INDEPENDENT_AMBULATORY_CARE_PROVIDER_SITE_OTHER): Payer: BC Managed Care – PPO | Admitting: Family Medicine

## 2022-08-13 ENCOUNTER — Encounter: Payer: Self-pay | Admitting: Family Medicine

## 2022-08-13 DIAGNOSIS — F418 Other specified anxiety disorders: Secondary | ICD-10-CM

## 2022-08-13 DIAGNOSIS — M25512 Pain in left shoulder: Secondary | ICD-10-CM

## 2022-08-13 MED ORDER — CYCLOBENZAPRINE HCL 10 MG PO TABS: 10.0000 mg | ORAL_TABLET | Freq: Three times a day (TID) | ORAL | 0 refills | Status: DC | PRN

## 2022-08-13 MED ORDER — DULOXETINE HCL 60 MG PO CPEP: 60.0000 mg | ORAL_CAPSULE | Freq: Every day | ORAL | 4 refills | Status: DC

## 2022-08-13 MED ORDER — ALPRAZOLAM 0.25 MG PO TABS: 0.2500 mg | ORAL_TABLET | Freq: Two times a day (BID) | ORAL | 0 refills | Status: DC | PRN

## 2022-09-03 ENCOUNTER — Encounter (HOSPITAL_BASED_OUTPATIENT_CLINIC_OR_DEPARTMENT_OTHER): Admission: RE | Payer: Self-pay | Source: Home / Self Care

## 2022-09-03 ENCOUNTER — Ambulatory Visit (HOSPITAL_BASED_OUTPATIENT_CLINIC_OR_DEPARTMENT_OTHER)
Admission: RE | Admit: 2022-09-03 | Payer: BC Managed Care – PPO | Source: Home / Self Care | Admitting: Orthopedic Surgery

## 2022-09-03 SURGERY — CARPAL TUNNEL RELEASE
Anesthesia: Regional | Laterality: Right

## 2022-10-03 ENCOUNTER — Encounter: Payer: Self-pay | Admitting: Family Medicine

## 2022-10-03 DIAGNOSIS — F418 Other specified anxiety disorders: Secondary | ICD-10-CM

## 2022-10-08 MED ORDER — DULOXETINE HCL 60 MG PO CPEP: 120.0000 mg | ORAL_CAPSULE | Freq: Every day | ORAL | 5 refills | Status: DC

## 2023-01-03 ENCOUNTER — Other Ambulatory Visit: Payer: Self-pay | Admitting: Orthopedic Surgery

## 2023-01-21 ENCOUNTER — Encounter: Payer: Self-pay | Admitting: Family Medicine

## 2023-02-02 NOTE — Progress Notes (Deleted)
[place] at [facility] [address] [place], [place] [address] [phone]  Date:  02/11/2023   Name:  Patricia Morse   DOB:  08/28/1986   MRN:  [id number]  PCP:  Darreld Mclean, MD    Chief Complaint: No chief complaint on file.   History of Present Illness:  Patricia Morse is a 37 y.o. very pleasant female patient who presents with the following:  Patient seen today for physical exam History of hearing loss with cochlear implant, gestational hypertension, hyperlipidemia, anxiety depression Most recent visit with myself was a virtual visit in September- at time she was struggling with some postpartum depression and we changed her from sertraline to Cymbalta  She delivered her son Marlou Sa in March 2023-she and her husband are thinking of trying for another pregnancy in the next 6 months or so  She is currently taking atorvastatin 40 mg for hyperlipidemia, will need to stop this prior to trying to conceive Lab Results  Component Value Date   HGBA1C 5.1 07/07/2020   Can update routine blood work if she would like Patient Active Problem List   Diagnosis Date Noted   Maternal anemia, with delivery 01/15/2022   Encounter for induction of labor 01/13/2022   Gestational hypertension 01/13/2022   SVD 2/18 01/13/2022   Postpartum care following vaginal delivery 2/18 01/13/2022   Second degree perineal laceration 01/13/2022   Rubella non-immune 01/13/2022   Depression with anxiety 03/17/2020   Hearing loss 10/10/2012    Past Medical History:  Diagnosis Date   Allergy    Auditory neuropathy BILATERAL   Bilateral sensorineural hearing loss    SECONDARY AUDITORY NEUROPATHY   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     Depression    History of encephalitis    unsure if it was encephalitis or guillian barre  no flu shots   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   RESIDUAL ABSENCE OF KNEE REFLEX   Internal  derangement of right knee    PONV (postoperative nausea and vomiting) POSTOP COCHLEAR  IMPLANT 2006   Pregnancy induced hypertension    Seasonal allergies     Past Surgical History:  Procedure Laterality Date   BILATERAL MIDDLE EAR EXPLORATION/ SEALING OF OVAL AND ROUND WINDOW FISTULAS  1999 (AGE 59)   PERILYMPH FISTULAS   CHONDROPLASTY  03/13/2012   Procedure: CHONDROPLASTY;  Surgeon: Mauri Pole, MD;  Location: [place];  Service: Orthopedics;  Laterality: Right;   COCHLEAR IMPLANT  11-15-2005  (AGE 50)   LEFT EAR  (CLARION HIGH-RESOLUTION 90K MULTICHANNEL,  1-J ELECTRODE)   COCHLEAR IMPLANT Right 11/12/2018   Procedure: COCHLEAR IMPLANT RIGHT EAR;  Surgeon: Vicie Mutters, MD;  Location: [place];  Service: ENT;  Laterality: Right;   KNEE ARTHROSCOPY  03/13/2012   Procedure: ARTHROSCOPY KNEE;  Surgeon: Mauri Pole, MD;  Location: [facility];  Service: Orthopedics;  Laterality: Right;  RIGHT KNEE DIAGNOSTIC AND OPERATIVE SCOPE   WISDOM TOOTH EXTRACTION  AGE 59   DENTAL OFFICE    Social History   Tobacco Use   Smoking status: Former    Types: Cigarettes    Quit date: 03/10/2008    Years since quitting: 14.9   Smokeless tobacco: Never   Tobacco comments:    SOCIAL SMOKER IN COLLEGE FOR 16YRS -- NO SMOKING SINCE  Vaping Use   Vaping Use: Never used  Substance  Use Topics   Alcohol use: Not Currently    Alcohol/week: 3.0 - 5.0 standard drinks of alcohol    Types: 3 - 5 Glasses of wine per week    Comment: OCCASIONAL, "social"    Drug use: No    Family History  Problem Relation Age of Onset   Arthritis Mother    Anxiety disorder Mother    Depression Mother    Hypertension Mother    Mental illness Mother    Hashimoto's thyroiditis Mother    Hypertension Father    Hyperlipidemia Father    Anxiety disorder Maternal Grandmother    Colon cancer Maternal Grandfather     Allergies  Allergen Reactions   Contrast Media  [Iodinated Contrast Media] Itching    Swelling, high heart rate   Amoxicillin Hives   Hydrocodeine [Dihydrocodeine] Rash   Penicillins Rash    Medication list has been reviewed and updated.  Current Outpatient Medications on File Prior to Visit  Medication Sig Dispense Refill   acetaminophen (TYLENOL) 500 MG tablet Take 500 mg by mouth daily as needed for mild pain.     ALPRAZolam (XANAX) 0.25 MG tablet Take 1 tablet (0.25 mg total) by mouth 2 (two) times daily as needed for anxiety. 20 tablet 0   benzocaine-Menthol (DERMOPLAST) 20-0.5 % AERO Apply 1 application topically as needed for irritation (perineal discomfort).     busPIRone (BUSPAR) 5 MG tablet Take 5 mg by mouth 3 (three) times daily as needed (anxiety).     calcium carbonate (TUMS - DOSED IN MG ELEMENTAL CALCIUM) 500 MG chewable tablet Chew 1 tablet by mouth daily as needed for indigestion or heartburn.     cetirizine (ZYRTEC) 10 MG tablet Take 10 mg by mouth daily.     coconut oil OIL Apply 1 application topically as needed.  0   cyclobenzaprine (FLEXERIL) 10 MG tablet Take 1 tablet (10 mg total) by mouth 3 (three) times daily as needed for muscle spasms. 30 tablet 0   Docosahexaenoic Acid (DHA COMPLETE PO) Take 1 tablet by mouth daily.     DULoxetine (CYMBALTA) 60 MG capsule Take 2 capsules (120 mg total) by mouth daily. 60 capsule 5   famotidine (PEPCID) 20 MG tablet Take 20 mg by mouth daily as needed for heartburn or indigestion.     ibuprofen (ADVIL) 600 MG tablet Take 1 tablet (600 mg total) by mouth every 6 (six) hours. 30 tablet 0   iron polysaccharides (FERREX 150) 150 MG capsule Take 1 capsule (150 mg total) by mouth daily.     labetalol (NORMODYNE) 100 MG tablet Take 1 tablet (100 mg total) by mouth 2 (two) times daily. 30 tablet 0   magnesium oxide (MAG-OX) 400 (240 Mg) MG tablet Take 1 tablet (400 mg total) by mouth daily. For prevention of constipation. 30 tablet    NIFEdipine (ADALAT CC) 30 MG 24 hr tablet Take 1  tablet (30 mg total) by mouth daily. 30 tablet 0   Prenatal Vit-Fe Fumarate-FA (PRENATAL MULTIVITAMIN) TABS tablet Take 1 tablet by mouth daily at 12 noon.     Probiotic Product (PROBIOTIC DAILY PO) Take 1 tablet by mouth daily.     pseudoephedrine (SUDAFED) 30 MG tablet Take 30 mg by mouth every 8 (eight) hours as needed for congestion.     valACYclovir (VALTREX) 1000 MG tablet Use as directed for suppression 90 tablet 1   No current facility-administered medications on file prior to visit.    Review of Systems:  As  per HPI- otherwise negative.   Physical Examination: There were no vitals filed for this visit. There were no vitals filed for this visit. There is no height or weight on file to calculate BMI. Ideal Body Weight:    GEN: no acute distress. HEENT: Atraumatic, Normocephalic.  Ears and Nose: No external deformity. CV: RRR, No M/G/R. No JVD. No thrill. No extra heart sounds. PULM: CTA B, no wheezes, crackles, rhonchi. No retractions. No resp. distress. No accessory muscle use. ABD: S, NT, ND, +BS. No rebound. No HSM. EXTR: No c/c/e PSYCH: Normally interactive. Conversant.    Assessment and Plan: *** Physical exam today.  Encouraged healthy diet and exercise routine Signed Lamar Blinks, MD

## 2023-02-11 ENCOUNTER — Encounter: Payer: BC Managed Care – PPO | Admitting: Family Medicine

## 2023-02-11 DIAGNOSIS — Z0000 Encounter for general adult medical examination without abnormal findings: Secondary | ICD-10-CM

## 2023-02-11 DIAGNOSIS — R5383 Other fatigue: Secondary | ICD-10-CM

## 2023-02-11 DIAGNOSIS — Z1329 Encounter for screening for other suspected endocrine disorder: Secondary | ICD-10-CM

## 2023-02-11 DIAGNOSIS — Z862 Personal history of diseases of the blood and blood-forming organs and certain disorders involving the immune mechanism: Secondary | ICD-10-CM

## 2023-02-11 DIAGNOSIS — Z131 Encounter for screening for diabetes mellitus: Secondary | ICD-10-CM

## 2023-02-11 DIAGNOSIS — E785 Hyperlipidemia, unspecified: Secondary | ICD-10-CM

## 2023-02-20 ENCOUNTER — Other Ambulatory Visit: Payer: Self-pay | Admitting: Orthopedic Surgery

## 2023-03-11 ENCOUNTER — Telehealth: Payer: Self-pay | Admitting: Family Medicine

## 2023-03-11 DIAGNOSIS — Z130 Encounter for screening for diseases of the blood and blood-forming organs and certain disorders involving the immune mechanism: Secondary | ICD-10-CM

## 2023-03-11 DIAGNOSIS — Z1329 Encounter for screening for other suspected endocrine disorder: Secondary | ICD-10-CM

## 2023-03-11 DIAGNOSIS — Z0000 Encounter for general adult medical examination without abnormal findings: Secondary | ICD-10-CM

## 2023-03-11 DIAGNOSIS — Z131 Encounter for screening for diabetes mellitus: Secondary | ICD-10-CM

## 2023-03-11 DIAGNOSIS — Z13220 Encounter for screening for lipoid disorders: Secondary | ICD-10-CM

## 2023-03-11 DIAGNOSIS — R5383 Other fatigue: Secondary | ICD-10-CM

## 2023-03-11 NOTE — Addendum Note (Signed)
Addended by: Abbe Amsterdam C on: 03/11/2023 08:01 PM   Modules accepted: Orders

## 2023-03-11 NOTE — Telephone Encounter (Signed)
Okay for labs prior? °

## 2023-03-11 NOTE — Telephone Encounter (Signed)
Pt is scheduled for her physical on 4/22 and wants to come in another morning to have her labs drawn. Please place orders and call pt to get lab scheduled

## 2023-03-17 NOTE — Patient Instructions (Incomplete)
It was great to see again today, I will be in touch with your labs ASAP Let's drop cymbalta back to 90 for a couple of weeks, then drop to 60 and let me know how you do We can add some buspar when you get down to 60 of cymbalta if you like

## 2023-03-17 NOTE — Progress Notes (Unsigned)
[facility] [address], [address] [place], [place] [address] [phone]  Date:  03/18/2023   Name:  TAKILA KRONBERG   DOB:  Jun 24, 1986   MRN:  [id number]  PCP:  Pearline Cables, MD    Chief Complaint: No chief complaint on file.   History of Present Illness:  MAGUIRE KILLMER is a 37 y.o. very pleasant female patient who presents with the following:  Patient seen today for physical exam Generally healthy young woman with history of hearing loss and cochlear implant  Most recent visit with myself was a virtual visit in September-at that time she was struggling with postpartum depression, we changed her from sertraline to Cymbalta She delivered her son Alan Mulder in March 2023 Married to [employer]   Pap smear Can update blood work  Patient Active Problem List   Diagnosis Date Noted   Maternal anemia, with delivery 01/15/2022   Encounter for induction of labor 01/13/2022   Gestational hypertension 01/13/2022   SVD 2/18 01/13/2022   Postpartum care following vaginal delivery 2/18 01/13/2022   Second degree perineal laceration 01/13/2022   Rubella non-immune 01/13/2022   Depression with anxiety 03/17/2020   Hearing loss 10/10/2012    Past Medical History:  Diagnosis Date   Allergy    Auditory neuropathy BILATERAL   Bilateral sensorineural hearing loss    SECONDARY AUDITORY NEUROPATHY   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     Depression    History of encephalitis    unsure if it was encephalitis or guillian barre  no flu shots   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   RESIDUAL ABSENCE OF KNEE REFLEX   Internal derangement of right knee    PONV (postoperative nausea and vomiting) POSTOP COCHLEAR  IMPLANT 2006   Pregnancy induced hypertension    Seasonal allergies     Past Surgical History:  Procedure Laterality Date   BILATERAL MIDDLE EAR EXPLORATION/ SEALING OF OVAL AND ROUND WINDOW FISTULAS   1999 (AGE 26)   PERILYMPH FISTULAS   CHONDROPLASTY  03/13/2012   Procedure: CHONDROPLASTY;  Surgeon: Shelda Pal, MD;  Location: [facility];  Service: Orthopedics;  Laterality: Right;   COCHLEAR IMPLANT  11-15-2005  (AGE 63)   LEFT EAR  (CLARION HIGH-RESOLUTION 90K MULTICHANNEL,  1-J ELECTRODE)   COCHLEAR IMPLANT Right 11/12/2018   Procedure: COCHLEAR IMPLANT RIGHT EAR;  Surgeon: Ermalinda Barrios, MD;  Location: [facility];  Service: ENT;  Laterality: Right;   KNEE ARTHROSCOPY  03/13/2012   Procedure: ARTHROSCOPY KNEE;  Surgeon: Shelda Pal, MD;  Location: [facility];  Service: Orthopedics;  Laterality: Right;  RIGHT KNEE DIAGNOSTIC AND OPERATIVE SCOPE   WISDOM TOOTH EXTRACTION  AGE 53   DENTAL OFFICE    Social History   Tobacco Use   Smoking status: Former    Types: Cigarettes    Quit date: 03/10/2008    Years since quitting: 15.0   Smokeless tobacco: Never   Tobacco comments:    SOCIAL SMOKER IN COLLEGE FOR 1YRS -- NO SMOKING SINCE  Vaping Use   Vaping Use: Never used  Substance Use Topics   Alcohol use: Not Currently    Alcohol/week: 3.0 - 5.0 standard drinks of alcohol    Types: 3 - 5 Glasses of wine per week    Comment: OCCASIONAL, "social"    Drug use: No    Family History  Problem Relation Age of Onset   Arthritis Mother    Anxiety disorder Mother    Depression Mother    Hypertension Mother    Mental illness Mother    Hashimoto's thyroiditis Mother    Hypertension Father    Hyperlipidemia Father    Anxiety disorder Maternal Grandmother    Colon cancer Maternal Grandfather     Allergies  Allergen Reactions   Contrast Media [Iodinated Contrast Media] Itching    Swelling, high heart rate   Amoxicillin Hives   Hydrocodeine [Dihydrocodeine] Rash   Penicillins Rash    Medication list has been reviewed and updated.  Current Outpatient Medications on File Prior to Visit  Medication Sig Dispense Refill    acetaminophen (TYLENOL) 500 MG tablet Take 500 mg by mouth daily as needed for mild pain.     ALPRAZolam (XANAX) 0.25 MG tablet Take 1 tablet (0.25 mg total) by mouth 2 (two) times daily as needed for anxiety. 20 tablet 0   benzocaine-Menthol (DERMOPLAST) 20-0.5 % AERO Apply 1 application topically as needed for irritation (perineal discomfort).     busPIRone (BUSPAR) 5 MG tablet Take 5 mg by mouth 3 (three) times daily as needed (anxiety).     calcium carbonate (TUMS - DOSED IN MG ELEMENTAL CALCIUM) 500 MG chewable tablet Chew 1 tablet by mouth daily as needed for indigestion or heartburn.     cetirizine (ZYRTEC) 10 MG tablet Take 10 mg by mouth daily.     coconut oil OIL Apply 1 application topically as needed.  0   cyclobenzaprine (FLEXERIL) 10 MG tablet Take 1 tablet (10 mg total) by mouth 3 (three) times daily as needed for muscle spasms. 30 tablet 0   Docosahexaenoic Acid (DHA COMPLETE PO) Take 1 tablet by mouth daily.     DULoxetine (CYMBALTA) 60 MG capsule Take 2 capsules (120 mg total) by mouth daily. 60 capsule 5   famotidine (PEPCID) 20 MG tablet Take 20 mg by mouth daily as needed for heartburn or indigestion.     ibuprofen (ADVIL) 600 MG tablet Take 1 tablet (600 mg total) by mouth every 6 (six) hours. 30 tablet 0   iron polysaccharides (FERREX 150) 150 MG capsule Take 1 capsule (150 mg total) by mouth daily.     labetalol (NORMODYNE) 100 MG tablet Take 1 tablet (100 mg total) by mouth 2 (two) times daily. 30 tablet 0   magnesium oxide (MAG-OX) 400 (240 Mg) MG tablet Take 1 tablet (400 mg total) by mouth daily. For prevention of constipation. 30 tablet    NIFEdipine (ADALAT CC) 30 MG 24 hr tablet Take 1 tablet (30 mg total) by mouth daily. 30 tablet 0   Prenatal Vit-Fe Fumarate-FA (PRENATAL MULTIVITAMIN) TABS tablet Take 1 tablet by mouth daily at 12 noon.     Probiotic Product (PROBIOTIC DAILY PO) Take 1 tablet by mouth daily.     pseudoephedrine (SUDAFED) 30 MG tablet Take 30 mg by  mouth every 8 (eight) hours as needed for congestion.     valACYclovir (VALTREX) 1000 MG tablet Use as directed for suppression 90 tablet 1   No current facility-administered medications on file prior to visit.    Review of Systems:  As per HPI- otherwise negative.   Physical Examination: There were no vitals filed for this visit. There were no vitals filed for this visit. There is no height or weight on file to calculate BMI. Ideal Body Weight:    GEN: no acute distress. HEENT: Atraumatic, Normocephalic.  Ears and Nose: No external deformity. CV: RRR, No M/G/R. No JVD. No thrill. No extra heart sounds. PULM: CTA B, no wheezes, crackles, rhonchi. No retractions. No resp. distress. No accessory muscle use. ABD: S, NT, ND, +BS. No rebound. No HSM. EXTR: No c/c/e PSYCH: Normally interactive. Conversant.    Assessment and Plan: *** Physical exam today.  Encouraged healthy diet and exercise routine Signed Abbe Amsterdam, MD

## 2023-03-18 ENCOUNTER — Ambulatory Visit (INDEPENDENT_AMBULATORY_CARE_PROVIDER_SITE_OTHER): Payer: BC Managed Care – PPO | Admitting: Family Medicine

## 2023-03-18 VITALS — BP 122/80 | HR 63 | Temp 97.8°F | Resp 18 | Ht 65.0 in | Wt 223.8 lb

## 2023-03-18 DIAGNOSIS — R5383 Other fatigue: Secondary | ICD-10-CM | POA: Diagnosis not present

## 2023-03-18 DIAGNOSIS — N912 Amenorrhea, unspecified: Secondary | ICD-10-CM

## 2023-03-18 DIAGNOSIS — Z1329 Encounter for screening for other suspected endocrine disorder: Secondary | ICD-10-CM | POA: Diagnosis not present

## 2023-03-18 DIAGNOSIS — Z131 Encounter for screening for diabetes mellitus: Secondary | ICD-10-CM | POA: Diagnosis not present

## 2023-03-18 DIAGNOSIS — Z0000 Encounter for general adult medical examination without abnormal findings: Secondary | ICD-10-CM | POA: Diagnosis not present

## 2023-03-18 DIAGNOSIS — Z130 Encounter for screening for diseases of the blood and blood-forming organs and certain disorders involving the immune mechanism: Secondary | ICD-10-CM | POA: Diagnosis not present

## 2023-03-18 DIAGNOSIS — F418 Other specified anxiety disorders: Secondary | ICD-10-CM

## 2023-03-18 DIAGNOSIS — H9190 Unspecified hearing loss, unspecified ear: Secondary | ICD-10-CM

## 2023-03-18 DIAGNOSIS — Z13220 Encounter for screening for lipoid disorders: Secondary | ICD-10-CM

## 2023-03-18 LAB — POCT URINE PREGNANCY: Preg Test, Ur: NEGATIVE

## 2023-03-18 MED ORDER — DULOXETINE HCL 30 MG PO CPEP
30.0000 mg | ORAL_CAPSULE | Freq: Every day | ORAL | 0 refills | Status: AC
Start: 2023-03-18 — End: ?

## 2023-03-19 ENCOUNTER — Encounter: Payer: Self-pay | Admitting: Family Medicine

## 2023-03-19 LAB — COMPREHENSIVE METABOLIC PANEL
ALT: 36 U/L — ABNORMAL HIGH (ref 0–35)
AST: 28 U/L (ref 0–37)
Albumin: 4.3 g/dL (ref 3.5–5.2)
Alkaline Phosphatase: 79 U/L (ref 39–117)
BUN: 9 mg/dL (ref 6–23)
CO2: 25 mEq/L (ref 19–32)
Calcium: 9.1 mg/dL (ref 8.4–10.5)
Chloride: 102 mEq/L (ref 96–112)
Creatinine, Ser: 0.71 mg/dL (ref 0.40–1.20)
GFR: 110.26 mL/min (ref >60.00)
Glucose, Bld: 82 mg/dL (ref 70–99)
Potassium: 3.9 mEq/L (ref 3.5–5.1)
Sodium: 137 mEq/L (ref 135–145)
Total Bilirubin: 0.6 mg/dL (ref 0.2–1.2)
Total Protein: 7.1 g/dL (ref 6.0–8.3)

## 2023-03-19 LAB — CBC
HCT: 41.3 % (ref 36.0–46.0)
Hemoglobin: 14.3 g/dL (ref 12.0–15.0)
MCHC: 34.6 g/dL (ref 30.0–36.0)
MCV: 86.2 fl (ref 78.0–100.0)
Platelets: 266 10*3/uL (ref 150.0–400.0)
RBC: 4.8 Mil/uL (ref 3.87–5.11)
RDW: 13.3 % (ref 11.5–15.5)
WBC: 9 10*3/uL (ref 4.0–10.5)

## 2023-03-19 LAB — LIPID PANEL
Cholesterol: 191 mg/dL (ref 0–200)
HDL: 44.6 mg/dL (ref >39.00)
LDL Cholesterol: 119 mg/dL — ABNORMAL HIGH (ref 0–99)
NonHDL: 146.05
Total CHOL/HDL Ratio: 4
Triglycerides: 137 mg/dL (ref 0.0–149.0)
VLDL: 27.4 mg/dL (ref 0.0–40.0)

## 2023-03-19 LAB — TSH: TSH: 1.94 u[IU]/mL (ref 0.35–5.50)

## 2023-03-19 LAB — HEMOGLOBIN A1C: Hgb A1c MFr Bld: 5.5 % (ref 4.6–6.5)

## 2023-03-19 LAB — CORTISOL: Cortisol, Plasma: 1.3 ug/dL

## 2023-03-19 LAB — VITAMIN D 25 HYDROXY (VIT D DEFICIENCY, FRACTURES): VITD: 34.62 ng/mL (ref 30.00–100.00)

## 2023-03-26 ENCOUNTER — Other Ambulatory Visit: Payer: Self-pay | Admitting: Family Medicine

## 2023-03-26 DIAGNOSIS — F418 Other specified anxiety disorders: Secondary | ICD-10-CM

## 2023-04-02 ENCOUNTER — Encounter (HOSPITAL_BASED_OUTPATIENT_CLINIC_OR_DEPARTMENT_OTHER): Payer: Self-pay | Admitting: Orthopedic Surgery

## 2023-04-02 ENCOUNTER — Other Ambulatory Visit: Payer: Self-pay

## 2023-04-09 ENCOUNTER — Other Ambulatory Visit: Payer: Self-pay

## 2023-04-09 ENCOUNTER — Encounter (HOSPITAL_BASED_OUTPATIENT_CLINIC_OR_DEPARTMENT_OTHER)
Admission: RE | Disposition: A | Discharge status: Home / Self Care | Payer: Self-pay | Source: Home / Self Care | Attending: Orthopedic Surgery

## 2023-04-09 ENCOUNTER — Ambulatory Visit (HOSPITAL_BASED_OUTPATIENT_CLINIC_OR_DEPARTMENT_OTHER): Payer: BC Managed Care – PPO | Admitting: Certified Registered"

## 2023-04-09 ENCOUNTER — Encounter (HOSPITAL_BASED_OUTPATIENT_CLINIC_OR_DEPARTMENT_OTHER): Payer: Self-pay | Admitting: Orthopedic Surgery

## 2023-04-09 ENCOUNTER — Ambulatory Visit (HOSPITAL_BASED_OUTPATIENT_CLINIC_OR_DEPARTMENT_OTHER)
Admission: RE | Admit: 2023-04-09 | Discharge: 2023-04-09 | Disposition: A | Discharge status: Home / Self Care | Payer: BC Managed Care – PPO | Attending: Orthopedic Surgery | Admitting: Orthopedic Surgery

## 2023-04-09 DIAGNOSIS — G5601 Carpal tunnel syndrome, right upper limb: Secondary | ICD-10-CM | POA: Diagnosis not present

## 2023-04-09 DIAGNOSIS — Z01818 Encounter for other preprocedural examination: Secondary | ICD-10-CM

## 2023-04-09 DIAGNOSIS — Z87891 Personal history of nicotine dependence: Secondary | ICD-10-CM | POA: Insufficient documentation

## 2023-04-09 HISTORY — PX: CARPAL TUNNEL RELEASE: SHX101

## 2023-04-09 LAB — POCT PREGNANCY, URINE: Preg Test, Ur: NEGATIVE

## 2023-04-09 SURGERY — CARPAL TUNNEL RELEASE
Anesthesia: Monitor Anesthesia Care | Site: Hand | Laterality: Right

## 2023-04-09 MED ORDER — BUPIVACAINE HCL (PF) 0.25 % IJ SOLN
INTRAMUSCULAR | Status: DC | PRN
  Administered 2023-04-09: 9 mL

## 2023-04-09 MED ORDER — FENTANYL CITRATE (PF) 100 MCG/2ML IJ SOLN
INTRAMUSCULAR | Status: AC
  Filled 2023-04-09: qty 2

## 2023-04-09 MED ORDER — PROPOFOL 500 MG/50ML IV EMUL
INTRAVENOUS | Status: DC | PRN
  Administered 2023-04-09: 25 ug/kg/min via INTRAVENOUS

## 2023-04-09 MED ORDER — 0.9 % SODIUM CHLORIDE (POUR BTL) OPTIME
TOPICAL | Status: DC | PRN
  Administered 2023-04-09: 75 mL

## 2023-04-09 MED ORDER — ATROPINE SULFATE 0.4 MG/ML IV SOLN
INTRAVENOUS | Status: AC
  Filled 2023-04-09: qty 1

## 2023-04-09 MED ORDER — VANCOMYCIN HCL IN DEXTROSE 1-5 GM/200ML-% IV SOLN
1000.0000 mg | INTRAVENOUS | Status: AC
  Administered 2023-04-09: 1000 mg via INTRAVENOUS

## 2023-04-09 MED ORDER — EPHEDRINE 5 MG/ML INJ
INTRAVENOUS | Status: AC
  Filled 2023-04-09: qty 5

## 2023-04-09 MED ORDER — BUPIVACAINE HCL (PF) 0.25 % IJ SOLN
INTRAMUSCULAR | Status: AC
  Filled 2023-04-09: qty 30

## 2023-04-09 MED ORDER — VANCOMYCIN HCL IN DEXTROSE 1-5 GM/200ML-% IV SOLN
INTRAVENOUS | Status: AC
  Filled 2023-04-09: qty 200

## 2023-04-09 MED ORDER — FENTANYL CITRATE (PF) 100 MCG/2ML IJ SOLN
INTRAMUSCULAR | Status: DC | PRN
  Administered 2023-04-09: 50 ug via INTRAVENOUS

## 2023-04-09 MED ORDER — LIDOCAINE 2% (20 MG/ML) 5 ML SYRINGE
INTRAMUSCULAR | Status: AC
  Filled 2023-04-09: qty 5

## 2023-04-09 MED ORDER — PHENYLEPHRINE 80 MCG/ML (10ML) SYRINGE FOR IV PUSH (FOR BLOOD PRESSURE SUPPORT)
PREFILLED_SYRINGE | INTRAVENOUS | Status: AC
  Filled 2023-04-09: qty 10

## 2023-04-09 MED ORDER — AMISULPRIDE (ANTIEMETIC) 5 MG/2ML IV SOLN: 10.0000 mg | Freq: Once | INTRAVENOUS | Status: DC | PRN

## 2023-04-09 MED ORDER — FENTANYL CITRATE (PF) 100 MCG/2ML IJ SOLN: 25.0000 ug | INTRAMUSCULAR | Status: DC | PRN

## 2023-04-09 MED ORDER — LACTATED RINGERS IV SOLN: INTRAVENOUS | Status: DC

## 2023-04-09 MED ORDER — ONDANSETRON HCL 4 MG/2ML IJ SOLN
INTRAMUSCULAR | Status: AC
  Filled 2023-04-09: qty 2

## 2023-04-09 MED ORDER — OXYCODONE HCL 5 MG PO TABS: 5.0000 mg | ORAL_TABLET | Freq: Once | ORAL | Status: DC | PRN

## 2023-04-09 MED ORDER — ONDANSETRON HCL 4 MG/2ML IJ SOLN
INTRAMUSCULAR | Status: DC | PRN
  Administered 2023-04-09: 4 mg via INTRAVENOUS

## 2023-04-09 MED ORDER — SUCCINYLCHOLINE CHLORIDE 200 MG/10ML IV SOSY
PREFILLED_SYRINGE | INTRAVENOUS | Status: AC
  Filled 2023-04-09: qty 10

## 2023-04-09 MED ORDER — OXYCODONE HCL 5 MG/5ML PO SOLN: 5.0000 mg | Freq: Once | ORAL | Status: DC | PRN

## 2023-04-09 MED ORDER — TRAMADOL HCL 50 MG PO TABS: ORAL_TABLET | ORAL | 0 refills | Status: DC

## 2023-04-09 MED ORDER — MIDAZOLAM HCL 2 MG/2ML IJ SOLN
INTRAMUSCULAR | Status: AC
  Filled 2023-04-09: qty 2

## 2023-04-09 MED ORDER — MIDAZOLAM HCL 5 MG/5ML IJ SOLN
INTRAMUSCULAR | Status: DC | PRN
  Administered 2023-04-09: 1 mg via INTRAVENOUS

## 2023-04-09 MED ORDER — LIDOCAINE HCL (CARDIAC) PF 100 MG/5ML IV SOSY
PREFILLED_SYRINGE | INTRAVENOUS | Status: DC | PRN
  Administered 2023-04-09: 60 mg via INTRAVENOUS

## 2023-04-09 MED ORDER — LIDOCAINE HCL (PF) 0.5 % IJ SOLN
INTRAMUSCULAR | Status: DC | PRN
  Administered 2023-04-09: 30 mL via INTRAVENOUS

## 2023-04-09 SURGICAL SUPPLY — 39 items
APL PRP STRL LF DISP 70% ISPRP (MISCELLANEOUS) ×1
BLADE SURG 15 STRL LF DISP TIS (BLADE) ×4 IMPLANT
BLADE SURG 15 STRL SS (BLADE) ×2
BNDG CMPR 5X3 KNIT ELC UNQ LF (GAUZE/BANDAGES/DRESSINGS) ×1
BNDG CMPR 9X4 STRL LF SNTH (GAUZE/BANDAGES/DRESSINGS) ×1
BNDG ELASTIC 3INX 5YD STR LF (GAUZE/BANDAGES/DRESSINGS) ×2 IMPLANT
BNDG ESMARK 4X9 LF (GAUZE/BANDAGES/DRESSINGS) IMPLANT
BNDG GAUZE DERMACEA FLUFF 4 (GAUZE/BANDAGES/DRESSINGS) ×2 IMPLANT
BNDG GZE DERMACEA 4 6PLY (GAUZE/BANDAGES/DRESSINGS) ×1
CHLORAPREP W/TINT 26 (MISCELLANEOUS) ×2 IMPLANT
CORD BIPOLAR FORCEPS 12FT (ELECTRODE) ×2 IMPLANT
COVER BACK TABLE 60X90IN (DRAPES) ×2 IMPLANT
COVER MAYO STAND STRL (DRAPES) ×2 IMPLANT
CUFF TOURN SGL QUICK 18X4 (TOURNIQUET CUFF) ×2 IMPLANT
DRAPE EXTREMITY T 121X128X90 (DISPOSABLE) ×2 IMPLANT
DRAPE SURG 17X23 STRL (DRAPES) ×2 IMPLANT
GAUZE PAD ABD 8X10 STRL (GAUZE/BANDAGES/DRESSINGS) ×2 IMPLANT
GAUZE SPONGE 4X4 12PLY STRL (GAUZE/BANDAGES/DRESSINGS) ×2 IMPLANT
GAUZE XEROFORM 1X8 LF (GAUZE/BANDAGES/DRESSINGS) ×2 IMPLANT
GLOVE BIO SURGEON STRL SZ7.5 (GLOVE) ×2 IMPLANT
GLOVE BIO SURGEON STRL SZ8 (GLOVE) IMPLANT
GLOVE BIOGEL PI IND STRL 7.0 (GLOVE) IMPLANT
GLOVE BIOGEL PI IND STRL 8 (GLOVE) ×2 IMPLANT
GLOVE BIOGEL PI IND STRL 8.5 (GLOVE) IMPLANT
GOWN STRL REUS W/ TWL LRG LVL3 (GOWN DISPOSABLE) ×2 IMPLANT
GOWN STRL REUS W/ TWL XL LVL3 (GOWN DISPOSABLE) IMPLANT
GOWN STRL REUS W/TWL LRG LVL3 (GOWN DISPOSABLE)
GOWN STRL REUS W/TWL XL LVL3 (GOWN DISPOSABLE) ×3 IMPLANT
NDL HYPO 25X1 1.5 SAFETY (NEEDLE) ×2 IMPLANT
NEEDLE HYPO 25X1 1.5 SAFETY (NEEDLE) ×1 IMPLANT
NS IRRIG 1000ML POUR BTL (IV SOLUTION) ×2 IMPLANT
PACK BASIN DAY SURGERY FS (CUSTOM PROCEDURE TRAY) ×2 IMPLANT
PADDING CAST ABS COTTON 4X4 ST (CAST SUPPLIES) ×2 IMPLANT
STOCKINETTE 4X48 STRL (DRAPES) ×2 IMPLANT
SUT ETHILON 4 0 PS 2 18 (SUTURE) ×2 IMPLANT
SYR BULB EAR ULCER 3OZ GRN STR (SYRINGE) ×2 IMPLANT
SYR CONTROL 10ML LL (SYRINGE) ×2 IMPLANT
TOWEL GREEN STERILE FF (TOWEL DISPOSABLE) ×4 IMPLANT
UNDERPAD 30X36 HEAVY ABSORB (UNDERPADS AND DIAPERS) ×2 IMPLANT

## 2023-04-09 NOTE — Anesthesia Postprocedure Evaluation (Signed)
Anesthesia Post Note  Patient: Patricia Morse  Procedure(s) Performed: RIGHT CARPAL TUNNEL RELEASE (Right: Hand)     Patient location during evaluation: PACU Anesthesia Type: MAC Level of consciousness: awake Pain management: pain level controlled Vital Signs Assessment: post-procedure vital signs reviewed and stable Respiratory status: spontaneous breathing, nonlabored ventilation and respiratory function stable Cardiovascular status: stable and blood pressure returned to baseline Postop Assessment: no apparent nausea or vomiting Anesthetic complications: no   No notable events documented.  Last Vitals:  Vitals:   04/09/23 1400 04/09/23 1415  BP: (!) 153/94 (!) 155/93  Pulse: (!) 107 87  Resp: 18 17  Temp: (!) 36.2 C   SpO2: 96% 95%    Last Pain:  Vitals:   04/09/23 1415  TempSrc:   PainSc: 0-No pain                 Linton Rump

## 2023-04-09 NOTE — Op Note (Signed)
04/09/2023 [facility]                              OPERATIVE REPORT   PREOPERATIVE DIAGNOSIS:  Right carpal tunnel syndrome.  POSTOPERATIVE DIAGNOSIS:  Right carpal tunnel syndrome.  PROCEDURE:  Right carpal tunnel release.  SURGEON:  Betha Loa, MD  ASSISTANT:  none.  ANESTHESIA: Bier block with sedation  IV FLUIDS:  Per anesthesia flow sheet.  ESTIMATED BLOOD LOSS:  Minimal.  COMPLICATIONS:  None.  SPECIMENS:  None.  TOURNIQUET TIME:    Total Tourniquet Time Documented: Forearm (Right) - 26 minutes Total: Forearm (Right) - 26 minutes   DISPOSITION:  Stable to PACU.  LOCATION: [facility]  INDICATIONS:  37 y.o. yo female with numbness and tingling right hand.  Positive nerve conduction studies. She wishes to proceed with right carpal tunnel release.  Risks, benefits and alternatives of surgery were discussed including the risk of blood loss; infection; damage to nerves, vessels, tendons, ligaments, bone; failure of surgery; need for additional surgery; complications with wound healing; continued pain; recurrence of carpal tunnel syndrome; and damage to motor branch. She voiced understanding of these risks and elected to proceed.   OPERATIVE COURSE:  After being identified preoperatively by myself, the patient and I agreed upon the procedure and site of procedure.  The surgical site was marked.  Surgical consent had been signed.  She was given IV vancomycin as preoperative antibiotic prophylaxis.  She was transferred to the operating room and placed on the operating room table in supine position with the Right upper extremity on an armboard.  Bier block anesthesia was induced by the anesthesiologist.  Right upper extremity was prepped and draped in normal sterile orthopaedic fashion.  A surgical pause was performed between the surgeons, anesthesia, and operating room staff, and all were in agreement as to the patient, procedure, and site of  procedure.  Tourniquet at the proximal aspect of the forearm had been inflated for the Bier block  Incision was made over the transverse carpal ligament and carried into the subcutaneous tissues by spreading technique.  Bipolar electrocautery was used to obtain hemostasis.  The palmar fascia was sharply incised.  The transverse carpal ligament was identified.  The fascia distal to the ligament was opened.  Retractor was placed and the flexor tendons were identified.  The flexor tendon to the little finger was identified and retracted radially.  The transverse carpal ligament was then incised from distal to proximal under direct visualization.  Scissors were used to split the distal aspect of the volar antebrachial fascia.  A finger was placed into the wound to ensure complete decompression, which was the case.  The nerve was examined.  It was flattened and hyperemic.  The motor branch was identified and was intact.  The wound was copiously irrigated with sterile saline.  It was then closed with 4-0 nylon in a horizontal mattress fashion.  It was injected with 0.25% plain Marcaine to aid in postoperative analgesia.  It was dressed with sterile Xeroform, 4x4s, an ABD, and wrapped with Kerlix and an Ace bandage.  Tourniquet was deflated at 26 minutes.  Fingertips were pink with brisk capillary refill after deflation of the tourniquet.  Operative drapes were broken down.  The patient was awoken from anesthesia safely.  She was transferred back to stretcher and taken to the PACU in stable condition.  I will see her back in the  office in 1 week for postoperative followup.  I will give her a prescription for Tramadol 50 mg 1-2 tabs PO q6 hours prn pain, dispense # 20.    Betha Loa, MD Electronically signed, 04/09/23

## 2023-04-09 NOTE — H&P (Signed)
Patricia Morse is an 37 y.o. female.   Chief Complaint: carpal tunnel syndrome HPI: 37 y.o. yo female with numbness and tingling right hand.  Positive nerve conduction studies. She wishes to have right carpal tunnel release.   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   RESIDUAL ABSENCE OF KNEE REFLEX   Internal derangement of right knee    PONV (postoperative nausea and vomiting) POSTOP COCHLEAR  IMPLANT 2006   Pregnancy induced hypertension    Seasonal allergies     Past Surgical History:  Procedure Laterality Date   BILATERAL MIDDLE EAR EXPLORATION/ SEALING OF OVAL AND ROUND WINDOW FISTULAS  1999 (AGE 44)   PERILYMPH FISTULAS   CHONDROPLASTY  03/13/2012   Procedure: CHONDROPLASTY;  Surgeon: Shelda Pal, MD;  Location: [facility];  Service: Orthopedics;  Laterality: Right;   COCHLEAR IMPLANT  11-15-2005  (AGE 52)   LEFT EAR  (CLARION HIGH-RESOLUTION 90K MULTICHANNEL,  1-J ELECTRODE)   COCHLEAR IMPLANT Right 11/12/2018   Procedure: COCHLEAR IMPLANT RIGHT EAR;  Surgeon: Ermalinda Barrios, MD;  Location: [facility];  Service: ENT;  Laterality: Right;   KNEE ARTHROSCOPY  03/13/2012   Procedure: ARTHROSCOPY KNEE;  Surgeon: Shelda Pal, MD;  Location: [facility];  Service: Orthopedics;  Laterality: Right;  RIGHT KNEE DIAGNOSTIC AND OPERATIVE SCOPE   WISDOM TOOTH EXTRACTION  AGE 19   DENTAL OFFICE    Family History: Family  History  Problem Relation Age of Onset   Arthritis Mother    Anxiety disorder Mother    Depression Mother    Hypertension Mother    Mental illness Mother    Hashimoto's thyroiditis Mother    Hypertension Father    Hyperlipidemia Father    Anxiety disorder Maternal Grandmother    Colon cancer Maternal Grandfather     Social History:   reports that she quit smoking about 15 years ago. Her smoking use included cigarettes. She has never used smokeless tobacco. She reports that she does not currently use alcohol after a past usage of about 3.0 - 5.0 standard drinks of alcohol per week. She reports that she does not use drugs.  Medications: Medications Prior to Admission  Medication Sig Dispense Refill   ALPRAZolam (XANAX) 0.25 MG tablet Take 1 tablet (0.25 mg total) by mouth 2 (two) times daily as needed for anxiety. 20 tablet 0   atorvastatin (LIPITOR) 40 MG tablet Take 40 mg by mouth at bedtime.     calcium carbonate (TUMS - DOSED IN MG ELEMENTAL CALCIUM) 500 MG chewable tablet Chew 1 tablet by mouth daily as needed for indigestion or heartburn.     cetirizine (ZYRTEC) 10 MG tablet Take 10 mg by mouth daily.     DULoxetine (CYMBALTA) 30 MG capsule Take 1 capsule (30 mg total) by mouth daily. Take with 60 mg of cymbalta to equal 90 mg total (Patient  taking differently: Take 60 mg by mouth daily. Take with 60 mg of cymbalta to equal 90 mg total) 30 capsule 0   norethindrone (MICRONOR) 0.35 MG tablet Take 1 tablet by mouth daily.     Olopatadine HCl (PATADAY OP) Apply to eye.     Probiotic Product (PROBIOTIC DAILY PO) Take 1 tablet by mouth daily.     valACYclovir (VALTREX) 1000 MG tablet Use as directed for suppression 90 tablet 1    Results for orders placed or performed during the hospital encounter of 04/09/23 (from the past 48 hour(s))  Pregnancy, urine POC     Status: None   Collection Time: 04/09/23 11:52 AM  Result Value Ref Range   Preg Test, Ur NEGATIVE NEGATIVE    Comment:         THE SENSITIVITY OF THIS METHODOLOGY IS >24 mIU/mL     No results found.    Blood pressure 125/88, pulse 97, temperature (!) 97.1 F (36.2 C), temperature source Temporal, resp. rate 18, height 5\' 5"  (1.651 m), weight 102.3 kg, SpO2 99 %, not currently breastfeeding.  General appearance: alert, cooperative, and appears stated age Head: Normocephalic, without obvious abnormality, atraumatic Neck: supple, symmetrical, trachea midline Extremities: Intact sensation and capillary refill all digits.  +epl/fpl/io.  No wounds.  Pulses: 2+ and symmetric Skin: Skin color, texture, turgor normal. No rashes or lesions Neurologic: Grossly normal Incision/Wound: none  Assessment/Plan Right carpal tunnel syndrome.  Non operative and operative treatment options have been discussed with the patient and patient wishes to proceed with operative treatment. Risks, benefits, and alternatives of surgery have been discussed and the patient agrees with the plan of care.   Betha Loa 04/09/2023, 12:52 PM

## 2023-04-09 NOTE — Discharge Instructions (Addendum)

## 2023-04-09 NOTE — Anesthesia Preprocedure Evaluation (Addendum)
Anesthesia Evaluation  Patient identified by MRN, date of birth, ID band Patient awake    Reviewed: Allergy & Precautions, NPO status , Patient's Chart, lab work & pertinent test results  History of Anesthesia Complications (+) PONV and history of anesthetic complications  Airway Mallampati: II  TM Distance: >3 FB Neck ROM: Full    Dental  (+) Dental Advisory Given   Pulmonary neg shortness of breath, neg sleep apnea, neg COPD, neg recent URI, former smoker   Pulmonary exam normal breath sounds clear to auscultation       Cardiovascular hypertension, (-) angina (-) Past MI, (-) Cardiac Stents and (-) CABG (-) dysrhythmias  Rhythm:Regular Rate:Normal  HLD   Neuro/Psych  PSYCHIATRIC DISORDERS Anxiety Depression    negative neurological ROS     GI/Hepatic negative GI ROS, Neg liver ROS,,,  Endo/Other  negative endocrine ROS    Renal/GU negative Renal ROS     Musculoskeletal   Abdominal  (+) + obese  Peds  Hematology negative hematology ROS (+)   Anesthesia Other Findings   Reproductive/Obstetrics                             Anesthesia Physical Anesthesia Plan  ASA: 2  Anesthesia Plan: MAC and Bier Block and Bier Block-Lidocaine Only   Post-op Pain Management:    Induction: Intravenous  PONV Risk Score and Plan: 3 and Ondansetron, Dexamethasone and Treatment may vary due to age or medical condition  Airway Management Planned: Natural Airway and Simple Face Mask  Additional Equipment:   Intra-op Plan:   Post-operative Plan:   Informed Consent: I have reviewed the patients History and Physical, chart, labs and discussed the procedure including the risks, benefits and alternatives for the proposed anesthesia with the patient or authorized representative who has indicated his/her understanding and acceptance.     Dental advisory given  Plan Discussed with: CRNA and  Anesthesiologist  Anesthesia Plan Comments: (Discussed with patient risks of MAC including, but not limited to, minor pain or discomfort, hearing people in the room, and possible need for backup general anesthesia. Risks for general anesthesia also discussed including, but not limited to, sore throat, hoarse voice, chipped/damaged teeth, injury to vocal cords, nausea and vomiting, allergic reactions, lung infection, heart attack, stroke, and death. All questions answered. )        Anesthesia Quick Evaluation

## 2023-04-09 NOTE — Transfer of Care (Signed)
Immediate Anesthesia Transfer of Care Note  Patient: Patricia Morse  Procedure(s) Performed: RIGHT CARPAL TUNNEL RELEASE (Right: Hand)  Patient Location: PACU  Anesthesia Type:MAC and Bier block  Level of Consciousness: awake, alert , and oriented  Airway & Oxygen Therapy: Patient Spontanous Breathing and Patient connected to face mask oxygen  Post-op Assessment: Report given to RN and Post -op Vital signs reviewed and stable  Post vital signs: Reviewed and stable  Last Vitals:  Vitals Value Taken Time  BP    Temp    Pulse 107 04/09/23 1354  Resp    SpO2 90 % 04/09/23 1354  Vitals shown include unvalidated device data.  Last Pain:  Vitals:   04/09/23 1204  TempSrc: Temporal  PainSc: 0-No pain      Patients Stated Pain Goal: 3 (04/09/23 1204)  Complications: No notable events documented.

## 2023-04-10 ENCOUNTER — Encounter (HOSPITAL_BASED_OUTPATIENT_CLINIC_OR_DEPARTMENT_OTHER): Payer: Self-pay | Admitting: Orthopedic Surgery

## 2023-04-14 ENCOUNTER — Encounter (HOSPITAL_BASED_OUTPATIENT_CLINIC_OR_DEPARTMENT_OTHER): Payer: Self-pay

## 2023-04-14 ENCOUNTER — Emergency Department (HOSPITAL_BASED_OUTPATIENT_CLINIC_OR_DEPARTMENT_OTHER)
Admission: EM | Admit: 2023-04-14 | Discharge: 2023-04-14 | Disposition: A | Discharge status: Home / Self Care | Payer: BC Managed Care – PPO | Attending: Emergency Medicine | Admitting: Emergency Medicine

## 2023-04-14 ENCOUNTER — Other Ambulatory Visit: Payer: Self-pay

## 2023-04-14 ENCOUNTER — Emergency Department (HOSPITAL_BASED_OUTPATIENT_CLINIC_OR_DEPARTMENT_OTHER): Payer: BC Managed Care – PPO

## 2023-04-14 DIAGNOSIS — G5133 Clonic hemifacial spasm, bilateral: Secondary | ICD-10-CM | POA: Insufficient documentation

## 2023-04-14 DIAGNOSIS — R252 Cramp and spasm: Secondary | ICD-10-CM | POA: Diagnosis present

## 2023-04-14 DIAGNOSIS — G5139 Clonic hemifacial spasm, unspecified: Secondary | ICD-10-CM

## 2023-04-14 LAB — CBC WITH DIFFERENTIAL/PLATELET
Abs Immature Granulocytes: 0.03 10*3/uL (ref 0.00–0.07)
Basophils Absolute: 0 10*3/uL (ref 0.0–0.1)
Basophils Relative: 0 %
Eosinophils Absolute: 0.1 10*3/uL (ref 0.0–0.5)
Eosinophils Relative: 1 %
HCT: 40.5 % (ref 36.0–46.0)
Hemoglobin: 14.1 g/dL (ref 12.0–15.0)
Immature Granulocytes: 0 %
Lymphocytes Relative: 34 %
Lymphs Abs: 2.6 10*3/uL (ref 0.7–4.0)
MCH: 29.7 pg (ref 26.0–34.0)
MCHC: 34.8 g/dL (ref 30.0–36.0)
MCV: 85.3 fL (ref 80.0–100.0)
Monocytes Absolute: 0.6 10*3/uL (ref 0.1–1.0)
Monocytes Relative: 7 %
Neutro Abs: 4.5 10*3/uL (ref 1.7–7.7)
Neutrophils Relative %: 58 %
Platelets: 274 10*3/uL (ref 150–400)
RBC: 4.75 MIL/uL (ref 3.87–5.11)
RDW: 12.8 % (ref 11.5–15.5)
WBC: 7.7 10*3/uL (ref 4.0–10.5)
nRBC: 0 % (ref 0.0–0.2)

## 2023-04-14 LAB — BASIC METABOLIC PANEL
Anion gap: 10 (ref 5–15)
BUN: 10 mg/dL (ref 6–20)
CO2: 22 mmol/L (ref 22–32)
Calcium: 8.9 mg/dL (ref 8.9–10.3)
Chloride: 104 mmol/L (ref 98–111)
Creatinine, Ser: 0.66 mg/dL (ref 0.44–1.00)
GFR, Estimated: >60 mL/min (ref >60)
Glucose, Bld: 114 mg/dL — ABNORMAL HIGH (ref 70–99)
Potassium: 3.7 mmol/L (ref 3.5–5.1)
Sodium: 136 mmol/L (ref 135–145)

## 2023-04-14 LAB — MAGNESIUM: Magnesium: 2.1 mg/dL (ref 1.7–2.4)

## 2023-04-14 LAB — HCG, SERUM, QUALITATIVE: Preg, Serum: NEGATIVE

## 2023-04-14 MED ORDER — GABAPENTIN 300 MG PO CAPS
300.0000 mg | ORAL_CAPSULE | ORAL | Status: AC
  Administered 2023-04-14: 300 mg via ORAL
  Filled 2023-04-14: qty 1

## 2023-04-14 MED ORDER — LORAZEPAM 1 MG PO TABS: 1.0000 mg | ORAL_TABLET | Freq: Three times a day (TID) | ORAL | 0 refills | Status: DC | PRN

## 2023-04-14 MED ORDER — GABAPENTIN 300 MG PO CAPS: 300.0000 mg | ORAL_CAPSULE | Freq: Three times a day (TID) | ORAL | 0 refills | Status: DC

## 2023-04-14 MED ORDER — LORAZEPAM 1 MG PO TABS
1.0000 mg | ORAL_TABLET | ORAL | Status: AC
  Administered 2023-04-14: 1 mg via ORAL
  Filled 2023-04-14: qty 1

## 2023-04-14 MED ORDER — DIPHENHYDRAMINE HCL 25 MG PO CAPS
50.0000 mg | ORAL_CAPSULE | ORAL | Status: AC
  Administered 2023-04-14: 50 mg via ORAL
  Filled 2023-04-14: qty 2

## 2023-04-14 NOTE — ED Notes (Signed)
ED Provider with pt in triage 

## 2023-04-14 NOTE — ED Provider Notes (Incomplete)
[facility] AT [facility] Provider Note   CSN: [id number] Arrival date & time: 04/14/23  1419     History {Add pertinent medical, surgical, social history, OB history to HPI:1} Chief Complaint  Patient presents with   Spasms    Patricia Morse is a 37 y.o. female.  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  Patient reports that since today at noon has had facial twitching that started abruptly.  Says that she also gasp for air when this happened.  Started to involve her upper extremities as well.  Is on both sides of her body.  No history of similar presentations.  Only psychiatric medication is duloxetine that was decreased from 120 mg to 90 mg 2 weeks ago aside from her occasional use of Xanax for anxiety.  Recently had carpal tunnel surgery on her right hand and was on tramadol which she discontinued 2 days ago.        Home Medications Prior to Admission medications   Medication Sig Start Date End Date Taking? Authorizing Provider  ALPRAZolam (XANAX) 0.25 MG tablet Take 1 tablet (0.25 mg total) by mouth 2 (two) times daily as needed for anxiety. 08/13/22   Copland, Gwenlyn Found, MD  atorvastatin (LIPITOR) 40 MG tablet Take 40 mg by mouth at bedtime. 01/22/23   [provider]  calcium carbonate (TUMS - DOSED IN MG ELEMENTAL CALCIUM) 500 MG chewable tablet Chew 1 tablet by mouth daily as needed for indigestion or heartburn.    [provider]  cetirizine (ZYRTEC) 10 MG tablet Take 10 mg by mouth daily.    [provider]  DULoxetine (CYMBALTA) 30 MG capsule Take 1 capsule (30 mg total) by mouth daily. Take with 60 mg of cymbalta to equal 90 mg total Patient taking differently: Take 60 mg by mouth daily. Take with 60 mg of cymbalta to equal 90 mg total 03/18/23   Copland, Gwenlyn Found, MD  norethindrone  (MICRONOR) 0.35 MG tablet Take 1 tablet by mouth daily.    [provider]  Olopatadine HCl (PATADAY OP) Apply to eye.    [provider]  Probiotic Product (PROBIOTIC DAILY PO) Take 1 tablet by mouth daily.    [provider]  traMADol Janean Sark) 50 MG tablet 1-2 tabs PO q6 hours prn pain 04/09/23   Betha Loa, MD  valACYclovir (VALTREX) 1000 MG tablet Use as directed for suppression 03/14/22   Copland, Gwenlyn Found, MD      Allergies    Contrast media [iodinated contrast media], Amoxicillin, Hydrocodeine [dihydrocodeine], and Penicillins    Review of Systems   Review of Systems  Physical Exam Updated Vital Signs BP 111/74   Pulse (!) 125   Temp (!) 97.2 F (36.2 C) (Tympanic)   Resp (!) 22   LMP  (LMP Unknown) Comment: continuous birth control no periods  SpO2 97%  Physical Exam Vitals and nursing note reviewed.  Constitutional:      General: She is not in acute distress.    Appearance: She is well-developed.     Comments: Every 15 to 30 seconds patient has grimacing and gasping spasms.  Involves both sides of her face and trapezius.  No significant upper extremity involvement.  HENT:     Head: Normocephalic and atraumatic.     Right Ear: External ear normal.     Left Ear: External ear normal.  Nose: Nose normal.  Eyes:     Extraocular Movements: Extraocular movements intact.     Conjunctiva/sclera: Conjunctivae normal.     Pupils: Pupils are equal, round, and reactive to light.  Pulmonary:     Effort: Pulmonary effort is normal. No respiratory distress.  Musculoskeletal:     Cervical back: Normal range of motion and neck supple.     Right lower leg: No edema.     Left lower leg: No edema.  Skin:    General: Skin is warm and dry.  Neurological:     Mental Status: She is alert and oriented to person, place, and time. Mental status is at baseline.     Comments: MENTAL STATUS: AAOx3 CRANIAL NERVES: II: Pupils equal and reactive 4 mm BL, no  RAPD III, IV, VI: EOM intact, no gaze preference or deviation, no nystagmus. V: normal sensation to light touch in V1, V2, and V3 segments bilaterally VII: no facial weakness or asymmetry, no nasolabial fold flattening VIII: normal hearing to speech and finger friction IX, X: normal palatal elevation, no uvular deviation XI: 5/5 head turn and 5/5 shoulder shrug bilaterally XII: midline tongue protrusion MOTOR: 5/5 strength in R shoulder flexion, elbow flexion and extension, and grip strength. 5/5 strength in L shoulder flexion, elbow flexion and extension, and grip strength.  5/5 strength in R hip and knee flexion, knee extension, ankle plantar and dorsiflexion. 5/5 strength in L hip and knee flexion, knee extension, ankle plantar and dorsiflexion. SENSORY: Normal sensation to light touch in all extremities COORD: Normal finger to nose and heel to shin, no tremor, no dysmetria STATION: normal stance, no truncal ataxia GAIT: Shuffling gait at baseline per the patient's family  Psychiatric:        Mood and Affect: Mood normal.     ED Results / Procedures / Treatments   Labs (all labs ordered are listed, but only abnormal results are displayed) Labs Reviewed  BASIC METABOLIC PANEL - Abnormal; Notable for the following components:      Result Value   Glucose, Bld 114 (*)    All other components within normal limits  CBC WITH DIFFERENTIAL/PLATELET  MAGNESIUM  HCG, SERUM, QUALITATIVE    EKG EKG Interpretation  Date/Time:  Sunday Apr 14 2023 14:55:38 EDT Ventricular Rate:  112 PR Interval:  142 QRS Duration: 75 QT Interval:  322 QTC Calculation: 440 R Axis:   74 Text Interpretation: Sinus tachycardia Borderline T abnormalities, diffuse leads Baseline wander in lead(s) III aVF V3 Confirmed by Vonita Moss [id number] on 04/14/2023 4:31:03 PM  Radiology No results found.  Procedures Procedures  {Document cardiac monitor, telemetry assessment procedure when  appropriate:1}  Medications Ordered in ED Medications - No data to display  ED Course/ Medical Decision Making/ A&P Clinical Course as of 04/14/23 1817  Sun Apr 14, 2023  1804 300 tid gabapentin [RP]    Clinical Course User Index [RP] Rondel Baton, MD   {   Click here for ABCD2, HEART and other calculatorsREFRESH Note before signing :1}                          Medical Decision Making Amount and/or Complexity of Data Reviewed Radiology: ordered.  Risk Prescription drug management.   ***  {Document critical care time when appropriate:1} {Document review of labs and clinical decision tools ie heart score, Chads2Vasc2 etc:1}  {Document your independent review of radiology images, and any outside records:1} {Document your discussion  with family members, caretakers, and with consultants:1} {Document social determinants of health affecting pt's care:1} {Document your decision making why or why not admission, treatments were needed:1} Final Clinical Impression(s) / ED Diagnoses Final diagnoses:  None    Rx / DC Orders ED Discharge Orders     None

## 2023-04-14 NOTE — Discharge Instructions (Signed)
You were seen for your facial spasms in the emergency department.   At home, please start taking the Ativan 3 times daily as needed for your facial spasms and the gabapentin that we have prescribed you.  Do not take your Xanax while you are taking the Ativan.    Check your MyChart online for the results of any tests that had not resulted by the time you left the emergency department.   Follow-up with your primary doctor in 2-3 days regarding your visit.  Follow-up with neurology as soon as possible regarding your symptoms.  Return immediately to the emergency department if you experience any of the following: Loss of consciousness, difficulty breathing, or any other concerning symptoms.    Thank you for visiting our Emergency Department. It was a pleasure taking care of you today.

## 2023-04-14 NOTE — ED Triage Notes (Signed)
Pt sts she started having spasms to upper body around 12pm; she had a carpel tunnel release surg on Tues, but was otherwise feeling normal before this

## 2023-04-14 NOTE — ED Notes (Signed)
Patient ambulated to CT

## 2023-04-21 ENCOUNTER — Encounter: Payer: Self-pay | Admitting: Family Medicine

## 2023-04-25 ENCOUNTER — Encounter: Payer: Self-pay | Admitting: Family Medicine

## 2023-04-27 NOTE — Progress Notes (Unsigned)
[facility] [address], [address] [place], [place] [address] [phone]  Date:  04/29/2023   Name:  Patricia Morse   DOB:  03/30/1986   MRN:  [id number]  PCP:  Pearline Cables, MD    Chief Complaint: No chief complaint on file.   History of Present Illness:  Patricia Morse is a 37 y.o. very pleasant female patient who presents with the following:  Patient seen today for follow-up from recent emergency room visit Most recent visit with myself was for her physical in April Generally healthy young woman with history of hearing loss and cochlear implant, also obesity, dyslipidemia  She was seen in the emergency room on 5/19 with some twitching of her facial muscles-apparently this started abruptly the same day and was associated with gasping for air.. The ER doc touched base with neurology, they recommended a benzodiazepine and gabapentin Did not think this represented seizures  Patient Active Problem List   Diagnosis Date Noted   Maternal anemia, with delivery 01/15/2022   Encounter for induction of labor 01/13/2022   Gestational hypertension 01/13/2022   SVD 2/18 01/13/2022   Postpartum care following vaginal delivery 2/18 01/13/2022   Second degree perineal laceration 01/13/2022   Rubella non-immune 01/13/2022   Depression with anxiety 03/17/2020   Hearing loss 10/10/2012    Past Medical History:  Diagnosis Date   Allergy    Auditory neuropathy BILATERAL   Bilateral sensorineural hearing loss    SECONDARY AUDITORY NEUROPATHY   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     Depression    History of encephalitis    unsure if it was encephalitis or guillian barre  no flu shots   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   RESIDUAL ABSENCE OF KNEE REFLEX   Internal derangement of right knee    PONV (postoperative nausea and vomiting) POSTOP COCHLEAR  IMPLANT 2006   Pregnancy induced hypertension     Seasonal allergies     Past Surgical History:  Procedure Laterality Date   BILATERAL MIDDLE EAR EXPLORATION/ SEALING OF OVAL AND ROUND WINDOW FISTULAS  1999 (AGE 26)   PERILYMPH FISTULAS   CARPAL TUNNEL RELEASE Right 04/09/2023   Procedure: RIGHT CARPAL TUNNEL RELEASE;  Surgeon: Betha Loa, MD;  Location: [facility];  Service: Orthopedics;  Laterality: Right;  Bier block   CHONDROPLASTY  03/13/2012   Procedure: CHONDROPLASTY;  Surgeon: Shelda Pal, MD;  Location: [facility];  Service: Orthopedics;  Laterality: Right;   COCHLEAR IMPLANT  11-15-2005  (AGE 84)   LEFT EAR  (CLARION HIGH-RESOLUTION 90K MULTICHANNEL,  1-J ELECTRODE)   COCHLEAR IMPLANT Right 11/12/2018   Procedure: COCHLEAR IMPLANT RIGHT EAR;  Surgeon: Ermalinda Barrios, MD;  Location: [facility];  Service: ENT;  Laterality: Right;   KNEE ARTHROSCOPY  03/13/2012   Procedure: ARTHROSCOPY KNEE;  Surgeon: Shelda Pal, MD;  Location: [facility];  Service: Orthopedics;  Laterality: Right;  RIGHT KNEE DIAGNOSTIC AND OPERATIVE SCOPE   WISDOM TOOTH EXTRACTION  AGE 72   DENTAL OFFICE    Social History   Tobacco Use   Smoking status: Former    Types: Cigarettes    Quit date: 03/10/2008    Years since quitting: 15.1   Smokeless tobacco: Never  Vaping Use   Vaping Use: Never used  Substance Use Topics   Alcohol use: Not Currently  Alcohol/week: 3.0 - 5.0 standard drinks of alcohol    Types: 3 - 5 Glasses of wine per week    Comment: OCCASIONAL, "social"    Drug use: No    Family History  Problem Relation Age of Onset   Arthritis Mother    Anxiety disorder Mother    Depression Mother    Hypertension Mother    Mental illness Mother    Hashimoto's thyroiditis Mother    Hypertension Father    Hyperlipidemia Father    Anxiety disorder Maternal Grandmother    Colon cancer Maternal Grandfather     Allergies  Allergen Reactions   Contrast Media [Iodinated  Contrast Media] Itching    Swelling, high heart rate   Amoxicillin Hives   Hydrocodeine [Dihydrocodeine] Rash   Penicillins Rash    Medication list has been reviewed and updated.  Current Outpatient Medications on File Prior to Visit  Medication Sig Dispense Refill   ALPRAZolam (XANAX) 0.25 MG tablet Take 1 tablet (0.25 mg total) by mouth 2 (two) times daily as needed for anxiety. 20 tablet 0   atorvastatin (LIPITOR) 40 MG tablet Take 40 mg by mouth at bedtime.     calcium carbonate (TUMS - DOSED IN MG ELEMENTAL CALCIUM) 500 MG chewable tablet Chew 1 tablet by mouth daily as needed for indigestion or heartburn.     cetirizine (ZYRTEC) 10 MG tablet Take 10 mg by mouth daily.     DULoxetine (CYMBALTA) 30 MG capsule Take 1 capsule (30 mg total) by mouth daily. Take with 60 mg of cymbalta to equal 90 mg total (Patient taking differently: Take 60 mg by mouth daily. Take with 60 mg of cymbalta to equal 90 mg total) 30 capsule 0   gabapentin (NEURONTIN) 300 MG capsule Take 1 capsule (300 mg total) by mouth 3 (three) times daily. 90 capsule 0   LORazepam (ATIVAN) 1 MG tablet Take 1 tablet (1 mg total) by mouth 3 (three) times daily as needed (spasms). 20 tablet 0   norethindrone (MICRONOR) 0.35 MG tablet Take 1 tablet by mouth daily.     Olopatadine HCl (PATADAY OP) Apply to eye.     Probiotic Product (PROBIOTIC DAILY PO) Take 1 tablet by mouth daily.     traMADol (ULTRAM) 50 MG tablet 1-2 tabs PO q6 hours prn pain 20 tablet 0   valACYclovir (VALTREX) 1000 MG tablet Use as directed for suppression 90 tablet 1   No current facility-administered medications on file prior to visit.    Review of Systems:  As per HPI- otherwise negative.   Physical Examination: There were no vitals filed for this visit. There were no vitals filed for this visit. There is no height or weight on file to calculate BMI. Ideal Body Weight:    GEN: no acute distress. HEENT: Atraumatic, Normocephalic.  Ears and  Nose: No external deformity. CV: RRR, No M/G/R. No JVD. No thrill. No extra heart sounds. PULM: CTA B, no wheezes, crackles, rhonchi. No retractions. No resp. distress. No accessory muscle use. ABD: S, NT, ND, +BS. No rebound. No HSM. EXTR: No c/c/e PSYCH: Normally interactive. Conversant.    Assessment and Plan: ***  Signed Abbe Amsterdam, MD

## 2023-04-29 ENCOUNTER — Ambulatory Visit: Payer: BC Managed Care – PPO | Admitting: Family Medicine

## 2023-04-29 VITALS — BP 116/60 | HR 90 | Temp 98.0°F | Resp 18 | Ht 65.0 in | Wt 222.0 lb

## 2023-04-29 DIAGNOSIS — R253 Fasciculation: Secondary | ICD-10-CM | POA: Diagnosis not present

## 2023-04-29 NOTE — Patient Instructions (Signed)
It was good to see you today. I would recommend seeing neurology but otherwise I don't think anything else needed unless you have these symptoms again!  Please keep me posted

## 2023-06-11 ENCOUNTER — Other Ambulatory Visit: Payer: Self-pay | Admitting: Family Medicine

## 2023-06-25 ENCOUNTER — Encounter: Payer: Self-pay | Admitting: Family Medicine

## 2023-06-25 NOTE — Telephone Encounter (Signed)

## 2023-06-28 ENCOUNTER — Encounter: Payer: Self-pay | Admitting: Family

## 2023-06-28 ENCOUNTER — Ambulatory Visit: Payer: BC Managed Care – PPO | Admitting: Family

## 2023-06-28 VITALS — BP 128/84 | HR 105 | Resp 18 | Ht 65.0 in | Wt 223.8 lb

## 2023-06-28 DIAGNOSIS — H00012 Hordeolum externum right lower eyelid: Secondary | ICD-10-CM

## 2023-06-28 MED ORDER — TOBRAMYCIN 0.3 % OP SOLN: 1.0000 [drp] | Freq: Four times a day (QID) | OPHTHALMIC | 0 refills | Status: DC

## 2023-06-28 NOTE — Progress Notes (Signed)
Patricia Morse is a 37 y.o. female with the following history as recorded in [employer]:  Patient Active Problem List   Diagnosis Date Noted   Maternal anemia, with delivery 01/15/2022   Encounter for induction of labor 01/13/2022   Gestational hypertension 01/13/2022   SVD 2/18 01/13/2022   Postpartum care following vaginal delivery 2/18 01/13/2022   Second degree perineal laceration 01/13/2022   Rubella non-immune 01/13/2022   Depression with anxiety 03/17/2020   Hearing loss 10/10/2012    Current Outpatient Medications  Medication Sig Dispense Refill   ALPRAZolam (XANAX) 0.25 MG tablet Take 1 tablet (0.25 mg total) by mouth 2 (two) times daily as needed for anxiety. 20 tablet 0   calcium carbonate (TUMS - DOSED IN MG ELEMENTAL CALCIUM) 500 MG chewable tablet Chew 1 tablet by mouth daily as needed for indigestion or heartburn.     cetirizine (ZYRTEC) 10 MG tablet Take 10 mg by mouth daily.     DULoxetine (CYMBALTA) 30 MG capsule Take 1 capsule (30 mg total) by mouth daily. Take with 60 mg of cymbalta to equal 90 mg total (Patient taking differently: Take 60 mg by mouth daily. Take with 60 mg of cymbalta to equal 90 mg total) 30 capsule 0   Probiotic Product (PROBIOTIC DAILY PO) Take 1 tablet by mouth daily.     tobramycin (TOBREX) 0.3 % ophthalmic solution Place 1 drop into the right eye every 6 (six) hours. 5 mL 0   valACYclovir (VALTREX) 1000 MG tablet USE AS DIRECTED FOR SUPPRESSION 90 tablet 3   atorvastatin (LIPITOR) 40 MG tablet Take 40 mg by mouth at bedtime. (Patient not taking: Reported on 04/29/2023)     gabapentin (NEURONTIN) 300 MG capsule Take 1 capsule (300 mg total) by mouth 3 (three) times daily. 90 capsule 0   LORazepam (ATIVAN) 1 MG tablet Take 1 tablet (1 mg total) by mouth 3 (three) times daily as needed (spasms). (Patient not taking: Reported on 06/28/2023) 20 tablet 0   No current facility-administered medications for this visit.    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   RESIDUAL ABSENCE OF KNEE REFLEX   Internal derangement of right knee    PONV (postoperative nausea and vomiting) POSTOP COCHLEAR  IMPLANT 2006   Pregnancy induced hypertension    Seasonal allergies     Past Surgical History:  Procedure Laterality Date   BILATERAL MIDDLE EAR EXPLORATION/ SEALING OF OVAL AND ROUND WINDOW FISTULAS  1999 (AGE 38)   PERILYMPH FISTULAS   CARPAL TUNNEL RELEASE Right 04/09/2023   Procedure: RIGHT CARPAL TUNNEL RELEASE;  Surgeon: Betha Loa, MD;  Location: [facility];  Service: Orthopedics;  Laterality: Right;  Bier block   CHONDROPLASTY  03/13/2012   Procedure: CHONDROPLASTY;  Surgeon: Shelda Pal, MD;  Location: [facility];  Service: Orthopedics;  Laterality: Right;   COCHLEAR IMPLANT  11-15-2005  (AGE 75)   LEFT EAR  (CLARION HIGH-RESOLUTION 90K MULTICHANNEL,  1-J ELECTRODE)   COCHLEAR IMPLANT Right 11/12/2018   Procedure: COCHLEAR IMPLANT  RIGHT EAR;  Surgeon: Ermalinda Barrios, MD;  Location: [facility];  Service: ENT;  Laterality: Right;   KNEE ARTHROSCOPY  03/13/2012   Procedure: ARTHROSCOPY KNEE;  Surgeon: Shelda Pal, MD;  Location: [facility];  Service: Orthopedics;  Laterality: Right;  RIGHT KNEE DIAGNOSTIC AND OPERATIVE SCOPE   WISDOM TOOTH EXTRACTION  AGE 10   DENTAL OFFICE    Family History  Problem Relation Age of Onset   Arthritis Mother    Anxiety disorder Mother    Depression Mother    Hypertension Mother    Mental illness Mother     Hashimoto's thyroiditis Mother    Hypertension Father    Hyperlipidemia Father    Anxiety disorder Maternal Grandmother    Colon cancer Maternal Grandfather     Social History   Tobacco Use   Smoking status: Former    Current packs/day: 0.00    Types: Cigarettes    Quit date: 03/10/2008    Years since quitting: 15.3   Smokeless tobacco: Never  Substance Use Topics   Alcohol use: Not Currently    Alcohol/week: 3.0 - 5.0 standard drinks of alcohol    Types: 3 - 5 Glasses of wine per week    Comment: OCCASIONAL, "social"     Subjective:   Concerned for stye x 2 weeks; has been applying warm compresses with little relief; located on lower right lid;   Objective:  Vitals:   06/28/23 0943  BP: 128/84  Pulse: (!) 105  Resp: 18  SpO2: 99%  Weight: 223 lb 12.8 oz (101.5 kg)  Height: 5\' 5"  (1.651 m)    General: Well developed, well nourished, in no acute distress  Skin : Warm and dry.  Head: Normocephalic and atraumatic  Eyes: Sclera and conjunctiva clear; pupils round and reactive to light; extraocular movements intact; stye noted on lower right lid Lungs: Respirations unlabored;  Neurologic: Alert and oriented; speech intact; face symmetrical; moves all extremities well; CNII-XII intact without focal deficit   Assessment:  1. Hordeolum externum of right lower eyelid     Plan:  Continue to apply warm compresses/ Rx for Tobramycin- please use as directed; follow up with her optometrist if symptoms persist.   No follow-ups on file.  No orders of the defined types were placed in this encounter.   Requested Prescriptions   Signed Prescriptions Disp Refills   tobramycin (TOBREX) 0.3 % ophthalmic solution 5 mL 0    Sig: Place 1 drop into the right eye every 6 (six) hours.

## 2023-06-28 NOTE — Patient Instructions (Signed)
If you aren't improved by Monday, please call your eye doctor to schedule an OV.   Please keep using the warm compresses.

## 2023-07-25 ENCOUNTER — Encounter: Payer: Self-pay | Admitting: Neurology

## 2023-07-25 ENCOUNTER — Ambulatory Visit (INDEPENDENT_AMBULATORY_CARE_PROVIDER_SITE_OTHER): Payer: BC Managed Care – PPO | Admitting: Neurology

## 2023-07-25 VITALS — BP 131/86 | HR 73

## 2023-07-25 DIAGNOSIS — G245 Blepharospasm: Secondary | ICD-10-CM | POA: Diagnosis not present

## 2023-07-25 NOTE — Progress Notes (Signed)
Chief Complaint  Patient presents with   New Patient (Initial Visit)    Rm15, alone  facial spasm:hasn't happened since er but it started in her face and spread to whole body. Bilateral hearing loss.  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       ASSESSMENT AND PLAN  [name] is a 37 y.o. female   Uncontrollable facial and body jerking movement,  EEG to rule out abnormal discharge, especially in light of her history of childhood illness leading to hearing loss bilaterally,  Will inform her of result, she will only return to clinic if she has recurrent spells again.  DIAGNOSTIC DATA (LABS, IMAGING, TESTING) - I reviewed patient records, labs, notes, testing and imaging myself where available.   MEDICAL HISTORY:  Patricia Morse is a 37 year old female, seen in request by her primary care physician Dr. Abbe Amsterdam for evaluation of facial spasm, initial evaluation July 25, 2019 for  I reviewed and summarized the referring note.PMHX. 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 S/p cochlear implant Obesity  She has a history of anxiety, has been under treatment for many years, currently taking Cymbalta 60 mg daily, hearing loss, due to childhood illness, status post cochlear implants  On Apr 14, 2023, she was putting down her 69-month-old son napping, suddenly felt bilateral facial muscle spasm, later also noticed intermittent arm jerking movement, was taken by her husband to the emergency room, she denied loss of consciousness, no history of seizure, episode lasted about 6 hours, alleviated by Ativan, ER physician also documented gasping for air,   EG was normal elevated ventricular rate of 112,  Personally reviewed CT head without contrast, artifact from cochlear implant, otherwise no acute intracranial abnormality.  Lab from 2024 showed normal or negative magnesium, CBC, cortisol vitamin D level TSH, A1c,  BMP  PHYSICAL EXAM:   Vitals:   07/25/23 1523 07/25/23 1528  BP: (!) 138/90 131/86  Pulse: 73    There is no height or weight on file to calculate BMI.  PHYSICAL EXAMNIATION:  Gen: NAD, conversant, well nourised, well groomed                     Cardiovascular: Regular rate rhythm, no peripheral edema, warm, nontender. Eyes: Conjunctivae clear without exudates or hemorrhage Neck: Supple, no carotid bruits. Pulmonary: Clear to auscultation bilaterally   NEUROLOGICAL EXAM:  MENTAL STATUS: Speech/cognition: Awake, alert, oriented to history taking and casual conversation CRANIAL NERVES: CN II: Visual fields are full to confrontation. Pupils are round equal and briskly reactive to light. CN III, IV, VI: extraocular movement are normal. No ptosis. CN V: Facial sensation is intact to light touch CN VII: Face is symmetric with normal eye closure  CN VIII: Hard of hearing, wearing cochlear implant, CN IX, X: Phonation is normal. CN XI: Head turning and shoulder shrug are intact  MOTOR: There is no pronator drift of out-stretched arms. Muscle bulk and tone are normal. Muscle strength is normal.  REFLEXES: Reflexes are 2+ and symmetric at the biceps, triceps, knees, and ankles. Plantar responses are flexor.  SENSORY: Intact to light touch, pinprick and vibratory sensation are intact in fingers and toes.  COORDINATION: There is no trunk or limb dysmetria noted.  GAIT/STANCE: Posture is normal. Gait is steady with normal steps, base, arm swing, and turning. Heel and toe walking are normal. Tandem gait is normal.  Romberg is absent.  REVIEW OF SYSTEMS:  Full 14 system review of systems performed and notable only for as above All other review of systems were negative.   ALLERGIES: Allergies  Allergen Reactions   Amoxicillin Hives and Rash   Contrast Media [Iodinated Contrast Media] Itching    Swelling, high heart rate   Iodine Anaphylaxis and Itching    Swelling, high  heart rate   Hydrocodone     Other Reaction(s): Flushing   Hydrocodeine [Dihydrocodeine] Rash   Penicillins Rash    HOME MEDICATIONS: Current Outpatient Medications  Medication Sig Dispense Refill   ALPRAZolam (XANAX) 0.25 MG tablet Take 1 tablet (0.25 mg total) by mouth 2 (two) times daily as needed for anxiety. 20 tablet 0   aspirin EC 81 MG tablet Take 81 mg by mouth daily. Swallow whole.     calcium carbonate (TUMS - DOSED IN MG ELEMENTAL CALCIUM) 500 MG chewable tablet Chew 1 tablet by mouth daily as needed for indigestion or heartburn.     cetirizine (ZYRTEC) 10 MG tablet Take 10 mg by mouth daily.     DULoxetine (CYMBALTA) 30 MG capsule Take 1 capsule (30 mg total) by mouth daily. Take with 60 mg of cymbalta to equal 90 mg total (Patient taking differently: Take 60 mg by mouth daily. Take with 60 mg of cymbalta to equal 90 mg total) 30 capsule 0   Probiotic Product (PROBIOTIC DAILY PO) Take 1 tablet by mouth daily.     valACYclovir (VALTREX) 1000 MG tablet USE AS DIRECTED FOR SUPPRESSION 90 tablet 3   No current facility-administered medications for this visit.    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   RESIDUAL ABSENCE OF KNEE REFLEX   Internal derangement of right knee    PONV (postoperative nausea and vomiting) POSTOP COCHLEAR  IMPLANT 2006   Pregnancy induced hypertension    Seasonal allergies     PAST SURGICAL HISTORY: Past Surgical History:  Procedure Laterality Date   BILATERAL MIDDLE EAR EXPLORATION/ SEALING OF OVAL AND ROUND WINDOW FISTULAS  1999 (AGE 71)   PERILYMPH FISTULAS   CARPAL TUNNEL RELEASE Right 04/09/2023    Procedure: RIGHT CARPAL TUNNEL RELEASE;  Surgeon: Betha Loa, MD;  Location: [facility];  Service: Orthopedics;  Laterality: Right;  Bier block   CHONDROPLASTY  03/13/2012   Procedure: CHONDROPLASTY;  Surgeon: Shelda Pal, MD;  Location: [facility];  Service: Orthopedics;  Laterality: Right;   COCHLEAR IMPLANT  11-15-2005  (AGE 8)   LEFT EAR  (CLARION HIGH-RESOLUTION 90K MULTICHANNEL,  1-J ELECTRODE)   COCHLEAR IMPLANT Right 11/12/2018   Procedure: COCHLEAR IMPLANT RIGHT EAR;  Surgeon: Ermalinda Barrios, MD;  Location: [facility];  Service: ENT;  Laterality: Right;   KNEE ARTHROSCOPY  03/13/2012   Procedure: ARTHROSCOPY KNEE;  Surgeon: Shelda Pal, MD;  Location: [facility];  Service: Orthopedics;  Laterality: Right;  RIGHT KNEE DIAGNOSTIC AND OPERATIVE SCOPE   WISDOM TOOTH EXTRACTION  AGE 33   DENTAL OFFICE    FAMILY HISTORY: Family History  Problem Relation Age of Onset   Arthritis Mother  Anxiety disorder Mother    Depression Mother    Hypertension Mother    Mental illness Mother    Hashimoto's thyroiditis Mother    Hypertension Father    Hyperlipidemia Father    Anxiety disorder Maternal Grandmother    Colon cancer Maternal Grandfather     SOCIAL HISTORY: Social History   Socioeconomic History   Marital status: Married    Spouse name: Kathlene November   Number of children: 0   Years of education: Not on file   Highest education level: Bachelor's degree (e.g., BA, AB, BS)  Occupational History   Not on file  Tobacco Use   Smoking status: Former    Current packs/day: 0.00    Types: Cigarettes    Quit date: 03/10/2008    Years since quitting: 15.3   Smokeless tobacco: Never  Vaping Use   Vaping status: Never Used  Substance and Sexual Activity   Alcohol use: Not Currently    Alcohol/week: 3.0 - 5.0 standard drinks of alcohol    Types: 3 - 5 Glasses of wine per week    Comment: OCCASIONAL, "social"    Drug use:  No   Sexual activity: Yes    Partners: Male    Birth control/protection: Pill  Other Topics Concern   Not on file  Social History Narrative   At 61 months of age, 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 Guillain-Barre Syndrome (MD's unsure, therefore, doesn't take flu shot)   Social Determinants of Health   Financial Resource Strain: Not on file  Food Insecurity: Not on file  Transportation Needs: Not on file  Physical Activity: Not on file  Stress: Not on file  Social Connections: Unknown (04/09/2022)   Received from [facility]   Social Network    Social Network: Not on file  Intimate Partner Violence: Unknown (03/01/2022)   Received from [facility]   HITS    Physically Hurt: Not on file    Insult or Talk Down To: Not on file    Threaten Physical Harm: Not on file    Scream or Curse: Not on file      Levert Feinstein, M.D. Ph.D.  [facility] [address], [address] [place], [place] [address] Ph: [phone] Fax: [phone]  CC:  Pearline Cables, MD [address] [place],  [place] [address]  Copland, Gwenlyn Found, MD

## 2023-08-01 ENCOUNTER — Other Ambulatory Visit: Payer: Commercial Managed Care - PPO | Admitting: *Deleted

## 2023-08-07 ENCOUNTER — Ambulatory Visit: Payer: BC Managed Care – PPO | Admitting: Neurology

## 2023-08-07 DIAGNOSIS — R253 Fasciculation: Secondary | ICD-10-CM | POA: Diagnosis not present

## 2023-08-07 DIAGNOSIS — G245 Blepharospasm: Secondary | ICD-10-CM

## 2023-08-14 ENCOUNTER — Encounter: Payer: Self-pay | Admitting: Neurology

## 2023-08-22 ENCOUNTER — Encounter: Payer: Self-pay | Admitting: Family Medicine

## 2023-08-26 NOTE — Procedures (Signed)
   HISTORY: Intermittent uncontrollable body jerking movement,  TECHNIQUE:  This is a routine 16 channel EEG recording with one channel devoted to a limited EKG recording.  It was performed during wakefulness, drowsiness and asleep.  Hyperventilation and photic stimulation were performed as activating procedures.  There are frequent bilateral frontal artifact,.  Upon maximum arousal, posterior dominant waking rhythm consistent of rhythmic alpha range activity. Activities are symmetric over the bilateral posterior derivations and attenuated with eye opening.  Photic stimulation did not alter the tracing.  Hyperventilation produced mild/moderate buildup with higher amplitude and the slower activities noted.  During EEG recording, patient developed drowsiness and no deeper stage of sleep was achieved  during EEG recording, there was no epileptiform discharge noted.  EKG demonstrate normal sinus rhythm.  CONCLUSION: This is a  normal awake EEG.  There is no electrodiagnostic evidence of epileptiform discharge.  Levert Feinstein, M.D. Ph.D.  [facility] [address] [place], [place] [address] Phone: [phone] Fax:      [phone]

## 2023-08-26 NOTE — Telephone Encounter (Signed)
Pt called and LVM wanting to know when her EEG results will be available. Please advise.

## 2023-09-24 NOTE — Progress Notes (Unsigned)
[facility] [address], [address] [place], [place] [address] [phone]  Date:  09/25/2023   Name:  Patricia Morse   DOB:  [birth year]   MRN:  [id number]  PCP:  Pearline Cables, MD    Chief Complaint: No chief complaint on file.   History of Present Illness:  Patricia Morse is a 37 y.o. very pleasant female patient who presents with the following:  Patient seen today with concern of possible persistent athlete's foot Most recent visit with myself was in June at which time she had been experiencing muscle twitching and spasms symptoms.  She followed up with neurology but thankfully this seems to have resolved  Generally healthy young woman with history of hearing loss and cochlear implant, also obesity, dyslipidemia   Flu vaccine Pap smear Patient Active Problem List   Diagnosis Date Noted   Spasm eyelid 07/25/2023   Maternal anemia, with delivery 01/15/2022   Encounter for induction of labor 01/13/2022   Gestational hypertension 01/13/2022   SVD 2/18 01/13/2022   Postpartum care following vaginal delivery 2/18 01/13/2022   Second degree perineal laceration 01/13/2022   Rubella non-immune 01/13/2022   Depression with anxiety 03/17/2020   Hearing loss 10/10/2012    Past Medical History:  Diagnosis Date   Allergy    Auditory neuropathy BILATERAL   Bilateral sensorineural hearing loss    SECONDARY AUDITORY NEUROPATHY   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     Depression    History of encephalitis    unsure if it was encephalitis or guillian barre  no flu shots   History of viral encephalitis AS Morse   RESIDUAL ABSENCE OF KNEE REFLEX   Internal derangement of right knee    PONV (postoperative nausea and vomiting) POSTOP COCHLEAR  IMPLANT 2006   Pregnancy induced hypertension    Seasonal allergies     Past Surgical History:  Procedure Laterality Date   BILATERAL MIDDLE EAR EXPLORATION/  SEALING OF OVAL AND ROUND WINDOW FISTULAS  1999 (AGE 61)   PERILYMPH FISTULAS   CARPAL TUNNEL RELEASE Right 04/09/2023   Procedure: RIGHT CARPAL TUNNEL RELEASE;  Surgeon: Betha Loa, MD;  Location: [facility];  Service: Orthopedics;  Laterality: Right;  Bier block   CHONDROPLASTY  03/13/2012   Procedure: CHONDROPLASTY;  Surgeon: Shelda Pal, MD;  Location: [facility];  Service: Orthopedics;  Laterality: Right;   COCHLEAR IMPLANT  11-15-2005  (AGE 48)   LEFT EAR  (CLARION HIGH-RESOLUTION 90K MULTICHANNEL,  1-J ELECTRODE)   COCHLEAR IMPLANT Right 11/12/2018   Procedure: COCHLEAR IMPLANT RIGHT EAR;  Surgeon: Ermalinda Barrios, MD;  Location: [facility];  Service: ENT;  Laterality: Right;   KNEE ARTHROSCOPY  03/13/2012   Procedure: ARTHROSCOPY KNEE;  Surgeon: Shelda Pal, MD;  Location: [facility];  Service: Orthopedics;  Laterality: Right;  RIGHT KNEE DIAGNOSTIC AND OPERATIVE SCOPE   WISDOM TOOTH EXTRACTION  AGE 36   DENTAL OFFICE    Social History   Tobacco Use   Smoking status: Former    Current packs/day: 0.00    Types: Cigarettes    Quit date: 03/10/2008    Years since quitting: 15.5   Smokeless tobacco: Never  Vaping Use   Vaping status: Never Used  Substance Use Topics   Alcohol use: Not Currently    Alcohol/week: 3.0 - 5.0 standard drinks of alcohol  Types: 3 - 5 Glasses of wine per week    Comment: OCCASIONAL, "social"    Drug use: No    Family History  Problem Relation Age of Onset   Arthritis Mother    Anxiety disorder Mother    Depression Mother    Hypertension Mother    Mental illness Mother    Hashimoto's thyroiditis Mother    Hypertension Father    Hyperlipidemia Father    Anxiety disorder Maternal Grandmother    Colon cancer Maternal Grandfather     Allergies  Allergen Reactions   Amoxicillin Hives and Rash   Contrast Media [Iodinated Contrast Media] Itching    Swelling, high heart rate    Iodine Anaphylaxis and Itching    Swelling, high heart rate   Hydrocodone     Other Reaction(s): Flushing   Hydrocodeine [Dihydrocodeine] Rash   Penicillins Rash    Medication list has been reviewed and updated.  Current Outpatient Medications on File Prior to Visit  Medication Sig Dispense Refill   ALPRAZolam (XANAX) 0.25 MG tablet Take 1 tablet (0.25 mg total) by mouth 2 (two) times daily as needed for anxiety. 20 tablet 0   aspirin EC 81 MG tablet Take 81 mg by mouth daily. Swallow whole.     calcium carbonate (TUMS - DOSED IN MG ELEMENTAL CALCIUM) 500 MG chewable tablet Chew 1 tablet by mouth daily as needed for indigestion or heartburn.     cetirizine (ZYRTEC) 10 MG tablet Take 10 mg by mouth daily.     DULoxetine (CYMBALTA) 30 MG capsule Take 1 capsule (30 mg total) by mouth daily. Take with 60 mg of cymbalta to equal 90 mg total (Patient taking differently: Take 60 mg by mouth daily. Take with 60 mg of cymbalta to equal 90 mg total) 30 capsule 0   Probiotic Product (PROBIOTIC DAILY PO) Take 1 tablet by mouth daily.     valACYclovir (VALTREX) 1000 MG tablet USE AS DIRECTED FOR SUPPRESSION 90 tablet 3   No current facility-administered medications on file prior to visit.    Review of Systems:  As per HPI- otherwise negative.   Physical Examination: There were no vitals filed for this visit. There were no vitals filed for this visit. There is no height or weight on file to calculate BMI. Ideal Body Weight:    GEN: no acute distress. HEENT: Atraumatic, Normocephalic.  Ears and Nose: No external deformity. CV: RRR, No M/G/R. No JVD. No thrill. No extra heart sounds. PULM: CTA B, no wheezes, crackles, rhonchi. No retractions. No resp. distress. No accessory muscle use. ABD: S, NT, ND, +BS. No rebound. No HSM. EXTR: No c/c/e PSYCH: Normally interactive. Conversant.    Assessment and Plan: ***  Signed Abbe Amsterdam, MD

## 2023-09-25 ENCOUNTER — Ambulatory Visit: Payer: BC Managed Care – PPO | Admitting: Family Medicine

## 2023-09-25 ENCOUNTER — Encounter: Payer: Self-pay | Admitting: Family Medicine

## 2023-09-25 VITALS — BP 132/88 | HR 85 | Resp 18 | Ht 65.0 in | Wt 221.8 lb

## 2023-09-25 DIAGNOSIS — E669 Obesity, unspecified: Secondary | ICD-10-CM | POA: Diagnosis not present

## 2023-09-25 DIAGNOSIS — L02419 Cutaneous abscess of limb, unspecified: Secondary | ICD-10-CM

## 2023-09-25 NOTE — Patient Instructions (Addendum)
Try a Urea and salicylic acid cream which you can get either online or at some drugstores for your feet.  This will prevent thick calluses and keep them soft  We drained the small abscess under your right arm today.  Keep this clean and dry until tomorrow, then you can take a shower as you normally would.  Keep a light bandage over it as needed just so you do not get any blood in your clothing.  If it does not seem to be resolving or if it is getting worse or if you have any other concerns please let me know  We discussed using a GLP-1 agonist drug for weight loss today.  These medications can be very helpful.  The main roadblock tends to be insurance payment.  Please check with your insurance and see if they cover 1 of these medications for weight loss-as opposed to for diabetes.  If they do I am glad to give you a prescription.  If they do not, you might also check out [facility] online.  They offer compounded GLP-1 drugs at a cheaper cost then you can get at a regular pharmacy

## 2023-10-03 ENCOUNTER — Other Ambulatory Visit: Payer: Self-pay | Admitting: Family Medicine

## 2023-10-03 DIAGNOSIS — F418 Other specified anxiety disorders: Secondary | ICD-10-CM

## 2023-10-03 MED ORDER — ALPRAZOLAM 0.25 MG PO TABS
0.2500 mg | ORAL_TABLET | Freq: Two times a day (BID) | ORAL | 0 refills | Status: AC | PRN
Start: 2023-10-03 — End: ?

## 2023-10-11 ENCOUNTER — Encounter: Payer: Self-pay | Admitting: Family Medicine

## 2023-10-11 MED ORDER — DOXYCYCLINE HYCLATE 100 MG PO CAPS: 100.0000 mg | ORAL_CAPSULE | Freq: Two times a day (BID) | ORAL | 0 refills | Status: DC

## 2023-10-11 NOTE — Addendum Note (Signed)
Addended by: Abbe Amsterdam C on: 10/11/2023 05:45 PM   Modules accepted: Orders

## 2023-10-14 ENCOUNTER — Encounter: Payer: Self-pay | Admitting: Family Medicine

## 2023-10-14 ENCOUNTER — Ambulatory Visit: Payer: BC Managed Care – PPO | Admitting: Family Medicine

## 2023-10-14 ENCOUNTER — Other Ambulatory Visit: Payer: Self-pay | Admitting: Family Medicine

## 2023-10-14 VITALS — BP 140/85 | HR 90 | Temp 98.4°F

## 2023-10-14 DIAGNOSIS — L02419 Cutaneous abscess of limb, unspecified: Secondary | ICD-10-CM

## 2023-10-14 MED ORDER — POVIDONE-IODINE 10 % EX SOLN
1.0000 | CUTANEOUS | Status: DC | PRN
Start: 2023-10-14 — End: 2023-10-14

## 2023-10-14 MED ORDER — POVIDONE-IODINE 10 % EX SWAB
1.0000 | CUTANEOUS | Status: DC | PRN
Start: 2023-10-14 — End: 2023-10-14

## 2023-10-14 NOTE — Patient Instructions (Signed)
We drained 2 pus collections under your right arm today.  Please leave the dressing in place until tomorrow, then you can shower as usual and pat the area dry.  Okay to keep a bandage over it until any oozing has stopped and it feels comfortable.  Continue taking the antibiotic until you finish it.  If you feel like warm compresses are helpful you can continue those as well.  Please let me know if this does not seem to be improving.  If any bleeding beyond using that you cannot get under control with pressure please seek care right away  As we discussed, it is possible that you may have hidradenitis suppurativa.  If this continues happening we will take further steps

## 2023-10-14 NOTE — Addendum Note (Signed)
Addended by: Abbe Amsterdam C on: 10/14/2023 10:34 PM   Modules accepted: Orders

## 2023-10-14 NOTE — Progress Notes (Signed)
[facility] [address], [address] [place], [place] [address] [phone]  Date:  10/14/2023   Name:  Patricia Morse   DOB:  October 01, 1986   MRN:  [id number]  PCP:  Pearline Cables, MD    Chief Complaint: No chief complaint on file.   History of Present Illness:  Patricia Morse is a 37 y.o. very pleasant female patient who presents with the following:  Patient seen today with recurrent abscess in her right axilla.  I saw her for this issue on 10/30 and did an I&D in the office-this particular area did well but she contacted me again over the weekend with some additional areas, I started doxycycline and we scheduled this appointment  Patient notes her axillary area is somewhat better but she has to painful, swollen spots that have persisted.  She otherwise has felt well, no fever or systemic symptoms  She wonders if she might have hidradenitis suppurativa, she is read about this diagnosis.  Advised her this is possible, but typically we would have seen symptoms before-this is the first time she has had issues with this type of abscess  Patient Active Problem List   Diagnosis Date Noted   Spasm eyelid 07/25/2023   Maternal anemia, with delivery 01/15/2022   Encounter for induction of labor 01/13/2022   Gestational hypertension 01/13/2022   SVD 2/18 01/13/2022   Postpartum care following vaginal delivery 2/18 01/13/2022   Second degree perineal laceration 01/13/2022   Rubella non-immune 01/13/2022   Depression with anxiety 03/17/2020   Hearing loss 10/10/2012    Past Medical History:  Diagnosis Date   Allergy    Auditory neuropathy BILATERAL   Bilateral sensorineural hearing loss    SECONDARY AUDITORY NEUROPATHY   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     Depression    History of encephalitis    unsure if it was encephalitis or guillian barre  no flu shots   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    RESIDUAL ABSENCE OF KNEE REFLEX   Internal derangement of right knee    PONV (postoperative nausea and vomiting) POSTOP COCHLEAR  IMPLANT 2006   Pregnancy induced hypertension    Seasonal allergies     Past Surgical History:  Procedure Laterality Date   BILATERAL MIDDLE EAR EXPLORATION/ SEALING OF OVAL AND ROUND WINDOW FISTULAS  1999 (AGE 41)   PERILYMPH FISTULAS   CARPAL TUNNEL RELEASE Right 04/09/2023   Procedure: RIGHT CARPAL TUNNEL RELEASE;  Surgeon: Betha Loa, MD;  Location: [facility];  Service: Orthopedics;  Laterality: Right;  Bier block   CHONDROPLASTY  03/13/2012   Procedure: CHONDROPLASTY;  Surgeon: Shelda Pal, MD;  Location: [facility];  Service: Orthopedics;  Laterality: Right;   COCHLEAR IMPLANT  11-15-2005  (AGE 55)   LEFT EAR  (CLARION HIGH-RESOLUTION 90K MULTICHANNEL,  1-J ELECTRODE)   COCHLEAR IMPLANT Right 11/12/2018   Procedure: COCHLEAR IMPLANT RIGHT EAR;  Surgeon: Ermalinda Barrios, MD;  Location: [facility];  Service: ENT;  Laterality: Right;   KNEE ARTHROSCOPY  03/13/2012   Procedure: ARTHROSCOPY KNEE;  Surgeon: Shelda Pal, MD;  Location: [facility];  Service: Orthopedics;  Laterality: Right;  RIGHT KNEE DIAGNOSTIC AND OPERATIVE SCOPE   WISDOM TOOTH EXTRACTION  AGE 89   DENTAL OFFICE    Social History   Tobacco Use   Smoking status: Former  Current packs/day: 0.00    Types: Cigarettes    Quit date: 03/10/2008    Years since quitting: 15.6   Smokeless tobacco: Never  Vaping Use   Vaping status: Never Used  Substance Use Topics   Alcohol use: Not Currently    Alcohol/week: 3.0 - 5.0 standard drinks of alcohol    Types: 3 - 5 Glasses of wine per week    Comment: OCCASIONAL, "social"    Drug use: No    Family History  Problem Relation Age of Onset   Arthritis Mother    Anxiety disorder Mother    Depression Mother    Hypertension Mother    Mental illness Mother    Hashimoto's  thyroiditis Mother    Hypertension Father    Hyperlipidemia Father    Anxiety disorder Maternal Grandmother    Colon cancer Maternal Grandfather     Allergies  Allergen Reactions   Amoxicillin Hives and Rash   Contrast Media [Iodinated Contrast Media] Itching    Swelling, high heart rate   Iodine Anaphylaxis and Itching    Swelling, high heart rate   Hydrocodone     Other Reaction(s): Flushing   Hydrocodeine [Dihydrocodeine] Rash   Penicillins Rash    Medication list has been reviewed and updated.  Current Outpatient Medications on File Prior to Visit  Medication Sig Dispense Refill   ALPRAZolam (XANAX) 0.25 MG tablet Take 1 tablet (0.25 mg total) by mouth 2 (two) times daily as needed for anxiety. 20 tablet 0   aspirin EC 81 MG tablet Take 81 mg by mouth daily. Swallow whole.     calcium carbonate (TUMS - DOSED IN MG ELEMENTAL CALCIUM) 500 MG chewable tablet Chew 1 tablet by mouth daily as needed for indigestion or heartburn.     cetirizine (ZYRTEC) 10 MG tablet Take 10 mg by mouth daily.     doxycycline (VIBRAMYCIN) 100 MG capsule Take 1 capsule (100 mg total) by mouth 2 (two) times daily. 20 capsule 0   DULoxetine (CYMBALTA) 30 MG capsule Take 1 capsule (30 mg total) by mouth daily. Take with 60 mg of cymbalta to equal 90 mg total (Patient taking differently: Take 60 mg by mouth daily. Take with 60 mg of cymbalta to equal 90 mg total) 30 capsule 0   Probiotic Product (PROBIOTIC DAILY PO) Take 1 tablet by mouth daily.     valACYclovir (VALTREX) 1000 MG tablet USE AS DIRECTED FOR SUPPRESSION 90 tablet 3   No current facility-administered medications on file prior to visit.    Review of Systems:  As per HPI- otherwise negative.   Physical Examination: Vitals:   10/14/23 1527  BP: (!) 140/85  Pulse: 90  Temp: 98.4 F (36.9 C)   There were no vitals filed for this visit. There is no height or weight on file to calculate BMI. Ideal Body Weight:    GEN: no acute  distress.  Obese, looks well HEENT: Atraumatic, Normocephalic.  Ears and Nose: No external deformity. CV: RRR, No M/G/R. No JVD. No thrill. No extra heart sounds. PULM: CTA B, no wheezes, crackles, rhonchi. No retractions. No resp. distress. No accessory muscle use. ABD: S, NT, ND EXTR: No c/c/e PSYCH: Normally interactive. Conversant.  Right axilla reveals 2 tender, fluctuant assesses Verbal consent obtained.  Area prepped with Betadine and alcohol.  Anesthesia with approximately 1.5 mL of 1% lidocaine with epi.  #11 blade used to incise and drain both small abscesses, pus expressed.  Bandaged with gauze and an  island dressing.  Patient tolerated well with no immediate complications, estimated blood loss less than 5 mL  Assessment and Plan: Abscess, axilla Patient seen today with recurrent axillary abscess: We drained 2 pus collections under your right arm today.  Please leave the dressing in place until tomorrow, then you can shower as usual and pat the area dry.  Okay to keep a bandage over it until any oozing has stopped and it feels comfortable.  Continue taking the antibiotic until you finish it.  If you feel like warm compresses are helpful you can continue those as well.  Please let me know if this does not seem to be improving.  If any bleeding beyond using that you cannot get under control with pressure please seek care right away  As we discussed, it is possible that you may have hidradenitis suppurativa.  If this continues happening we will take further steps  Signed Abbe Amsterdam, MD

## 2023-12-02 ENCOUNTER — Encounter: Payer: Self-pay | Admitting: Family Medicine

## 2023-12-02 DIAGNOSIS — L02419 Cutaneous abscess of limb, unspecified: Secondary | ICD-10-CM

## 2023-12-05 ENCOUNTER — Ambulatory Visit: Payer: BC Managed Care – PPO | Admitting: Family Medicine

## 2023-12-10 MED ORDER — DOXYCYCLINE HYCLATE 100 MG PO CAPS
100.0000 mg | ORAL_CAPSULE | Freq: Two times a day (BID) | ORAL | 0 refills | Status: DC
Start: 2023-12-10 — End: 2024-02-17

## 2023-12-16 ENCOUNTER — Encounter: Payer: Self-pay | Admitting: Family Medicine

## 2024-01-13 ENCOUNTER — Encounter: Payer: Self-pay | Admitting: Family Medicine

## 2024-01-13 MED ORDER — FLUCONAZOLE 150 MG PO TABS: 150.0000 mg | ORAL_TABLET | Freq: Once | ORAL | 0 refills | Status: AC

## 2024-02-04 ENCOUNTER — Encounter: Payer: Self-pay | Admitting: Family Medicine

## 2024-02-04 MED ORDER — VALACYCLOVIR HCL 1 G PO TABS: ORAL_TABLET | ORAL | 3 refills | Status: AC

## 2024-02-14 ENCOUNTER — Other Ambulatory Visit: Payer: Self-pay | Admitting: Orthopedic Surgery

## 2024-02-17 ENCOUNTER — Encounter (HOSPITAL_BASED_OUTPATIENT_CLINIC_OR_DEPARTMENT_OTHER): Payer: Self-pay | Admitting: Orthopedic Surgery

## 2024-02-17 ENCOUNTER — Other Ambulatory Visit: Payer: Self-pay

## 2024-02-21 ENCOUNTER — Encounter (HOSPITAL_BASED_OUTPATIENT_CLINIC_OR_DEPARTMENT_OTHER)
Admission: RE | Disposition: A | Discharge status: Home / Self Care | Payer: Self-pay | Source: Home / Self Care | Attending: Orthopedic Surgery

## 2024-02-21 ENCOUNTER — Other Ambulatory Visit: Payer: Self-pay

## 2024-02-21 ENCOUNTER — Ambulatory Visit (HOSPITAL_BASED_OUTPATIENT_CLINIC_OR_DEPARTMENT_OTHER): Admitting: Anesthesiology

## 2024-02-21 ENCOUNTER — Encounter (HOSPITAL_BASED_OUTPATIENT_CLINIC_OR_DEPARTMENT_OTHER): Payer: Self-pay | Admitting: Orthopedic Surgery

## 2024-02-21 ENCOUNTER — Ambulatory Visit (HOSPITAL_BASED_OUTPATIENT_CLINIC_OR_DEPARTMENT_OTHER)
Admission: RE | Admit: 2024-02-21 | Discharge: 2024-02-21 | Disposition: A | Discharge status: Home / Self Care | Attending: Orthopedic Surgery | Admitting: Orthopedic Surgery

## 2024-02-21 DIAGNOSIS — G5602 Carpal tunnel syndrome, left upper limb: Secondary | ICD-10-CM | POA: Diagnosis present

## 2024-02-21 DIAGNOSIS — Z87891 Personal history of nicotine dependence: Secondary | ICD-10-CM | POA: Insufficient documentation

## 2024-02-21 DIAGNOSIS — I10 Essential (primary) hypertension: Secondary | ICD-10-CM | POA: Diagnosis not present

## 2024-02-21 DIAGNOSIS — Z01818 Encounter for other preprocedural examination: Secondary | ICD-10-CM

## 2024-02-21 HISTORY — PX: CARPAL TUNNEL RELEASE: SHX101

## 2024-02-21 LAB — POCT PREGNANCY, URINE: Preg Test, Ur: NEGATIVE

## 2024-02-21 SURGERY — CARPAL TUNNEL RELEASE
Anesthesia: General | Site: Wrist | Laterality: Left

## 2024-02-21 MED ORDER — OXYCODONE HCL 5 MG/5ML PO SOLN: 5.0000 mg | Freq: Once | ORAL | Status: AC | PRN

## 2024-02-21 MED ORDER — 0.9 % SODIUM CHLORIDE (POUR BTL) OPTIME
TOPICAL | Status: DC | PRN
  Administered 2024-02-21: 100 mL

## 2024-02-21 MED ORDER — PROPOFOL 500 MG/50ML IV EMUL
INTRAVENOUS | Status: DC | PRN
  Administered 2024-02-21: 200 ug/kg/min via INTRAVENOUS

## 2024-02-21 MED ORDER — SCOPOLAMINE 1 MG/3DAYS TD PT72
1.0000 | MEDICATED_PATCH | TRANSDERMAL | Status: DC
Start: 2024-02-21 — End: 2024-02-21
  Administered 2024-02-21: 1.5 mg via TRANSDERMAL

## 2024-02-21 MED ORDER — MIDAZOLAM HCL 2 MG/2ML IJ SOLN
INTRAMUSCULAR | Status: DC | PRN
  Administered 2024-02-21: 2 mg via INTRAVENOUS

## 2024-02-21 MED ORDER — ONDANSETRON HCL 4 MG/2ML IJ SOLN
INTRAMUSCULAR | Status: DC | PRN
  Administered 2024-02-21: 4 mg via INTRAVENOUS

## 2024-02-21 MED ORDER — ACETAMINOPHEN 500 MG PO TABS
1000.0000 mg | ORAL_TABLET | Freq: Once | ORAL | Status: AC
  Administered 2024-02-21: 1000 mg via ORAL

## 2024-02-21 MED ORDER — PROPOFOL 1000 MG/100ML IV EMUL
INTRAVENOUS | Status: AC
  Filled 2024-02-21: qty 100

## 2024-02-21 MED ORDER — DEXMEDETOMIDINE HCL IN NACL 80 MCG/20ML IV SOLN
INTRAVENOUS | Status: DC | PRN
  Administered 2024-02-21: 4 ug via INTRAVENOUS

## 2024-02-21 MED ORDER — DEXAMETHASONE SODIUM PHOSPHATE 4 MG/ML IJ SOLN
INTRAMUSCULAR | Status: DC | PRN
  Administered 2024-02-21: 10 mg via INTRAVENOUS

## 2024-02-21 MED ORDER — ACETAMINOPHEN 500 MG PO TABS
ORAL_TABLET | ORAL | Status: AC
  Filled 2024-02-21: qty 2

## 2024-02-21 MED ORDER — BUPIVACAINE HCL (PF) 0.25 % IJ SOLN
INTRAMUSCULAR | Status: AC
  Filled 2024-02-21: qty 30

## 2024-02-21 MED ORDER — FENTANYL CITRATE (PF) 100 MCG/2ML IJ SOLN
INTRAMUSCULAR | Status: DC | PRN
  Administered 2024-02-21: 25 ug via INTRAVENOUS
  Administered 2024-02-21: 50 ug via INTRAVENOUS
  Administered 2024-02-21: 25 ug via INTRAVENOUS

## 2024-02-21 MED ORDER — PROPOFOL 500 MG/50ML IV EMUL
INTRAVENOUS | Status: AC
  Filled 2024-02-21: qty 50

## 2024-02-21 MED ORDER — SCOPOLAMINE 1 MG/3DAYS TD PT72
MEDICATED_PATCH | TRANSDERMAL | Status: AC
  Filled 2024-02-21: qty 1

## 2024-02-21 MED ORDER — PROPOFOL 10 MG/ML IV BOLUS
INTRAVENOUS | Status: DC | PRN
  Administered 2024-02-21: 50 mg via INTRAVENOUS
  Administered 2024-02-21: 200 mg via INTRAVENOUS

## 2024-02-21 MED ORDER — OXYCODONE HCL 5 MG PO TABS
5.0000 mg | ORAL_TABLET | Freq: Once | ORAL | Status: AC | PRN
  Administered 2024-02-21: 5 mg via ORAL

## 2024-02-21 MED ORDER — FENTANYL CITRATE (PF) 100 MCG/2ML IJ SOLN
INTRAMUSCULAR | Status: AC
Start: 2024-02-21 — End: ?
  Filled 2024-02-21: qty 2

## 2024-02-21 MED ORDER — MIDAZOLAM HCL 2 MG/2ML IJ SOLN
INTRAMUSCULAR | Status: AC
  Filled 2024-02-21: qty 2

## 2024-02-21 MED ORDER — CELECOXIB 200 MG PO CAPS
ORAL_CAPSULE | ORAL | Status: AC
  Filled 2024-02-21: qty 1

## 2024-02-21 MED ORDER — LACTATED RINGERS IV SOLN: INTRAVENOUS | Status: DC

## 2024-02-21 MED ORDER — CELECOXIB 200 MG PO CAPS
200.0000 mg | ORAL_CAPSULE | Freq: Once | ORAL | Status: AC
  Administered 2024-02-21: 200 mg via ORAL

## 2024-02-21 MED ORDER — BUPIVACAINE HCL (PF) 0.25 % IJ SOLN
INTRAMUSCULAR | Status: DC | PRN
  Administered 2024-02-21: 9 mL

## 2024-02-21 MED ORDER — OXYCODONE HCL 5 MG PO TABS
ORAL_TABLET | ORAL | Status: AC
Start: 2024-02-21 — End: ?
  Filled 2024-02-21: qty 1

## 2024-02-21 MED ORDER — TRAMADOL HCL 50 MG PO TABS
50.0000 mg | ORAL_TABLET | Freq: Four times a day (QID) | ORAL | 0 refills | Status: AC | PRN
Start: 2024-02-21 — End: ?

## 2024-02-21 MED ORDER — ACETAMINOPHEN 500 MG PO TABS: 1000.0000 mg | ORAL_TABLET | Freq: Once | ORAL | Status: AC

## 2024-02-21 MED ORDER — DROPERIDOL 2.5 MG/ML IJ SOLN: 0.6250 mg | Freq: Once | INTRAMUSCULAR | Status: DC | PRN

## 2024-02-21 MED ORDER — FENTANYL CITRATE (PF) 100 MCG/2ML IJ SOLN: 25.0000 ug | INTRAMUSCULAR | Status: DC | PRN

## 2024-02-21 MED ORDER — LIDOCAINE HCL (CARDIAC) PF 100 MG/5ML IV SOSY
PREFILLED_SYRINGE | INTRAVENOUS | Status: DC | PRN
  Administered 2024-02-21: 60 mg via INTRAVENOUS

## 2024-02-21 MED ORDER — MEPERIDINE HCL 25 MG/ML IJ SOLN: 6.2500 mg | INTRAMUSCULAR | Status: DC | PRN

## 2024-02-21 SURGICAL SUPPLY — 29 items
BLADE SURG 15 STRL LF DISP TIS (BLADE) ×4 IMPLANT
BNDG ELASTIC 3INX 5YD STR LF (GAUZE/BANDAGES/DRESSINGS) ×2 IMPLANT
BNDG ESMARK 4X9 LF (GAUZE/BANDAGES/DRESSINGS) IMPLANT
BNDG GAUZE DERMACEA FLUFF 4 (GAUZE/BANDAGES/DRESSINGS) ×2 IMPLANT
CHLORAPREP W/TINT 26 (MISCELLANEOUS) ×2 IMPLANT
CORD BIPOLAR FORCEPS 12FT (ELECTRODE) ×2 IMPLANT
COVER BACK TABLE 60X90IN (DRAPES) ×2 IMPLANT
COVER MAYO STAND STRL (DRAPES) ×2 IMPLANT
CUFF TOURN SGL QUICK 18X4 (TOURNIQUET CUFF) ×2 IMPLANT
DRAPE EXTREMITY T 121X128X90 (DISPOSABLE) ×2 IMPLANT
DRAPE SURG 17X23 STRL (DRAPES) ×2 IMPLANT
GAUZE PAD ABD 8X10 STRL (GAUZE/BANDAGES/DRESSINGS) ×2 IMPLANT
GAUZE SPONGE 4X4 12PLY STRL (GAUZE/BANDAGES/DRESSINGS) ×2 IMPLANT
GAUZE XEROFORM 1X8 LF (GAUZE/BANDAGES/DRESSINGS) ×2 IMPLANT
GLOVE BIO SURGEON STRL SZ7.5 (GLOVE) ×2 IMPLANT
GLOVE BIOGEL PI IND STRL 8 (GLOVE) ×2 IMPLANT
GOWN STRL REUS W/ TWL LRG LVL3 (GOWN DISPOSABLE) ×2 IMPLANT
GOWN STRL REUS W/TWL XL LVL3 (GOWN DISPOSABLE) ×2 IMPLANT
NDL HYPO 25X1 1.5 SAFETY (NEEDLE) ×2 IMPLANT
NEEDLE HYPO 25X1 1.5 SAFETY (NEEDLE) ×1 IMPLANT
NS IRRIG 1000ML POUR BTL (IV SOLUTION) ×2 IMPLANT
PACK BASIN DAY SURGERY FS (CUSTOM PROCEDURE TRAY) ×2 IMPLANT
PADDING CAST ABS COTTON 4X4 ST (CAST SUPPLIES) ×2 IMPLANT
STOCKINETTE 4X48 STRL (DRAPES) ×2 IMPLANT
SUT ETHILON 4 0 PS 2 18 (SUTURE) ×2 IMPLANT
SYR BULB EAR ULCER 3OZ GRN STR (SYRINGE) ×2 IMPLANT
SYR CONTROL 10ML LL (SYRINGE) ×2 IMPLANT
TOWEL GREEN STERILE FF (TOWEL DISPOSABLE) ×4 IMPLANT
UNDERPAD 30X36 HEAVY ABSORB (UNDERPADS AND DIAPERS) ×2 IMPLANT

## 2024-02-21 NOTE — H&P (Signed)
 Patricia Morse is an 38 y.o. female.   Chief Complaint: carpal tunnel syndrome HPI: 38 y.o. yo female with numbness and tingling left hand.  Nocturnal symptoms. Positive nerve conduction studies. She wishes to have left carpal tunnel release.   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     Depression    History of encephalitis    unsure if it was encephalitis or guillian barre  no flu shots   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   RESIDUAL ABSENCE OF KNEE REFLEX   Internal derangement of right knee    PONV (postoperative nausea and vomiting) POSTOP COCHLEAR  IMPLANT 2006   Pregnancy induced hypertension    Seasonal allergies     Past Surgical History:  Procedure Laterality Date   BILATERAL MIDDLE EAR EXPLORATION/ SEALING OF OVAL AND ROUND WINDOW FISTULAS  1999 (AGE 71)   PERILYMPH FISTULAS   CARPAL TUNNEL RELEASE Right 04/09/2023   Procedure: RIGHT CARPAL TUNNEL RELEASE;  Surgeon: Betha Loa, MD;  Location: [facility];  Service: Orthopedics;  Laterality: Right;  Bier block   CHONDROPLASTY  03/13/2012   Procedure: CHONDROPLASTY;  Surgeon: Shelda Pal, MD;  Location: [facility];  Service: Orthopedics;  Laterality: Right;   COCHLEAR IMPLANT  11-15-2005  (AGE 31)   LEFT EAR  (CLARION HIGH-RESOLUTION 90K MULTICHANNEL,  1-J ELECTRODE)   COCHLEAR IMPLANT Right 11/12/2018   Procedure: COCHLEAR IMPLANT RIGHT EAR;  Surgeon: Ermalinda Barrios, MD;  Location: [facility];  Service: ENT;  Laterality: Right;   KNEE ARTHROSCOPY  03/13/2012   Procedure: ARTHROSCOPY KNEE;  Surgeon: Shelda Pal, MD;  Location: [facility];  Service: Orthopedics;  Laterality: Right;  RIGHT KNEE DIAGNOSTIC AND OPERATIVE SCOPE   WISDOM TOOTH EXTRACTION  AGE 30   DENTAL OFFICE    Family History: Family History  Problem Relation Age of Onset   Arthritis Mother    Anxiety disorder Mother    Depression Mother    Hypertension Mother    Mental illness Mother    Hashimoto's thyroiditis Mother    Hypertension Father    Hyperlipidemia Father    Anxiety disorder Maternal Grandmother    Colon cancer Maternal Grandfather     Social History:   reports that she quit smoking about 15 years ago. Her smoking use included cigarettes. She has never used smokeless tobacco. She reports that she does not currently use alcohol after a past usage of about 3.0 - 5.0 standard drinks of alcohol per week. She reports that she does not use drugs.  Medications: Medications Prior to Admission  Medication Sig Dispense Refill   ALPRAZolam (XANAX) 0.25 MG tablet Take 1 tablet (0.25 mg total) by mouth 2 (two) times daily as needed for anxiety. 20 tablet 0   cetirizine (ZYRTEC) 10 MG tablet Take 10 mg by mouth daily.     DULoxetine (CYMBALTA) 30 MG capsule Take 1 capsule (30 mg  total) by mouth daily. Take with 60 mg of cymbalta to equal 90 mg total (Patient taking differently: Take 60 mg by mouth daily. Take with 60 mg of cymbalta to equal 90 mg total) 30 capsule 0   fluticasone (FLONASE) 50 MCG/ACT nasal spray Place into both nostrils daily.     Probiotic Product (PROBIOTIC DAILY PO) Take 1 tablet by mouth daily.     valACYclovir (VALTREX) 1000 MG tablet Use as directed for suppression 90 tablet 3   calcium carbonate (TUMS - DOSED IN MG ELEMENTAL CALCIUM) 500 MG chewable tablet Chew 1 tablet by mouth daily as needed for indigestion or heartburn.      Results for orders  placed or performed during the hospital encounter of 02/21/24 (from the past 48 hours)  Pregnancy, urine POC     Status: None   Collection Time: 02/21/24 11:44 AM  Result Value Ref Range   Preg Test, Ur NEGATIVE NEGATIVE    Comment:        THE SENSITIVITY OF THIS METHODOLOGY IS >24 mIU/mL     No results found.    Blood pressure 125/88, pulse 90, temperature 97.8 F (36.6 C), temperature source Tympanic, resp. rate 18, height 5\' 5"  (1.651 m), weight 97.7 kg, last menstrual period 02/11/2024, SpO2 100%.  General appearance: alert, cooperative, and appears stated age Head: Normocephalic, without obvious abnormality, atraumatic Neck: supple, symmetrical, trachea midline Extremities: Intact sensation and capillary refill all digits.  +epl/fpl/io.  No wounds.  Skin: Skin color, texture, turgor normal. No rashes or lesions Neurologic: Grossly normal Incision/Wound: none  Assessment/Plan Left carpal tunnel syndrome.  Non operative and operative treatment options have been discussed with the patient and patient wishes to proceed with operative treatment. Risks, benefits, and alternatives of surgery have been discussed and the patient agrees with the plan of care.   Betha Loa 02/21/2024, 2:12 PM

## 2024-02-21 NOTE — Anesthesia Preprocedure Evaluation (Addendum)
 Anesthesia Evaluation  Patient identified by MRN, date of birth, ID band Patient awake    Reviewed: Allergy & Precautions, NPO status , Patient's Chart, lab work & pertinent test results  History of Anesthesia Complications (+) PONV and history of anesthetic complications  Airway Mallampati: II  TM Distance: >3 FB Neck ROM: Full    Dental no notable dental hx. (+) Dental Advisory Given, Teeth Intact   Pulmonary former smoker   Pulmonary exam normal breath sounds clear to auscultation       Cardiovascular hypertension, Normal cardiovascular exam Rhythm:Regular Rate:Normal     Neuro/Psych  PSYCHIATRIC DISORDERS Anxiety Depression       GI/Hepatic negative GI ROS, Neg liver ROS,,,  Endo/Other  negative endocrine ROS    Renal/GU negative Renal ROS     Musculoskeletal negative musculoskeletal ROS (+)    Abdominal  (+) + obese  Peds  Hematology  (+) Blood dyscrasia, anemia   Anesthesia Other Findings   Reproductive/Obstetrics                             Anesthesia Physical Anesthesia Plan  ASA: 2  Anesthesia Plan: General   Post-op Pain Management: Tylenol PO (pre-op)* and Celebrex PO (pre-op)*   Induction:   PONV Risk Score and Plan: 4 or greater and Ondansetron, Dexamethasone, Propofol infusion, TIVA, Treatment may vary due to age or medical condition and Midazolam  Airway Management Planned: LMA  Additional Equipment:   Intra-op Plan:   Post-operative Plan: Extubation in OR  Informed Consent: I have reviewed the patients History and Physical, chart, labs and discussed the procedure including the risks, benefits and alternatives for the proposed anesthesia with the patient or authorized representative who has indicated his/her understanding and acceptance.     Dental advisory given  Plan Discussed with: CRNA  Anesthesia Plan Comments:        Anesthesia Quick  Evaluation

## 2024-02-21 NOTE — Anesthesia Procedure Notes (Signed)
 Procedure Name: LMA Insertion Date/Time: 02/21/2024 2:26 PM  Performed by: Earmon Phoenix, CRNAPre-anesthesia Checklist: Patient identified, Emergency Drugs available, Suction available, Patient being monitored and Timeout performed Patient Re-evaluated:Patient Re-evaluated prior to induction Oxygen Delivery Method: Circle system utilized Preoxygenation: Pre-oxygenation with 100% oxygen Induction Type: IV induction Ventilation: Mask ventilation without difficulty LMA: LMA inserted LMA Size: 4.0 Number of attempts: 1 Placement Confirmation: positive ETCO2 and breath sounds checked- equal and bilateral Tube secured with: Tape Dental Injury: Teeth and Oropharynx as per pre-operative assessment

## 2024-02-21 NOTE — Transfer of Care (Signed)
 Immediate Anesthesia Transfer of Care Note  Patient: AIMEE HELDMAN  Procedure(s) Performed: CARPAL TUNNEL RELEASE (Left: Wrist)  Patient Location: PACU  Anesthesia Type:General  Level of Consciousness: awake and patient cooperative  Airway & Oxygen Therapy: Patient Spontanous Breathing and Patient connected to face mask oxygen  Post-op Assessment: Report given to RN and Post -op Vital signs reviewed and stable  Post vital signs: Reviewed and stable  Last Vitals:  Vitals Value Taken Time  BP 139/97 02/21/24 1504  Temp    Pulse 99 02/21/24 1509  Resp 17 02/21/24 1509  SpO2 99 % 02/21/24 1509  Vitals shown include unfiled device data.  Last Pain:  Vitals:   02/21/24 1203  TempSrc: Tympanic  PainSc: 0-No pain      Patients Stated Pain Goal: 6 (02/21/24 1203)  Complications: No notable events documented.

## 2024-02-21 NOTE — Discharge Instructions (Addendum)
 [facility] Instructions Hand Surgery  Wound Care: Keep your hand elevated above the level of your heart.  Do not allow it to dangle by your side.  Keep the dressing dry and do not remove it unless your doctor advises you to do so.  He will usually change it at the time of your post-op visit.  Moving your fingers is advised to stimulate circulation but will depend on the site of your surgery.  If you have a splint applied, your doctor will advise you regarding movement.  Activity: Do not drive or operate machinery today.  Rest today and then you may return to your normal activity and work as indicated by your physician.  Diet:  Drink liquids today or eat a light diet.  You may resume a regular diet tomorrow.    General expectations: Pain for two to three days. Fingers may become slightly swollen.  Call your doctor if any of the following occur: Severe pain not relieved by pain medication. Elevated temperature. Dressing soaked with blood. Inability to move fingers. White or bluish color to fingers.    Post Anesthesia Home Care Instructions  Activity: Get plenty of rest for the remainder of the day. A responsible individual must stay with you for 24 hours following the procedure.  For the next 24 hours, DO NOT: -Drive a car -Advertising copywriter -Drink alcoholic beverages -Take any medication unless instructed by your physician -Make any legal decisions or sign important papers.  Meals: Start with liquid foods such as gelatin or soup. Progress to regular foods as tolerated. Avoid greasy, spicy, heavy foods. If nausea and/or vomiting occur, drink only clear liquids until the nausea and/or vomiting subsides. Call your physician if vomiting continues.  Special Instructions/Symptoms: Your throat may feel dry or sore from the anesthesia or the breathing tube placed in your throat during surgery. If this causes discomfort, gargle with warm salt water. The discomfort should disappear  within 24 hours.  If you had a scopolamine patch placed behind your ear for the management of post- operative nausea and/or vomiting:  1. The medication in the patch is effective for 72 hours, after which it should be removed.  Wrap patch in a tissue and discard in the trash. Wash hands thoroughly with soap and water. 2. You may remove the patch earlier than 72 hours if you experience unpleasant side effects which may include dry mouth, dizziness or visual disturbances. 3. Avoid touching the patch. Wash your hands with soap and water after contact with the patch.    Next dose of tylenol or ibuprofen is at 6:10pm

## 2024-02-21 NOTE — Op Note (Signed)
 02/21/2024 [facility]                              OPERATIVE REPORT   PREOPERATIVE DIAGNOSIS:  Left carpal tunnel syndrome  POSTOPERATIVE DIAGNOSIS:  Left carpal tunnel syndrome  PROCEDURE:  Left carpal tunnel release  SURGEON:  Betha Loa, MD  ASSISTANT:  none.  ANESTHESIA: General  IV FLUIDS:  Per anesthesia flow sheet  ESTIMATED BLOOD LOSS:  Minimal  COMPLICATIONS:  None  SPECIMENS:  None  TOURNIQUET TIME:    Total Tourniquet Time Documented: Upper Arm (Left) - 13 minutes Total: Upper Arm (Left) - 13 minutes   DISPOSITION:  Stable to PACU  LOCATION: [facility]  INDICATIONS:  38 y.o. yo female with numbness and tingling left hand.  Nocturnal symptoms. Positive nerve conduction studies. She wishes to proceed with left carpal tunnel release.  Risks, benefits and alternatives of surgery were discussed including the risk of blood loss; infection; damage to nerves, vessels, tendons, ligaments, bone; failure of surgery; need for additional surgery; complications with wound healing; continued pain; recurrence of carpal tunnel syndrome; and damage to motor branch. She voiced understanding of these risks and elected to proceed.   OPERATIVE COURSE:  After being identified preoperatively by myself, the patient and I agreed upon the procedure and site of procedure.  The surgical site was marked.  Surgical consent had been signed.  Antibiotics held for this soft tissue procedure.  She was transferred to the operating room and placed on the operating room table in supine position with the Left upper extremity on an armboard.  General anesthesia was induced by the anesthesiologist.  Left upper extremity was prepped and draped in normal sterile orthopaedic fashion.  A surgical pause was performed between the surgeons, anesthesia, and operating room staff, and all were in agreement as to the patient, procedure, and site of procedure.  Tourniquet at the proximal  aspect of the extremity was inflated to 250 mmHg after exsanguination of the arm with an Esmarch bandage  Incision was made over the transverse carpal ligament and carried into the subcutaneous tissues by spreading technique.  Bipolar electrocautery was used to obtain hemostasis.  The palmar fascia was sharply incised.  The transverse carpal ligament was identified.  The fascia distal to the ligament was opened.  Retractor was placed and the flexor tendons were identified.  The flexor tendon to the ring finger was identified and retracted radially.  The transverse carpal ligament was then incised from distal to proximal under direct visualization.  Scissors were used to split the distal aspect of the volar antebrachial fascia.  A finger was placed into the wound to ensure complete decompression, which was the case.  The nerve was examined.  It was flattened and hyperemic.  The motor branch was identified and was intact.  The wound was copiously irrigated with sterile saline.  It was then closed with 4-0 nylon in a horizontal mattress fashion.  It was injected with 0.25% plain Marcaine to aid in postoperative analgesia.  It was dressed with sterile Xeroform, 4x4s, an ABD, and wrapped with Kerlix and an Ace bandage.  Tourniquet was deflated at 13 minutes.  Fingertips were pink with brisk capillary refill after deflation of the tourniquet.  Operative drapes were broken down.  The patient was awoken from anesthesia safely.  She was transferred back to stretcher and taken to the PACU in stable condition.  I will  see her back in the office in 1 week for postoperative followup.  I will give her a prescription for Tramadol 50 mg 1 tab PO q6 hours prn pain, dispense # 20.    Betha Loa, MD Electronically signed, 02/21/24

## 2024-02-22 NOTE — Anesthesia Postprocedure Evaluation (Signed)
 Anesthesia Post Note  Patient: Patricia Morse  Procedure(s) Performed: CARPAL TUNNEL RELEASE (Left: Wrist)     Patient location during evaluation: PACU Anesthesia Type: General Level of consciousness: sedated and patient cooperative Pain management: pain level controlled Vital Signs Assessment: post-procedure vital signs reviewed and stable Respiratory status: spontaneous breathing Cardiovascular status: stable Anesthetic complications: no   No notable events documented.  Last Vitals:  Vitals:   02/21/24 1530 02/21/24 1534  BP:  (!) 134/91  Pulse: 92 81  Resp: (!) 25 16  Temp:  (!) 36.3 C  SpO2: 95% 94%    Last Pain:  Vitals:   02/21/24 1543  TempSrc:   PainSc: 4                  [name]

## 2024-02-23 ENCOUNTER — Encounter (HOSPITAL_BASED_OUTPATIENT_CLINIC_OR_DEPARTMENT_OTHER): Payer: Self-pay | Admitting: Orthopedic Surgery

## 2024-04-06 ENCOUNTER — Other Ambulatory Visit: Payer: Self-pay

## 2024-04-06 ENCOUNTER — Ambulatory Visit
Admission: RE | Admit: 2024-04-06 | Discharge: 2024-04-06 | Disposition: A | Discharge status: Home / Self Care | Source: Ambulatory Visit | Attending: Family Medicine | Admitting: Family Medicine

## 2024-04-06 ENCOUNTER — Ambulatory Visit (INDEPENDENT_AMBULATORY_CARE_PROVIDER_SITE_OTHER)

## 2024-04-06 VITALS — BP 135/88 | HR 107 | Temp 98.4°F | Resp 17

## 2024-04-06 DIAGNOSIS — M25571 Pain in right ankle and joints of right foot: Secondary | ICD-10-CM | POA: Diagnosis not present

## 2024-04-06 DIAGNOSIS — S93401A Sprain of unspecified ligament of right ankle, initial encounter: Secondary | ICD-10-CM

## 2024-04-06 NOTE — ED Provider Notes (Signed)
 [facility]    CSN: [id number] Arrival date & time: 04/06/24  1001      History   Chief Complaint Chief Complaint  Patient presents with   Ankle Pain    I have sprained my Right ankle. I've sprained it before. It happened Saturday around 6pm. Pretty sure not broken. - Entered by patient    HPI Patricia Morse is a 38 y.o. female presents for ankle pain.  Patient reports 2 days ago while at a park she stepped in a hole rolling her right ankle.  States she heard a pop.  Since then she has been having pain and swelling to the lateral ankle.  She is able to bear weight with pain.  Reports history of sprains to the ankle in the past but no surgeries or fractures.  She has been using RICE therapy for treatment.  No other concerns at this time.   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   RESIDUAL ABSENCE OF KNEE REFLEX   Internal derangement of right knee    PONV (postoperative nausea and vomiting) POSTOP COCHLEAR  IMPLANT 2006   Pregnancy induced hypertension    Seasonal allergies     Patient Active Problem List   Diagnosis Date Noted   Spasm eyelid 07/25/2023   Maternal anemia, with delivery 01/15/2022   Encounter for induction of labor 01/13/2022   Gestational hypertension 01/13/2022   SVD 2/18 01/13/2022   Postpartum care following vaginal delivery 2/18 01/13/2022   Second degree perineal laceration 01/13/2022   Rubella non-immune 01/13/2022   Depression with anxiety 03/17/2020   Hearing loss 10/10/2012    Past Surgical History:  Procedure Laterality Date   BILATERAL MIDDLE EAR EXPLORATION/ SEALING OF OVAL AND ROUND WINDOW  FISTULAS  1999 (AGE 23)   PERILYMPH FISTULAS   CARPAL TUNNEL RELEASE Right 04/09/2023   Procedure: RIGHT CARPAL TUNNEL RELEASE;  Surgeon: Brunilda Capra, MD;  Location: [facility];  Service: Orthopedics;  Laterality: Right;  Bier block   CARPAL TUNNEL RELEASE Left 02/21/2024   Procedure: CARPAL TUNNEL RELEASE;  Surgeon: Brunilda Capra, MD;  Location: [facility];  Service: Orthopedics;  Laterality: Left;  30 MIN   CHONDROPLASTY  03/13/2012   Procedure: CHONDROPLASTY;  Surgeon: Bevin Bucks, MD;  Location: [facility];  Service: Orthopedics;  Laterality: Right;   COCHLEAR IMPLANT  11-15-2005  (AGE 81)   LEFT EAR  (CLARION HIGH-RESOLUTION 90K MULTICHANNEL,  1-J ELECTRODE)   COCHLEAR IMPLANT Right 11/12/2018   Procedure: COCHLEAR IMPLANT RIGHT EAR;  Surgeon: Ryland Cozier, MD;  Location: [facility];  Service: ENT;  Laterality: Right;   KNEE ARTHROSCOPY  03/13/2012   Procedure: ARTHROSCOPY KNEE;  Surgeon: Bevin Bucks, MD;  Location: [facility];  Service: Orthopedics;  Laterality: Right;  RIGHT KNEE DIAGNOSTIC AND OPERATIVE SCOPE   WISDOM TOOTH EXTRACTION  AGE 25   DENTAL OFFICE    OB History     Gravida  1   Para  1   Term  1   Preterm  AB      Living  1      SAB      IAB      Ectopic      Multiple  0   Live Births  1            Home Medications    Prior to Admission medications   Medication Sig Start Date End Date Taking? Authorizing Provider  ALPRAZolam  (XANAX ) 0.25 MG tablet Take 1 tablet (0.25 mg total) by mouth 2 (two) times daily as needed for anxiety. 10/03/23   Copland, Jessica C, MD  calcium carbonate (TUMS - DOSED IN MG ELEMENTAL CALCIUM) 500 MG chewable tablet Chew 1 tablet by mouth daily as needed for indigestion or heartburn.    [provider]  cetirizine (ZYRTEC) 10 MG tablet Take 10 mg by mouth daily.    [provider]  DULoxetine  (CYMBALTA ) 30 MG capsule  Take 1 capsule (30 mg total) by mouth daily. Take with 60 mg of cymbalta  to equal 90 mg total Patient taking differently: Take 60 mg by mouth daily. Take with 60 mg of cymbalta  to equal 90 mg total 03/18/23   Copland, Skipper Dumas, MD  fluticasone  (FLONASE ) 50 MCG/ACT nasal spray Place into both nostrils daily.    [provider]  Probiotic Product (PROBIOTIC DAILY PO) Take 1 tablet by mouth daily.    [provider]  traMADol  (ULTRAM ) 50 MG tablet Take 1 tablet (50 mg total) by mouth every 6 (six) hours as needed. 02/21/24   Brunilda Capra, MD  valACYclovir  (VALTREX ) 1000 MG tablet Use as directed for suppression 02/04/24   Copland, Skipper Dumas, MD    Family History Family History  Problem Relation Age of Onset   Arthritis Mother    Anxiety disorder Mother    Depression Mother    Hypertension Mother    Mental illness Mother    Hashimoto's thyroiditis Mother    Hypertension Father    Hyperlipidemia Father    Anxiety disorder Maternal Grandmother    Colon cancer Maternal Grandfather     Social History Social History   Tobacco Use   Smoking status: Former    Current packs/day: 0.00    Types: Cigarettes    Quit date: 03/10/2008    Years since quitting: 16.0   Smokeless tobacco: Never  Vaping Use   Vaping status: Never Used  Substance Use Topics   Alcohol use: Not Currently    Alcohol/week: 3.0 - 5.0 standard drinks of alcohol    Types: 3 - 5 Glasses of wine per week    Comment: OCCASIONAL, "social"    Drug use: No     Allergies   Amoxicillin, Contrast media [iodinated contrast media], Iodine , Hydrocodone , Hydrocodeine [dihydrocodeine], and Penicillins   Review of Systems Review of Systems  Musculoskeletal:        Right ankle pain     Physical Exam Triage Vital Signs ED Triage Vitals  Encounter Vitals Group     BP 04/06/24 1027 135/88     Systolic BP Percentile --      Diastolic BP Percentile --      Pulse Rate 04/06/24 1027 (!) 107     Resp 04/06/24  1027 17     Temp 04/06/24 1027 98.4 F (36.9 C)     Temp Source 04/06/24 1027 Oral     SpO2 04/06/24 1027 96 %     Weight --      Height --  Head Circumference --      Peak Flow --      Pain Score 04/06/24 1024 5     Pain Loc --      Pain Education --      Exclude from Growth Chart --    No data found.  Updated Vital Signs BP 135/88   Pulse (!) 107   Temp 98.4 F (36.9 C) (Oral)   Resp 17   LMP 03/09/2024   SpO2 96%   Visual Acuity Right Eye Distance:   Left Eye Distance:   Bilateral Distance:    Right Eye Near:   Left Eye Near:    Bilateral Near:     Physical Exam Vitals and nursing note reviewed.  Constitutional:      General: She is not in acute distress.    Appearance: Normal appearance. She is not ill-appearing.  HENT:     Head: Normocephalic and atraumatic.  Eyes:     Pupils: Pupils are equal, round, and reactive to light.  Cardiovascular:     Rate and Rhythm: Normal rate.  Pulmonary:     Effort: Pulmonary effort is normal.  Musculoskeletal:     Right ankle: Swelling and ecchymosis present. No deformity or lacerations. Tenderness present over the lateral malleolus. No medial malleolus, base of 5th metatarsal or proximal fibula tenderness. Decreased range of motion. Normal pulse.     Comments: Pain with dorsi flexion of the foot.  Skin:    General: Skin is warm and dry.  Neurological:     General: No focal deficit present.     Mental Status: She is alert and oriented to person, place, and time.  Psychiatric:        Mood and Affect: Mood normal.        Behavior: Behavior normal.      UC Treatments / Results  Labs (all labs ordered are listed, but only abnormal results are displayed) Labs Reviewed - No data to display  EKG   Radiology No results found.  Procedures Procedures (including critical care time)  Medications Ordered in UC Medications - No data to display  Initial Impression / Assessment and Plan / UC Course  I have  reviewed the triage vital signs and the nursing notes.  Pertinent labs & imaging results that were available during my care of the patient were reviewed by me and considered in my medical decision making (see chart for details).     Reviewed exam and symptoms with patient.  No red flags.  Wet read of x-ray without obvious abnormality, will contact for any positive results based on radiology overread once available.  Discussed ankle sprain and RICE therapy.  Patient declined Aircast stating she has a brace at home and currently has her own Ace wrap.  Advised OTC analgesics as needed.  PCP follow-up 1 week for recheck.  ER precautions reviewed and patient verbalized understanding. Final Clinical Impressions(s) / UC Diagnoses   Final diagnoses:  Acute right ankle pain  Sprain of right ankle, unspecified ligament, initial encounter     Discharge Instructions      Continue use of your Ace wrap to help with swelling and support of the joint.  Elevate and ice as needed.  You may take Tylenol  or ibuprofen  over-the-counter as needed for pain.  Please follow-up with your PCP in 1 week if your symptoms do not improve.  Please go to the ER for any worsening symptoms.  Hope you feel better soon!   ED  Prescriptions   None    PDMP not reviewed this encounter.   Alleen Arbour, NP 04/06/24 [phone]

## 2024-04-06 NOTE — Discharge Instructions (Signed)
 Continue use of your Ace wrap to help with swelling and support of the joint.  Elevate and ice as needed.  You may take Tylenol  or ibuprofen  over-the-counter as needed for pain.  Please follow-up with your PCP in 1 week if your symptoms do not improve.  Please go to the ER for any worsening symptoms.  Hope you feel better soon!

## 2024-04-06 NOTE — ED Triage Notes (Signed)
 Pt states she stepped in a hole at the park on Saturday and rolled her right ankle and felt a "popp." Pt has 2+ swelling on outer part of right ankle/foot. Pt has ecchymotic area under swelling area. Pt limped to exam room

## 2024-07-17 NOTE — Patient Instructions (Addendum)
 It was great to see you today, I will be in touch with your labs- we will see how your lipids look Recommend flu shot this fall We will start you back on doxycycline  for 10 days for your abscess An IUD might be a good option for contraception with your blood pressure

## 2024-07-17 NOTE — Progress Notes (Addendum)
 [facility] [address], [address] [place], [place] [address] [phone]  Date:  07/20/2024   Name:  Patricia Morse   DOB:  Dec 22, 1985   MRN:  [id number]  PCP:  Watt Harlene BROCKS, MD    Chief Complaint: Annual Exam   History of Present Illness:  Patricia Morse is a 38 y.o. very pleasant female patient who presents with the following:  Patient seen today for physical exam.  I saw her most recently in November when she was dealing with a recurrent abscess in her axillary area. She has history of gestational hypertension, hearing loss, ADD dx in adulthood.  She is also being treated for possible hidradenitis suppurativa per dermatology She gave birth to her son Patricia Morse in February 2023  Pap smear- she will be seen next week by gynecology Labs can be updated ?  Family history of colon or breast cancer- GF with colon cancer in his 79s, no other history Alprazolam  as needed  Wt Readings from Last 3 Encounters:  07/20/24 213 lb (96.6 kg)  02/21/24 215 lb 6.2 oz (97.7 kg)  09/25/23 221 lb 12.8 oz (100.6 kg)   Discussed the use of AI scribe software for clinical note transcription with the patient, who gave verbal consent to proceed.  History of Present Illness [name] Onken is a 38 year old female with ADHD who presents with concerns about medication side effects and skin lesions.  She was diagnosed with ADHD as an adult over the past summer, although initially diagnosed at age 89. She discontinued medication at that time due to adverse effects. After the birth of her son, her symptoms increased, leading to a trial of Adderall, which improved her focus and productivity but resulted in depression and suicidal thoughts after about a month of use. She discontinued Adderall and is currently on Concerta at a dose of 27 mg, which she feels is ineffective and causes sleep disturbances. She has not taken it for the past two  days.  She has a history of skin lesions, initially presenting as a swollen lump under her arm in November of the previous year. Dermatology has not officially diagnosed her with hidradenitis suppurativa, but she has been treated with doxycycline  and clindamycin  gel. The lesions have recurred, with a recent large lesion under her arm that burst spontaneously, releasing pus and blood. She reports that stress and her menstrual cycle may be associated with flares of the condition. She is scheduled to follow up with dermatology tomorrow.  She has a history of high cholesterol, previously managed with Lipitor for three months, which she discontinued. Her cholesterol was last checked by endocrinology and was within normal limits. She is interested in having her cholesterol and glucose levels checked again. She has lost weight recently, which she attributes to her ADHD medication affecting her appetite.  Her family history is significant for colon cancer in her grandfather, who was diagnosed in his seventies. She is aware of her husband's family history of colon cancer, which has necessitated early screening for him.  She is currently using condoms for contraception and plans to discuss further options with her gynecologist next week.  She notes elevated blood pressure has been an issue-she may not be able use estrogen.  Her son, aged two and a half, is starting preschool soon, and she is trying to resume walking for exercise after a recent period of stress.     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   RESIDUAL ABSENCE OF KNEE REFLEX   Internal derangement of right knee    PONV (postoperative nausea and vomiting) POSTOP COCHLEAR  IMPLANT 2006   Pregnancy induced hypertension    Seasonal allergies     Past Surgical History:  Procedure Laterality Date   BILATERAL MIDDLE EAR EXPLORATION/ SEALING OF OVAL AND ROUND WINDOW FISTULAS  1999 (AGE 22)   PERILYMPH FISTULAS   CARPAL TUNNEL RELEASE Right 04/09/2023   Procedure: RIGHT CARPAL TUNNEL RELEASE;  Surgeon: Murrell Drivers, MD;  Location: [facility];  Service: Orthopedics;  Laterality: Right;  Bier block   CARPAL TUNNEL RELEASE Left 02/21/2024   Procedure: CARPAL TUNNEL RELEASE;  Surgeon: Murrell Drivers, MD;  Location: [facility];  Service: Orthopedics;  Laterality: Left;  30 MIN   CHONDROPLASTY  03/13/2012   Procedure: CHONDROPLASTY;  Surgeon: Donnice JONETTA Car, MD;  Location: [facility];  Service: Orthopedics;  Laterality: Right;   COCHLEAR IMPLANT  11-15-2005  (AGE 88)   LEFT EAR  (CLARION HIGH-RESOLUTION 90K MULTICHANNEL,  1-J ELECTRODE)   COCHLEAR IMPLANT Right 11/12/2018   Procedure: COCHLEAR IMPLANT RIGHT EAR;  Surgeon: Thaddeus Locus, MD;  Location: [facility];  Service: ENT;  Laterality: Right;   KNEE ARTHROSCOPY  03/13/2012   Procedure: ARTHROSCOPY KNEE;  Surgeon: Donnice JONETTA Car, MD;  Location: [facility];  Service: Orthopedics;  Laterality: Right;  RIGHT KNEE DIAGNOSTIC AND OPERATIVE SCOPE   WISDOM TOOTH EXTRACTION  AGE 27   DENTAL OFFICE    Social History   Tobacco Use   Smoking status: Former    Current packs/day: 0.00    Types: Cigarettes    Quit date: 03/10/2008    Years since quitting: 16.3   Smokeless tobacco: Never  Vaping Use   Vaping status: Never Used  Substance Use Topics   Alcohol use: Not Currently    Alcohol/week: 3.0 - 5.0 standard drinks of alcohol     Types: 3 - 5 Glasses of wine per week    Comment: OCCASIONAL, social    Drug use: No    Family History  Problem Relation Age of Onset   Arthritis Mother    Anxiety disorder Mother    Depression Mother    Hypertension Mother    Mental illness Mother    Hashimoto's thyroiditis Mother    Hypertension Father    Hyperlipidemia Father    Anxiety disorder Maternal Grandmother    Colon cancer Maternal Grandfather     Allergies  Allergen Reactions   Amoxicillin Hives and Rash   Contrast Media [Iodinated Contrast Media] Itching    Swelling, high heart rate   Iodine  Anaphylaxis and Itching    Swelling, high heart rate   Hydrocodone      Other Reaction(s): Flushing   Hydrocodeine [Dihydrocodeine] Rash   Penicillins Rash    Medication list has been reviewed  and updated.  Current Outpatient Medications on File Prior to Visit  Medication Sig Dispense Refill   ALPRAZolam  (XANAX ) 0.25 MG tablet Take 1 tablet (0.25 mg total) by mouth 2 (two) times daily as needed for anxiety. 20 tablet 0   cetirizine (ZYRTEC) 10 MG tablet Take 10 mg by mouth daily.     DULoxetine  (CYMBALTA ) 30 MG capsule Take 1 capsule (30 mg total) by mouth daily. Take with 60 mg of cymbalta  to equal 90 mg total (Patient taking differently: Take 60 mg by mouth daily. Take with 60 mg of cymbalta  to equal 90 mg total) 30 capsule 0   methylphenidate 27 MG PO CR tablet Take 27 mg by mouth every morning.     Probiotic Product (PROBIOTIC DAILY PO) Take 1 tablet by mouth daily.     valACYclovir  (VALTREX ) 1000 MG tablet Use as directed for suppression 90 tablet 3   calcium carbonate (TUMS - DOSED IN MG ELEMENTAL CALCIUM) 500 MG chewable tablet Chew 1 tablet by mouth daily as needed for indigestion or heartburn. (Patient not taking: Reported on 07/20/2024)     fluticasone  (FLONASE ) 50 MCG/ACT nasal spray Place into both nostrils daily. (Patient not taking: Reported on 07/20/2024)     traMADol  (ULTRAM ) 50 MG tablet Take 1 tablet  (50 mg total) by mouth every 6 (six) hours as needed. (Patient not taking: Reported on 07/20/2024) 20 tablet 0   No current facility-administered medications on file prior to visit.    Review of Systems:  As per HPI- otherwise negative. 115/85  Physical Examination: Vitals:   07/20/24 1034  BP: (!) 142/88  Pulse: 82  Temp: 98.4 F (36.9 C)  SpO2: 98%   Vitals:   07/20/24 1034  Weight: 213 lb (96.6 kg)  Height: 5' 5 (1.651 m)   Body mass index is 35.45 kg/m. Ideal Body Weight: Weight in (lb) to have BMI = 25: 149.9  GEN: no acute distress.  Obese, looks well.  Hard of hearing HEENT: Atraumatic, Normocephalic.  Ears and Nose: No external deformity. CV: RRR, No M/G/R. No JVD. No thrill. No extra heart sounds. PULM: CTA B, no wheezes, crackles, rhonchi. No retractions. No resp. distress. No accessory muscle use. ABD: S, NT, ND, +BS. No rebound. No HSM. EXTR: No c/c/e PSYCH: Normally interactive. Conversant.  Right axilla displays an already drained small abscess.  It is no longer fluctuant, not particularly tender  Assessment and Plan: Physical exam  Hearing loss, unspecified hearing loss type, unspecified laterality  Screening, lipid - Plan: Lipid panel  Screening for deficiency anemia - Plan: CBC  Thyroid  disorder screening - Plan: TSH  Screening for diabetes mellitus - Plan: Comprehensive metabolic panel with GFR, Hemoglobin A1c  Axillary abscess - Plan: doxycycline  (VIBRAMYCIN ) 100 MG capsule  Physical exam today-encouraged healthy diet and exercise routine Will plan further follow- up pending labs. Assessment and Plan Assessment & Plan Adult Wellness Visit Cholesterol management with Lipitor stopped due to pregnancy plans.  However right now she and her husband have put a second child on hold - Order blood work to assess cholesterol and glucose levels. - Encourage continuation of walking for exercise.  Recurrent axillary and upper limb abscesses and  suspected hidradenitis suppurativa Recurrent axillary abscesses with recent rupture. Dermatology treating as hidradenitis suppurativa. Previous doxycycline  and clindamycin  gel effective. Current episode treated with doxycycline  due to pain and size. Stress identified as potential trigger. - Prescribe doxycycline  100 mg orally twice daily for 10 days. - Follow up with dermatology  for further evaluation and management. - Discuss potential for longer-term treatment with dermatology if needed.  Attention-deficit hyperactivity disorder (ADHD) ADHD diagnosed in adulthood. Adderall effective but caused depression and suicidal thoughts. Current low-dose Concerta ineffective and causes sleep disturbances. Considering Strattera as a non-stimulant option. - Consider trial of Strattera for ADHD management.  Her ADHD is being treated by another provider  Depression associated with stimulant use Depression and suicidal ideation linked to Adderall use, leading to its discontinuation.  Contraceptive management Currently using condoms. Plans to discuss options with gynecologist, considering NuvaRing or IUD due to blood pressure concerns. Previous progesterone-only birth control noted. Discussed potential impact of blood pressure on contraceptive options. - Discuss contraception options with gynecologist, including NuvaRing and IUD.   Signed Harlene Schroeder, MD  Received labs 8/26, message to patient  Results for orders placed or performed in visit on 07/20/24  CBC   Collection Time: 07/20/24 11:05 AM  Result Value Ref Range   WBC 6.5 4.0 - 10.5 K/uL   RBC 4.76 3.87 - 5.11 Mil/uL   Platelets 276.0 150.0 - 400.0 K/uL   Hemoglobin 13.6 12.0 - 15.0 g/dL   HCT 59.4 63.9 - 53.9 %   MCV 85.0 78.0 - 100.0 fl   MCHC 33.5 30.0 - 36.0 g/dL   RDW 87.0 88.4 - 84.4 %  Comprehensive metabolic panel with GFR   Collection Time: 07/20/24 11:05 AM  Result Value Ref Range   Sodium 140 135 - 145 mEq/L   Potassium  4.3 3.5 - 5.1 mEq/L   Chloride 103 96 - 112 mEq/L   CO2 27 19 - 32 mEq/L   Glucose, Bld 85 70 - 99 mg/dL   BUN 11 6 - 23 mg/dL   Creatinine, Ser 9.29 0.40 - 1.20 mg/dL   Total Bilirubin 0.5 0.2 - 1.2 mg/dL   Alkaline Phosphatase 84 39 - 117 U/L   AST 18 0 - 37 U/L   ALT 16 0 - 35 U/L   Total Protein 7.1 6.0 - 8.3 g/dL   Albumin 4.2 3.5 - 5.2 g/dL   GFR 888.88 >39.99 mL/min   Calcium 8.9 8.4 - 10.5 mg/dL  Hemoglobin J8r   Collection Time: 07/20/24 11:05 AM  Result Value Ref Range   Hgb A1c MFr Bld 5.5 4.6 - 6.5 %  Lipid panel   Collection Time: 07/20/24 11:05 AM  Result Value Ref Range   Cholesterol 256 (H) 0 - 200 mg/dL   Triglycerides 850.9 0.0 - 149.0 mg/dL   HDL 49.89 >60.99 mg/dL   VLDL 70.1 0.0 - 59.9 mg/dL   LDL Cholesterol 823 (H) 0 - 99 mg/dL   Total CHOL/HDL Ratio 5    NonHDL 205.75   TSH   Collection Time: 07/20/24 11:05 AM  Result Value Ref Range   TSH 2.92 0.35 - 5.50 uIU/mL

## 2024-07-20 ENCOUNTER — Encounter: Payer: Self-pay | Admitting: Family Medicine

## 2024-07-20 ENCOUNTER — Ambulatory Visit (INDEPENDENT_AMBULATORY_CARE_PROVIDER_SITE_OTHER): Admitting: Family Medicine

## 2024-07-20 VITALS — BP 115/85 | HR 82 | Temp 98.4°F | Ht 65.0 in | Wt 213.0 lb

## 2024-07-20 DIAGNOSIS — F988 Other specified behavioral and emotional disorders with onset usually occurring in childhood and adolescence: Secondary | ICD-10-CM | POA: Insufficient documentation

## 2024-07-20 DIAGNOSIS — Z0000 Encounter for general adult medical examination without abnormal findings: Secondary | ICD-10-CM | POA: Diagnosis not present

## 2024-07-20 DIAGNOSIS — Z131 Encounter for screening for diabetes mellitus: Secondary | ICD-10-CM | POA: Diagnosis not present

## 2024-07-20 DIAGNOSIS — L02419 Cutaneous abscess of limb, unspecified: Secondary | ICD-10-CM

## 2024-07-20 DIAGNOSIS — Z1329 Encounter for screening for other suspected endocrine disorder: Secondary | ICD-10-CM

## 2024-07-20 DIAGNOSIS — Z130 Encounter for screening for diseases of the blood and blood-forming organs and certain disorders involving the immune mechanism: Secondary | ICD-10-CM

## 2024-07-20 DIAGNOSIS — H9190 Unspecified hearing loss, unspecified ear: Secondary | ICD-10-CM

## 2024-07-20 DIAGNOSIS — Z13220 Encounter for screening for lipoid disorders: Secondary | ICD-10-CM

## 2024-07-20 LAB — CBC
HCT: 40.5 % (ref 36.0–46.0)
Hemoglobin: 13.6 g/dL (ref 12.0–15.0)
MCHC: 33.5 g/dL (ref 30.0–36.0)
MCV: 85 fl (ref 78.0–100.0)
Platelets: 276 K/uL (ref 150.0–400.0)
RBC: 4.76 Mil/uL (ref 3.87–5.11)
RDW: 12.9 % (ref 11.5–15.5)
WBC: 6.5 K/uL (ref 4.0–10.5)

## 2024-07-20 LAB — LIPID PANEL
Cholesterol: 256 mg/dL — ABNORMAL HIGH (ref 0–200)
HDL: 50.1 mg/dL (ref >39.00)
LDL Cholesterol: 176 mg/dL — ABNORMAL HIGH (ref 0–99)
NonHDL: 205.75
Total CHOL/HDL Ratio: 5
Triglycerides: 149 mg/dL (ref 0.0–149.0)
VLDL: 29.8 mg/dL (ref 0.0–40.0)

## 2024-07-20 LAB — COMPREHENSIVE METABOLIC PANEL WITH GFR
ALT: 16 U/L (ref 0–35)
AST: 18 U/L (ref 0–37)
Albumin: 4.2 g/dL (ref 3.5–5.2)
Alkaline Phosphatase: 84 U/L (ref 39–117)
BUN: 11 mg/dL (ref 6–23)
CO2: 27 meq/L (ref 19–32)
Calcium: 8.9 mg/dL (ref 8.4–10.5)
Chloride: 103 meq/L (ref 96–112)
Creatinine, Ser: 0.7 mg/dL (ref 0.40–1.20)
GFR: 111.11 mL/min (ref >60.00)
Glucose, Bld: 85 mg/dL (ref 70–99)
Potassium: 4.3 meq/L (ref 3.5–5.1)
Sodium: 140 meq/L (ref 135–145)
Total Bilirubin: 0.5 mg/dL (ref 0.2–1.2)
Total Protein: 7.1 g/dL (ref 6.0–8.3)

## 2024-07-20 LAB — TSH: TSH: 2.92 u[IU]/mL (ref 0.35–5.50)

## 2024-07-20 LAB — HEMOGLOBIN A1C: Hgb A1c MFr Bld: 5.5 % (ref 4.6–6.5)

## 2024-07-20 MED ORDER — DOXYCYCLINE HYCLATE 100 MG PO CAPS: 100.0000 mg | ORAL_CAPSULE | Freq: Two times a day (BID) | ORAL | 0 refills | Status: AC

## 2024-07-21 ENCOUNTER — Encounter: Payer: Self-pay | Admitting: Family Medicine

## 2024-08-11 ENCOUNTER — Ambulatory Visit: Payer: Self-pay | Admitting: Family Medicine

## 2024-08-29 ENCOUNTER — Encounter: Payer: Self-pay | Admitting: Family Medicine

## 2024-08-30 MED ORDER — ONDANSETRON HCL 8 MG PO TABS: 8.0000 mg | ORAL_TABLET | Freq: Three times a day (TID) | ORAL | 0 refills | Status: AC | PRN

## 2024-10-05 NOTE — Progress Notes (Deleted)
 [facility] [address] [place], [place] [address] [phone]  Date:  10/08/2024   Name:  Patricia Morse   DOB:  [birth year]   MRN:  [id number]  PCP:  Watt Harlene BROCKS, MD    Chief Complaint: No chief complaint on file.   History of Present Illness:  Patricia Morse is a 38 y.o. very pleasant female patient who presents with the following:  Patient seen today with concern about a problem with her leg I saw her most recently for physical in August She has history of gestational hypertension, hearing loss, ADD dx in adulthood.  She is also being treated for possible hidradenitis suppurativa per dermatology She gave birth to her son Addie in February 2023 Discussed the use of AI scribe software for clinical note transcription with the patient, who gave verbal consent to proceed.  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   RESIDUAL ABSENCE OF KNEE REFLEX   Internal derangement of right knee    PONV (postoperative nausea and vomiting) POSTOP COCHLEAR  IMPLANT 2006   Pregnancy induced hypertension    Seasonal allergies     Past Surgical History:  Procedure Laterality Date   BILATERAL MIDDLE EAR EXPLORATION/ SEALING OF OVAL AND ROUND WINDOW FISTULAS  1999 (AGE 12)   PERILYMPH FISTULAS   CARPAL TUNNEL RELEASE Right 04/09/2023    Procedure: RIGHT CARPAL TUNNEL RELEASE;  Surgeon: Murrell Drivers, MD;  Location: [facility];  Service: Orthopedics;  Laterality: Right;  Bier block   CARPAL TUNNEL RELEASE Left 02/21/2024   Procedure: CARPAL TUNNEL RELEASE;  Surgeon: Murrell Drivers, MD;  Location: [facility];  Service: Orthopedics;  Laterality: Left;  30 MIN   CHONDROPLASTY  03/13/2012   Procedure: CHONDROPLASTY;  Surgeon: Donnice JONETTA Car, MD;  Location: [facility];  Service: Orthopedics;  Laterality: Right;   COCHLEAR IMPLANT  11-15-2005  (AGE 89)   LEFT EAR  (CLARION HIGH-RESOLUTION 90K MULTICHANNEL,  1-J ELECTRODE)   COCHLEAR IMPLANT Right 11/12/2018   Procedure: COCHLEAR IMPLANT RIGHT EAR;  Surgeon: Thaddeus Locus, MD;  Location: [facility];  Service: ENT;  Laterality: Right;   KNEE ARTHROSCOPY  03/13/2012   Procedure: ARTHROSCOPY KNEE;  Surgeon: Donnice JONETTA Car, MD;  Location: [facility];  Service: Orthopedics;  Laterality: Right;  RIGHT KNEE DIAGNOSTIC AND OPERATIVE SCOPE   WISDOM TOOTH EXTRACTION  AGE 41   DENTAL OFFICE    Social History   Tobacco Use   Smoking status: Former    Current packs/day: 0.00    Types: Cigarettes    Quit date: 03/10/2008    Years since quitting: 16.5   Smokeless tobacco: Never  Vaping Use   Vaping status: Never Used  Substance Use Topics  Alcohol use: Not Currently    Alcohol/week: 3.0 - 5.0 standard drinks of alcohol    Types: 3 - 5 Glasses of wine per week    Comment: OCCASIONAL, social    Drug use: No    Family History  Problem Relation Age of Onset   Arthritis Mother    Anxiety disorder Mother    Depression Mother    Hypertension Mother    Mental illness Mother    Hashimoto's thyroiditis Mother    Hypertension Father    Hyperlipidemia Father    Anxiety disorder Maternal Grandmother    Colon cancer Maternal Grandfather     Allergies  Allergen Reactions   Amoxicillin Hives and Rash   Contrast Media  [Iodinated Contrast Media] Itching    Swelling, high heart rate   Iodine  Anaphylaxis and Itching    Swelling, high heart rate   Hydrocodone      Other Reaction(s): Flushing   Hydrocodeine [Dihydrocodeine] Rash   Penicillins Rash    Medication list has been reviewed and updated.  Current Outpatient Medications on File Prior to Visit  Medication Sig Dispense Refill   ALPRAZolam  (XANAX ) 0.25 MG tablet Take 1 tablet (0.25 mg total) by mouth 2 (two) times daily as needed for anxiety. 20 tablet 0   calcium carbonate (TUMS - DOSED IN MG ELEMENTAL CALCIUM) 500 MG chewable tablet Chew 1 tablet by mouth daily as needed for indigestion or heartburn. (Patient not taking: Reported on 07/20/2024)     cetirizine (ZYRTEC) 10 MG tablet Take 10 mg by mouth daily.     doxycycline  (VIBRAMYCIN ) 100 MG capsule Take 1 capsule (100 mg total) by mouth 2 (two) times daily. 20 capsule 0   DULoxetine  (CYMBALTA ) 30 MG capsule Take 1 capsule (30 mg total) by mouth daily. Take with 60 mg of cymbalta  to equal 90 mg total (Patient taking differently: Take 60 mg by mouth daily. Take with 60 mg of cymbalta  to equal 90 mg total) 30 capsule 0   fluticasone  (FLONASE ) 50 MCG/ACT nasal spray Place into both nostrils daily. (Patient not taking: Reported on 07/20/2024)     methylphenidate 27 MG PO CR tablet Take 27 mg by mouth every morning.     ondansetron  (ZOFRAN ) 8 MG tablet Take 1 tablet (8 mg total) by mouth every 8 (eight) hours as needed for nausea or vomiting. 30 tablet 0   Probiotic Product (PROBIOTIC DAILY PO) Take 1 tablet by mouth daily.     traMADol  (ULTRAM ) 50 MG tablet Take 1 tablet (50 mg total) by mouth every 6 (six) hours as needed. (Patient not taking: Reported on 07/20/2024) 20 tablet 0   valACYclovir  (VALTREX ) 1000 MG tablet Use as directed for suppression 90 tablet 3   No current facility-administered medications on file prior to visit.    Review of Systems:  As per HPI- otherwise negative.   Physical  Examination: There were no vitals filed for this visit. There were no vitals filed for this visit. There is no height or weight on file to calculate BMI. Ideal Body Weight:    GEN: no acute distress. HEENT: Atraumatic, Normocephalic.  Ears and Nose: No external deformity. CV: RRR, No M/G/R. No JVD. No thrill. No extra heart sounds. PULM: CTA B, no wheezes, crackles, rhonchi. No retractions. No resp. distress. No accessory muscle use. ABD: S, NT, ND, +BS. No rebound. No HSM. EXTR: No c/c/e PSYCH: Normally interactive. Conversant.    Assessment and Plan: No diagnosis found.  Assessment & Plan  Signed Harlene Schroeder, MD

## 2024-10-08 ENCOUNTER — Ambulatory Visit: Admitting: Family Medicine

## 2024-11-10 ENCOUNTER — Telehealth: Admitting: Family Medicine

## 2024-11-10 DIAGNOSIS — J40 Bronchitis, not specified as acute or chronic: Secondary | ICD-10-CM

## 2024-11-10 MED ORDER — AZITHROMYCIN 250 MG PO TABS: ORAL_TABLET | ORAL | 0 refills | Status: AC

## 2024-11-10 MED ORDER — BENZONATATE 100 MG PO CAPS: 100.0000 mg | ORAL_CAPSULE | Freq: Three times a day (TID) | ORAL | 0 refills | Status: AC | PRN

## 2024-11-10 NOTE — Progress Notes (Signed)
 We are sorry that you are not feeling well.  Here is how we plan to help!  Based on your presentation I believe you most likely have A cough due to bacteria.  When patients have a fever and a productive cough with a change in color or increased sputum production, we are concerned about bacterial bronchitis.  If left untreated it can progress to pneumonia.  If your symptoms do not improve with your treatment plan it is important that you contact your provider.   I have prescribed Azithromyin 250 mg: two tablets now and then one tablet daily for 4 additonal days    In addition you may use A prescription cough medication called Tessalon  Perles 100mg . You may take 1-2 capsules every 8 hours as needed for your cough.   From your responses in the eVisit questionnaire you describe inflammation in the upper respiratory tract which is causing a significant cough.  This is commonly called Bronchitis and has four common causes:   Allergies Viral Infections Acid Reflux Bacterial Infection Allergies, viruses and acid reflux are treated by controlling symptoms or eliminating the cause. An example might be a cough caused by taking certain blood pressure medications. You stop the cough by changing the medication. Another example might be a cough caused by acid reflux. Controlling the reflux helps control the cough.  USE OF BRONCHODILATOR (RESCUE) INHALERS: There is a risk from using your bronchodilator too frequently.  The risk is that over-reliance on a medication which only relaxes the muscles surrounding the breathing tubes can reduce the effectiveness of medications prescribed to reduce swelling and congestion of the tubes themselves.  Although you feel brief relief from the bronchodilator inhaler, your asthma may actually be worsening with the tubes becoming more swollen and filled with mucus.  This can delay other crucial treatments, such as oral steroid medications. If you need to use a bronchodilator  inhaler daily, several times per day, you should discuss this with your provider.  There are probably better treatments that could be used to keep your asthma under control.     HOME CARE Only take medications as instructed by your medical team. Complete the entire course of an antibiotic. Drink plenty of fluids and get plenty of rest. Avoid close contacts especially the very young and the elderly Cover your mouth if you cough or cough into your sleeve. Always remember to wash your hands A steam or ultrasonic humidifier can help congestion.   GET HELP RIGHT AWAY IF: You develop worsening fever. You become short of breath You cough up blood. Your symptoms persist after you have completed your treatment plan MAKE SURE YOU  Understand these instructions. Will watch your condition. Will get help right away if you are not doing well or get worse.  Your e-visit answers were reviewed by a board certified advanced clinical practitioner to complete your personal care plan.  Depending on the condition, your plan could have included both over the counter or prescription medications. If there is a problem please reply  once you have received a response from your provider. Your safety is important to us .  If you have drug allergies check your prescription carefully.    You can use MyChart to ask questions about todays visit, request a non-urgent call back, or ask for a work or school excuse for 24 hours related to this e-Visit. If it has been greater than 24 hours you will need to follow up with your provider, or enter a new e-Visit  to address those concerns. You will get an e-mail in the next two days asking about your experience.  I hope that your e-visit has been valuable and will speed your recovery. Thank you for using e-visits.   I have spent 5 minutes in review of e-visit questionnaire, review and updating patient chart, medical decision making and response to patient.   Lauraine Kitty, FNP

## 2024-11-14 NOTE — Progress Notes (Unsigned)
 [facility] [address], [address] [place], [place] [address] [phone]  Date:  11/16/2024   Name:  Patricia Morse   DOB:  1986/03/15   MRN:  [id number]  PCP:  Watt Harlene BROCKS, MD    Chief Complaint: No chief complaint on file.   History of Present Illness:  Patricia Morse is a 38 y.o. very pleasant female patient who presents with the following:  Pt seen today with some concerns Last seen by myself in August for CPE She has history of gestational hypertension, hearing loss, ADD dx in adulthood.  She is also being treated for possible hidradenitis suppurativa per dermatology She gave birth to her son Addie in February 2023  Discussed the use of AI scribe software for clinical note transcription with the patient, who gave verbal consent to proceed.  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   RESIDUAL ABSENCE OF KNEE REFLEX   Internal derangement of right knee    PONV (postoperative nausea and vomiting) POSTOP COCHLEAR  IMPLANT 2006   Pregnancy induced hypertension    Seasonal allergies     Past Surgical History:  Procedure Laterality Date   BILATERAL MIDDLE EAR EXPLORATION/ SEALING OF OVAL AND ROUND WINDOW FISTULAS  1999 (AGE 46)   PERILYMPH FISTULAS   CARPAL TUNNEL RELEASE Right 04/09/2023   Procedure: RIGHT CARPAL TUNNEL RELEASE;   Surgeon: Murrell Drivers, MD;  Location: [facility];  Service: Orthopedics;  Laterality: Right;  Bier block   CARPAL TUNNEL RELEASE Left 02/21/2024   Procedure: CARPAL TUNNEL RELEASE;  Surgeon: Murrell Drivers, MD;  Location: [facility];  Service: Orthopedics;  Laterality: Left;  30 MIN   CHONDROPLASTY  03/13/2012   Procedure: CHONDROPLASTY;  Surgeon: Donnice JONETTA Car, MD;  Location: [facility];  Service: Orthopedics;  Laterality: Right;   COCHLEAR IMPLANT  11-15-2005  (AGE 19)   LEFT EAR  (CLARION HIGH-RESOLUTION 90K MULTICHANNEL,  1-J ELECTRODE)   COCHLEAR IMPLANT Right 11/12/2018   Procedure: COCHLEAR IMPLANT RIGHT EAR;  Surgeon: Thaddeus Locus, MD;  Location: [facility];  Service: ENT;  Laterality: Right;   KNEE ARTHROSCOPY  03/13/2012   Procedure: ARTHROSCOPY KNEE;  Surgeon: Donnice JONETTA Car, MD;  Location: [facility];  Service: Orthopedics;  Laterality: Right;  RIGHT KNEE DIAGNOSTIC AND OPERATIVE SCOPE   WISDOM TOOTH EXTRACTION  AGE 80   DENTAL OFFICE    Social History[1]  Family History  Problem Relation Age of Onset   Arthritis Mother    Anxiety disorder Mother    Depression Mother    Hypertension Mother    Mental illness Mother    Hashimoto's thyroiditis Mother    Hypertension Father    Hyperlipidemia Father    Anxiety disorder Maternal  Grandmother    Colon cancer Maternal Grandfather     Allergies[2]  Medication list has been reviewed and updated.  Medications Ordered Prior to Encounter[3]  Review of Systems:  As per HPI- otherwise negative.   Physical Examination: There were no vitals filed for this visit. There were no vitals filed for this visit. There is no height or weight on file to calculate BMI. Ideal Body Weight:    GEN: no acute distress. HEENT: Atraumatic, Normocephalic.  Ears and Nose: No external deformity. CV: RRR, No M/G/R. No JVD. No thrill. No extra heart sounds. PULM: CTA B,  no wheezes, crackles, rhonchi. No retractions. No resp. distress. No accessory muscle use. ABD: S, NT, ND, +BS. No rebound. No HSM. EXTR: No c/c/e PSYCH: Normally interactive. Conversant.    Assessment and Plan: No diagnosis found.  Assessment & Plan   Signed Harlene Schroeder, MD    [1]  Social History Tobacco Use   Smoking status: Former    Current packs/day: 0.00    Types: Cigarettes    Quit date: 03/10/2008    Years since quitting: 16.6   Smokeless tobacco: Never  Vaping Use   Vaping status: Never Used  Substance Use Topics   Alcohol use: Not Currently    Alcohol/week: 3.0 - 5.0 standard drinks of alcohol    Types: 3 - 5 Glasses of wine per week    Comment: OCCASIONAL, social    Drug use: No  [2]  Allergies Allergen Reactions   Amoxicillin Hives and Rash   Contrast Media [Iodinated Contrast Media] Itching    Swelling, high heart rate   Iodine  Anaphylaxis and Itching    Swelling, high heart rate   Hydrocodone      Other Reaction(s): Flushing   Hydrocodeine [Dihydrocodeine] Rash   Penicillins Rash  [3]  Current Outpatient Medications on File Prior to Visit  Medication Sig Dispense Refill   ALPRAZolam  (XANAX ) 0.25 MG tablet Take 1 tablet (0.25 mg total) by mouth 2 (two) times daily as needed for anxiety. 20 tablet 0   azithromycin  (ZITHROMAX ) 250 MG tablet Take 2 tablets on day 1, then 1 tablet daily on days 2 through 5 6 tablet 0   benzonatate  (TESSALON ) 100 MG capsule Take 1 capsule (100 mg total) by mouth 3 (three) times daily as needed. 30 capsule 0   calcium carbonate (TUMS - DOSED IN MG ELEMENTAL CALCIUM) 500 MG chewable tablet Chew 1 tablet by mouth daily as needed for indigestion or heartburn. (Patient not taking: Reported on 07/20/2024)     cetirizine (ZYRTEC) 10 MG tablet Take 10 mg by mouth daily.     doxycycline  (VIBRAMYCIN ) 100 MG capsule Take 1 capsule (100 mg total) by mouth 2 (two) times daily. 20 capsule 0   DULoxetine  (CYMBALTA ) 30 MG capsule Take  1 capsule (30 mg total) by mouth daily. Take with 60 mg of cymbalta  to equal 90 mg total (Patient taking differently: Take 60 mg by mouth daily. Take with 60 mg of cymbalta  to equal 90 mg total) 30 capsule 0   fluticasone  (FLONASE ) 50 MCG/ACT nasal spray Place into both nostrils daily. (Patient not taking: Reported on 07/20/2024)     methylphenidate 27 MG PO CR tablet Take 27 mg by mouth every morning.     ondansetron  (ZOFRAN ) 8 MG tablet Take 1 tablet (8 mg total) by mouth every 8 (eight) hours as needed for nausea or vomiting. 30 tablet 0   Probiotic Product (PROBIOTIC DAILY PO) Take 1 tablet by mouth  daily.     traMADol  (ULTRAM ) 50 MG tablet Take 1 tablet (50 mg total) by mouth every 6 (six) hours as needed. (Patient not taking: Reported on 07/20/2024) 20 tablet 0   valACYclovir  (VALTREX ) 1000 MG tablet Use as directed for suppression 90 tablet 3   No current facility-administered medications on file prior to visit.   "

## 2024-11-16 ENCOUNTER — Ambulatory Visit: Admitting: Family Medicine

## 2024-11-16 ENCOUNTER — Telehealth: Payer: Self-pay | Admitting: *Deleted

## 2024-11-16 ENCOUNTER — Encounter: Payer: Self-pay | Admitting: Family Medicine

## 2024-11-16 VITALS — BP 136/88 | HR 90 | Ht 65.0 in | Wt 218.0 lb

## 2024-11-16 DIAGNOSIS — Z789 Other specified health status: Secondary | ICD-10-CM

## 2024-11-16 NOTE — Telephone Encounter (Signed)
 Pt was seen in office today. Genesight test was collected/ordered. Sample was packaged and placed up front for [employer] to pick up 11/17/24. Consent and insurance info were in the package.   Confirmation number for pickup is [id number]   Tracking number (on package): [id number]

## 2024-11-25 ENCOUNTER — Encounter: Payer: Self-pay | Admitting: Family Medicine

## 2024-12-03 ENCOUNTER — Encounter: Payer: Self-pay | Admitting: Family Medicine

## 2025-01-14 ENCOUNTER — Ambulatory Visit: Admitting: Family Medicine
